# Patient Record
Sex: Male | Born: 1955 | Race: White | Hispanic: No | Marital: Married | Smoking: Former smoker
Health system: Southern US, Community
[De-identification: ages and names within clinical notes are randomized; demographics above are authoritative.]

## PROBLEM LIST (undated history)

## (undated) DIAGNOSIS — K219 Gastro-esophageal reflux disease without esophagitis: Secondary | ICD-10-CM

## (undated) DIAGNOSIS — K2270 Barrett's esophagus without dysplasia: Secondary | ICD-10-CM

## (undated) DIAGNOSIS — Z9229 Personal history of other drug therapy: Secondary | ICD-10-CM

## (undated) DIAGNOSIS — M25531 Pain in right wrist: Secondary | ICD-10-CM

## (undated) DIAGNOSIS — K432 Incisional hernia without obstruction or gangrene: Secondary | ICD-10-CM

## (undated) DIAGNOSIS — M810 Age-related osteoporosis without current pathological fracture: Secondary | ICD-10-CM

## (undated) DIAGNOSIS — F419 Anxiety disorder, unspecified: Secondary | ICD-10-CM

## (undated) DIAGNOSIS — M79672 Pain in left foot: Secondary | ICD-10-CM

## (undated) DIAGNOSIS — J302 Other seasonal allergic rhinitis: Secondary | ICD-10-CM

## (undated) DIAGNOSIS — Z8781 Personal history of (healed) traumatic fracture: Secondary | ICD-10-CM

## (undated) DIAGNOSIS — Z961 Presence of intraocular lens: Secondary | ICD-10-CM

## (undated) DIAGNOSIS — J3089 Other allergic rhinitis: Secondary | ICD-10-CM

## (undated) DIAGNOSIS — K56609 Unspecified intestinal obstruction, unspecified as to partial versus complete obstruction: Secondary | ICD-10-CM

## (undated) DIAGNOSIS — G43909 Migraine, unspecified, not intractable, without status migrainosus: Secondary | ICD-10-CM

## (undated) DIAGNOSIS — K5909 Other constipation: Secondary | ICD-10-CM

## (undated) DIAGNOSIS — Z86010 Personal history of colonic polyps: Secondary | ICD-10-CM

## (undated) DIAGNOSIS — J449 Chronic obstructive pulmonary disease, unspecified: Secondary | ICD-10-CM

## (undated) DIAGNOSIS — T7840XA Allergy, unspecified, initial encounter: Secondary | ICD-10-CM

## (undated) DIAGNOSIS — M4850XA Collapsed vertebra, not elsewhere classified, site unspecified, initial encounter for fracture: Secondary | ICD-10-CM

## (undated) DIAGNOSIS — L259 Unspecified contact dermatitis, unspecified cause: Secondary | ICD-10-CM

## (undated) DIAGNOSIS — N503 Cyst of epididymis: Secondary | ICD-10-CM

## (undated) DIAGNOSIS — E785 Hyperlipidemia, unspecified: Secondary | ICD-10-CM

## (undated) DIAGNOSIS — J45909 Unspecified asthma, uncomplicated: Secondary | ICD-10-CM

## (undated) HISTORY — DX: Hyperlipidemia, unspecified: E78.5

## (undated) HISTORY — DX: Allergy, unspecified, initial encounter: T78.40XA

## (undated) HISTORY — DX: Presence of intraocular lens: Z96.1

## (undated) HISTORY — DX: Gastro-esophageal reflux disease without esophagitis: K21.9

## (undated) HISTORY — DX: Other allergic rhinitis: J30.89

## (undated) HISTORY — DX: Cyst of epididymis: N50.3

## (undated) HISTORY — DX: Other constipation: K59.09

## (undated) HISTORY — DX: Migraine, unspecified, not intractable, without status migrainosus: G43.909

## (undated) HISTORY — DX: Age-related osteoporosis without current pathological fracture: M81.0

## (undated) HISTORY — DX: Other seasonal allergic rhinitis: J30.2

## (undated) HISTORY — PX: COLONOSCOPY W/ POLYPECTOMY: SHX1380

## (undated) HISTORY — DX: Anxiety disorder, unspecified: F41.9

## (undated) HISTORY — DX: Pain in left foot: M79.672

## (undated) HISTORY — DX: Unspecified asthma, uncomplicated: J45.909

## (undated) HISTORY — DX: Personal history of colonic polyps: Z86.010

## (undated) HISTORY — PX: ESOPHAGOGASTRODUODENOSCOPY: SHX1529

## (undated) HISTORY — PX: ABDOMINAL EXPLORATION SURGERY: SHX538

## (undated) HISTORY — DX: Unspecified contact dermatitis, unspecified cause: L25.9

## (undated) HISTORY — DX: Barrett's esophagus without dysplasia: K22.70

## (undated) HISTORY — DX: Pain in right wrist: M25.531

## (undated) HISTORY — DX: Unspecified intestinal obstruction, unspecified as to partial versus complete obstruction: K56.609

---
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
## 1999-12-19 ENCOUNTER — Ambulatory Visit (HOSPITAL_COMMUNITY): Admission: RE | Admit: 1999-12-19 | Discharge: 1999-12-19 | Payer: Self-pay

## 2004-12-09 ENCOUNTER — Ambulatory Visit: Payer: Self-pay | Admitting: Internal Medicine

## 2005-05-07 ENCOUNTER — Ambulatory Visit: Payer: Self-pay | Admitting: Internal Medicine

## 2005-07-14 ENCOUNTER — Ambulatory Visit: Payer: Self-pay | Admitting: Internal Medicine

## 2005-09-17 ENCOUNTER — Ambulatory Visit: Payer: Self-pay | Admitting: Internal Medicine

## 2006-02-04 ENCOUNTER — Ambulatory Visit: Payer: Self-pay | Admitting: Internal Medicine

## 2006-06-09 ENCOUNTER — Ambulatory Visit: Payer: Self-pay | Admitting: Internal Medicine

## 2006-07-06 ENCOUNTER — Ambulatory Visit: Payer: Self-pay | Admitting: Internal Medicine

## 2006-08-10 ENCOUNTER — Encounter (INDEPENDENT_AMBULATORY_CARE_PROVIDER_SITE_OTHER): Payer: Self-pay | Admitting: Specialist

## 2006-08-10 ENCOUNTER — Ambulatory Visit: Payer: Self-pay | Admitting: Internal Medicine

## 2006-08-10 DIAGNOSIS — Z860101 Personal history of adenomatous and serrated colon polyps: Secondary | ICD-10-CM

## 2006-08-10 DIAGNOSIS — Z86010 Personal history of colonic polyps: Secondary | ICD-10-CM

## 2006-08-10 HISTORY — DX: Personal history of colonic polyps: Z86.010

## 2006-08-10 HISTORY — DX: Personal history of adenomatous and serrated colon polyps: Z86.0101

## 2006-09-07 ENCOUNTER — Ambulatory Visit: Payer: Self-pay | Admitting: Internal Medicine

## 2006-11-01 ENCOUNTER — Ambulatory Visit: Payer: Self-pay | Admitting: Internal Medicine

## 2006-11-02 ENCOUNTER — Ambulatory Visit: Payer: Self-pay | Admitting: Internal Medicine

## 2007-01-05 ENCOUNTER — Ambulatory Visit: Payer: Self-pay | Admitting: Internal Medicine

## 2007-01-10 ENCOUNTER — Ambulatory Visit: Payer: Self-pay | Admitting: Internal Medicine

## 2007-02-01 ENCOUNTER — Ambulatory Visit: Payer: Self-pay | Admitting: Internal Medicine

## 2007-02-21 ENCOUNTER — Ambulatory Visit: Payer: Self-pay | Admitting: Internal Medicine

## 2007-03-15 ENCOUNTER — Ambulatory Visit: Payer: Self-pay | Admitting: Internal Medicine

## 2007-07-20 ENCOUNTER — Ambulatory Visit: Payer: Self-pay | Admitting: Internal Medicine

## 2007-10-10 ENCOUNTER — Ambulatory Visit: Payer: Self-pay | Admitting: Internal Medicine

## 2007-12-15 ENCOUNTER — Ambulatory Visit: Payer: Self-pay | Admitting: Internal Medicine

## 2008-02-21 DIAGNOSIS — R5383 Other fatigue: Secondary | ICD-10-CM | POA: Insufficient documentation

## 2008-02-23 ENCOUNTER — Encounter: Admission: RE | Admit: 2008-02-23 | Discharge: 2008-02-23 | Payer: Self-pay | Admitting: Family Medicine

## 2008-06-01 ENCOUNTER — Telehealth: Payer: Self-pay | Admitting: Internal Medicine

## 2008-06-04 ENCOUNTER — Telehealth: Payer: Self-pay | Admitting: Internal Medicine

## 2008-06-05 ENCOUNTER — Ambulatory Visit: Payer: Self-pay | Admitting: Internal Medicine

## 2008-06-05 DIAGNOSIS — L309 Dermatitis, unspecified: Secondary | ICD-10-CM | POA: Insufficient documentation

## 2008-06-05 DIAGNOSIS — J302 Other seasonal allergic rhinitis: Secondary | ICD-10-CM | POA: Insufficient documentation

## 2008-06-05 DIAGNOSIS — K219 Gastro-esophageal reflux disease without esophagitis: Secondary | ICD-10-CM | POA: Insufficient documentation

## 2008-06-05 DIAGNOSIS — J3089 Other allergic rhinitis: Secondary | ICD-10-CM

## 2008-06-06 ENCOUNTER — Ambulatory Visit: Payer: Self-pay | Admitting: Internal Medicine

## 2008-07-03 ENCOUNTER — Ambulatory Visit: Payer: Self-pay | Admitting: Internal Medicine

## 2008-07-20 ENCOUNTER — Encounter: Payer: Self-pay | Admitting: Internal Medicine

## 2008-07-20 ENCOUNTER — Ambulatory Visit: Payer: Self-pay | Admitting: Internal Medicine

## 2008-07-23 ENCOUNTER — Encounter: Payer: Self-pay | Admitting: Internal Medicine

## 2008-11-07 ENCOUNTER — Ambulatory Visit: Payer: Self-pay | Admitting: Internal Medicine

## 2009-05-01 ENCOUNTER — Ambulatory Visit: Payer: Self-pay | Admitting: Internal Medicine

## 2009-09-30 ENCOUNTER — Ambulatory Visit: Payer: Self-pay | Admitting: Internal Medicine

## 2009-10-22 ENCOUNTER — Ambulatory Visit: Payer: Self-pay | Admitting: Internal Medicine

## 2010-07-16 ENCOUNTER — Ambulatory Visit: Payer: Self-pay | Admitting: Internal Medicine

## 2010-09-22 ENCOUNTER — Ambulatory Visit: Payer: Self-pay | Admitting: Internal Medicine

## 2010-11-06 NOTE — Assessment & Plan Note (Signed)
Summary: 12 months/apc   CC:  12 month f/u. pt has no new concerns.  History of Present Illness: History of Present Illness: 2009 55 yo man returning for allergy f/u with hx of allergic rhinitis and eczema.Marland KitchenHe continues to give his own allergy vaccine at 1:50 without problems.No major symptom flares, but more postnasal drip during peak spring pollen.We discussed allergy vaccine risks, goals and alternatives agian, and discussed the epipen. Eczema is controlled. Denies wheezing.  October 03, 2009- Allergic rhinitis, eczema Credits a hydrating skin cream from Dr Jorja Loa with improved eczema. A sunblocker also helps. "[employer] and [employer]" sweat resistant . Does well with allergy vaccine.  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  September 22, 2010- Allergic rhinitis, eczema Nurse-CC: 12 month f/u. pt has no new concerns Misses an occasional allergy shot , but on balance has been very consistent and feels they help. He takes occasional Sudafed and dropped off Astepro. 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       Preventive Screening-Counseling & Management  Alcohol-Tobacco     Smoking Status: quit  Current Medications (verified): 1)  Advil 200 Mg Tabs (Ibuprofen) .... Take As Directed As Needed 2)  Epipen 0.3 Mg/0.60ml (1:1000) Devi (Epinephrine Hcl (Anaphylaxis)) .... For Severe Allergic Reaction 3)  Allergy Vaccine Go (W-E) 150 4)  Prevacid 30 Mg Cpdr (Lansoprazole) .Marland Kitchen.. 1 Capsule By Mouth Two Times A Day 5)  Astepro 0.15 % Soln (Azelastine Hcl) .Marland Kitchen.. 1-2 Sprays Each Nostril Up To Twice Daily As Needed  Allergies (verified): No Known Drug Allergies  Past History:  Past Medical History: Last updated: 06/05/2008 GERD (ICD-530.81) ALLERGIC RHINITIS (ICD-477.9) ECZEMA (ICD-692.9)  Past Surgical History: Last updated: 10/03/09 Right knee arthroscopy  Family History: Last updated: 10/03/2009 Father- died  chemical pneumonia siphoning gas. 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 remote  Risk Factors: Smoking Status: quit (09/22/2010)  Review of Systems      See HPI       The patient complains of shortness of breath with activity, non-productive cough, headaches, and nasal congestion/difficulty breathing through nose.  The patient denies shortness of breath at rest, productive cough, coughing up blood, chest pain, irregular heartbeats, acid heartburn, indigestion, loss of appetite, weight change, abdominal pain, difficulty swallowing, sore throat, tooth/dental problems, sneezing, itching, ear ache, rash, change in color of mucus, and fever.    Vital Signs:  Patient profile:   55 year old male Height:      70 inches Weight:      150.38 pounds BMI:     21.66 O2 Sat:      99 % on Room air Pulse rate:   109 / minute BP sitting:   144 / 84  (left arm) Cuff size:   regular  Vitals Entered By: Carver Fila (September 22, 2010 11:27 AM)  O2 Flow:  Room air CC: 12 month f/u. pt has no new concerns Comments meds and allergies updated Phone number updated Carver Fila  September 22, 2010 11:27 AM    Physical Exam  Additional Exam:  General: A/Ox3; pleasant and cooperative, NAD, thin, active, trim SKIN: Chronically eczematoid skin NODES: no lymphadenopathy HEENT: [place]/AT, EOM- WNL, Conjuctivae- clear, PERRLA, TM-WNL, Nose- clear, Throat- clear and wnl NECK: Supple w/ fair ROM, JVD- none, normal carotid impulses w/o bruits Thyroid-  CHEST: Clear to P&A HEART: RRR, no m/g/r heard  ABDOMEN: Soft and nl EAV:WUJW, nl pulses, no edema  NEURO: Grossly intact to observation      Impression & Recommendations:  Problem # 1:  ALLERGIC RHINITIS (ICD-477.9)  Discussed continuation of allergy vaccine. Compared Astepro with decongestants. Gave print information and explained use of Neti pot.  His updated  medication list for this problem includes:    Astepro 0.15 % Soln (Azelastine hcl) .Marland Kitchen... 1-2 sprays each nostril up to twice daily as needed  Problem # 2:  ECZEMA (ICD-692.9) He seems fairly well controlled with chronic and scarring changes still apparent on his face and hands. Orders: Est. Patient Level III (11914)  Problem # 3:  GERD (ICD-530.81) We cautoined maintenance of anti reflux measures to avoid complicating his asthma. His updated medication list for this problem includes:    Prevacid 30 Mg Cpdr (Lansoprazole) .Marland Kitchen... 1 capsule by mouth two times a day  Medications Added to Medication List This Visit: 1)  Allergy Vaccine Go (w-e) 1:10   Patient Instructions: 1)  Please schedule a follow-up appointment in 6 months. 2)  Continue allergy vaccine 3)  Refill Epipen 4)  Consider trying Neti pot techinque for saline rinse.  Prescriptions: EPIPEN 0.3 MG/0.3ML (1:1000) DEVI (EPINEPHRINE HCL (ANAPHYLAXIS)) For severe allergic reaction  #1 x prn   Entered and Authorized by:   Waymon Budge MD   Signed by:   Waymon Budge MD on 09/22/2010   Method used:   Print then Give to Patient   RxID:   [id number]

## 2011-02-12 ENCOUNTER — Ambulatory Visit (INDEPENDENT_AMBULATORY_CARE_PROVIDER_SITE_OTHER): Payer: Self-pay

## 2011-02-12 DIAGNOSIS — J309 Allergic rhinitis, unspecified: Secondary | ICD-10-CM

## 2011-02-17 NOTE — Assessment & Plan Note (Signed)
Jac[facility] Blanch[facility]                         GASTROENTEROLOGY OFFICE NOTE   Jacob Blanchard, Jacob Blanchard                     MRN:          [id number]  DATE:10/10/2007                            DOB:          Apr 24, 1956    HISTORY:  Jacob Blanchard presents today for followup. He has a history of  gastroesophageal reflux disease complicated by short-segment Barrett's  esophagus as well as adenomatous colon polyps. He last underwent upper  endoscopy and colonoscopy August 10, 2006. Colonoscopy revealed  diminutive colon polyps which were adenomatous. Followup recommended in  5 years. Also left-sided diverticulosis. Upper endoscopy revealed short-  segment Barrett's esophagus and a sliding hiatal hernia. He has been on  Prevacid 30 mg daily with excellent control of reflux symptoms. No  dysphagia.   CURRENT MEDICATIONS:  Allergy shots, eye drops, Prevacid, verapamil,  Depakote, melatonin.   PHYSICAL EXAMINATION:  GENERAL:  Well-appearing male in no acute  distress.  VITAL SIGNS:  Blood pressure is 120/78, heart rate is 88, weight is 157  pounds.  HEENT:  Sclera anicteric.  LUNGS:  Clear.  HEART:  Regular.  ABDOMEN:  Soft without tenderness, mass or hernia.   IMPRESSION:  1. Gastroesophageal reflux disease complicated by Barrett's esophagus.      Previous endoscopy negative for dysplasia.  2. Adenomatous colon polyps.  3. Diverticulosis.   RECOMMENDATIONS:  1. Continue Prevacid 30 mg daily. A prescription with multiple refills      has been provided.  2. Schedule upper endoscopy with surveillance biopsies. The nature of      the procedure as well as the risks, benefits, and alternatives were      reviewed. He understood and agreed to proceed.  3. Surveillance colonoscopy due in 4 years.     Jacob Blanchard. Jacob Goodell, MD  Electronically Signed    JNP/MedQ  DD: 10/10/2007  DT: 10/11/2007  Job #: [id number]

## 2011-02-20 NOTE — Assessment & Plan Note (Signed)
[name]                           GASTROENTEROLOGY OFFICE NOTE   Jacob Blanchard, Jacob Blanchard                     MRN:          [id number]  DATE:07/06/2006                            DOB:          [birth year]    REASON FOR CONSULTATION:  Belching.   HISTORY OF PRESENT ILLNESS:  This is a 55 year old white male with a history  of sinus and allergy problems who presents himself for evaluation of  belching as well as some other abdominal complaints.  The patient reports  being in his usual state of good health until July 2007 when he was having  problems with belching and chest pain.  He described recurrent episodes of  burping, as well, chest discomfort in the left shoulder that was  reproducibly improved with belching.  He was evaluated at Urgent Care,  apparently underwent evaluation including EKG.  He was told that his  complaints were noncardiac.  He was also tested for Helicobacter pylori,  which according to the patient, returned normal.  He was treated with  Prevacid for one month.  He does have chronic problems with heartburn and  indigestion for which the Prevacid helped.  However, there was no impact on  his belching.  No true dysphagia, more of a fullness in the pharyngeal area  that he thinks may be related to his allergies.  He also reports that the  Prevacid caused some constipation with associated left lower quadrant  discomfort.  He was treated with Metamucil and Glycolax.  This helped.  Though his weight has been stable, he states he has lost about 5 pounds in  the past few months.  He does chew gum on a regular basis, though, does not  use sugar substitutes in any form.  He has not had prior GI evaluations for  these problems, but states he had a sigmoidoscopy many years previous.   PAST MEDICAL HISTORY:  Sinus and allergy troubles.   PAST SURGICAL HISTORY:  Right knee surgery.   CURRENT MEDICATIONS:  1. Glycolax p.r.n.  2.  Metamucil p.r.n.   ALLERGIES:  No known drug allergies.   FAMILY HISTORY:  Negative for gastrointestinal malignancy.   SOCIAL HISTORY:  The patient is married with a 78 year old daughter.  His  wife, Jacob Blanchard, is a patient of mine.  He rarely smokes and drinks a few  beers at night.  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   REVIEW OF SYSTEMS:  Per diagnostic evaluation form.   PHYSICAL EXAMINATION:  GENERAL APPEARANCE:  Well-appearing male in no acute  distress.  VITAL SIGNS:  Blood pressure 110/66, heart rate 68 and regular, weight 143.2  pounds, 6 feet in height.  HEENT:  Sclerae are anicteric.  Conjunctivae are pink.  Oral mucosa intact.  No adenopathy  LUNGS:  Clear.  HEART:  Regular.  ABDOMEN:  Soft without tenderness, mass or hernia.  Good bowel sounds heard.  EXTREMITIES:  Without edema.   IMPRESSION:  1. Chronic belching, likely due to aerophagia.  2. Chronic reflux disease.  Symptoms were improved on proton pump  inhibitor therapy and have returned off proton pump inhibitor therapy.      No alarm symptoms.  Rule out Barrett's esophagus.  3. Constipation, improved with Metamucil and Glycolax.  4. Colon cancer screening, appropriate candidate without contraindication.   RECOMMENDATIONS:  Colonoscopy with polypectomy if necessary and upper  endoscopy.  The nature of both procedures as well as risks, benefits,  alternatives were reviewed.  He understood and agreed to proceed.            ______________________________  Wilhemina Bonito. Eda Keys., MD      JNP/MedQ  DD:  07/06/2006  DT:  07/07/2006  Job #:  [id number]

## 2011-02-20 NOTE — Assessment & Plan Note (Signed)
[facility]                             PULMONARY OFFICE NOTE   DELANEY, SCHNICK                     MRN:          [id number]  DATE:11/01/2006                            DOB:          [birth year]    PROBLEMS:  1. Eczema.  2. Allergic rhinitis.  3. Chronic reflux disease with aerophagia and belching.   HISTORY:  He continues allergy vaccine at 1:10 with no problems.  We  have reviewed risk issues, including consideration of administration  outside of a medical office, anaphylaxis and epinephrine.  He has an  epinephrine pen, which he has never needed.  He works with Dr. Jorja Loa on  his eczema and is using halobetasol cream.  He continues allergy vaccine  and says he has been on it now for years with no problems at all.  He  does not request a change and implies that it has helped.   MEDICATIONS:  1. Allergy vaccine.  2. Prevacid.  3. Halobetasol 0.05% cream.  4. P.R.N. use of Zomig, Imitrex, Sudafed, and cyclobenzaprine.   No medication allergy.   OBJECTIVE:  VITAL SIGNS:  Weight 150 pounds.  BP 138/72, pulse regular  at 100, room air saturation 100%.  SKIN:  He shows me small, reddened areas on his skin.  The skin of his  face looks mildly hyperkeratotic without peeling.  HEENT:  Nasal mucosa seems dry with crusting mucus.  There is mild  septal deviation.  No stridor.  No postnasal drip.  LUNGS:  Clear.  HEART:  Heart sounds are regular without murmur.   IMPRESSION:  Allergic rhinitis, eczema.   PLAN:  I have offered a trial of Veramyst nasal spray once each nostril  daily.  He will use Mucinex and nasal saline lavage.  Continue allergy  vaccine at 1:10 but after this length of time, we both agreed we are  going to bring him back when able for allergy retesting to make sure his  vaccine mix remains appropriate.     Clinton D. Maple Hudson, MD, Tonny Bollman, FACP  Electronically Signed   CDY/MedQ  DD: 11/03/2006  DT: 11/03/2006  Job #: [id number]

## 2011-02-20 NOTE — Assessment & Plan Note (Signed)
Jacob Blanchard                         GASTROENTEROLOGY OFFICE NOTE   Jacob Blanchard, Jacob Blanchard                     MRN:          [id number]  DATE:09/07/2006                            DOB:          23-Feb-1956    HISTORY:  Mr. Spivack present today for followup. He was evaluated  July 06, 2006 regarding belching. This is felt secondary to  aerophagia. He also reported chronic reflux disease and transient  constipation. He underwent colonoscopy for colon cancer screening and  upper endoscopy to exclude Barrett's esophagus. Colonoscopy revealed  several diminutive polyps which were removed and found to be tubular  adenomas. Also incidental sigmoid diverticulosis. Followup colonoscopy  in 5 years recommended. Upper endoscopy revealed a tongue of columnar  epithelium biopsied and confirmed to be Barrett's esophagus without  dysplasia. Also a small sliding hiatal hernia. No other abnormalities.  He was placed on Prevacid 30 mg daily. He presents today for followup.  Since that time, he reports doing better. Less belching with dietary  discretion. Does complain of some intermittent sore throat which has  actually gotten better since his last visit. No abdominal pain or  trouble with his bowels. We reviewed in detail the results of both  procedures as well as the implications of the pathologic findings.   CURRENT MEDICATIONS:  Allergy shots and Prevacid.   PHYSICAL EXAMINATION:  GENERAL:  A well-appearing male in no acute  distress.  VITAL SIGNS:  Blood pressure 130/80, heart rate is 80, weight is 149.2  pounds.  ABDOMEN:  Not reexamined.   IMPRESSION:  1. Gastroesophageal reflux disease with short segment Barrett's      esophagus. Currently asymptomatic on Prevacid.  2. Adenomatous colon polyps status post polypectomy.  3. Incidental diverticulosis.   RECOMMENDATIONS:  1. Continue Prevacid.  2. Surveillance endoscopy in 1 year.  3. Surveillance  colonoscopy in 5 years.  4. Ongoing general medical care with [facility].     Wilhemina Bonito. Marina Goodell, MD  Electronically Signed    JNP/MedQ  DD: 09/07/2006  DT: 09/08/2006  Job #: [id number]

## 2011-06-03 ENCOUNTER — Other Ambulatory Visit: Payer: Self-pay | Admitting: Internal Medicine

## 2011-06-09 MED ORDER — LANSOPRAZOLE 30 MG PO CPDR: 30.0000 mg | DELAYED_RELEASE_CAPSULE | Freq: Two times a day (BID) | ORAL | Status: DC

## 2011-06-09 NOTE — Telephone Encounter (Signed)
Refill sent  Advised patient needs appointment with Dr. Marina Goodell for his Barrett's.

## 2011-06-25 ENCOUNTER — Ambulatory Visit (INDEPENDENT_AMBULATORY_CARE_PROVIDER_SITE_OTHER): Payer: BC Managed Care – PPO

## 2011-06-25 DIAGNOSIS — J309 Allergic rhinitis, unspecified: Secondary | ICD-10-CM

## 2011-07-03 ENCOUNTER — Encounter: Payer: Self-pay | Admitting: Internal Medicine

## 2011-09-21 ENCOUNTER — Encounter: Payer: Self-pay | Admitting: Internal Medicine

## 2011-09-22 ENCOUNTER — Encounter: Payer: Self-pay | Admitting: Internal Medicine

## 2011-09-22 ENCOUNTER — Ambulatory Visit (INDEPENDENT_AMBULATORY_CARE_PROVIDER_SITE_OTHER): Payer: BC Managed Care – PPO | Admitting: Internal Medicine

## 2011-09-22 VITALS — BP 118/76 | HR 102 | Ht 70.0 in | Wt 157.0 lb

## 2011-09-22 DIAGNOSIS — J309 Allergic rhinitis, unspecified: Secondary | ICD-10-CM

## 2011-09-22 DIAGNOSIS — L259 Unspecified contact dermatitis, unspecified cause: Secondary | ICD-10-CM

## 2011-09-22 MED ORDER — EPINEPHRINE 0.3 MG/0.3ML IJ DEVI: 0.3000 mg | Freq: Once | INTRAMUSCULAR | Status: DC

## 2011-09-22 MED ORDER — HALOBETASOL PROPIONATE 0.05 % EX CREA: TOPICAL_CREAM | Freq: Two times a day (BID) | CUTANEOUS | Status: AC

## 2011-09-22 NOTE — Patient Instructions (Signed)
Refills for halobetasol and for Epipen sent  Continue allergy vaccine

## 2011-09-22 NOTE — Progress Notes (Signed)
09/22/11- 55 yom former smoker followed for allergic rhinitis, eczema, complicated by GERD LOV-09/22/10 He continues allergy vaccine faithfully and still finds that if he misses, he will notice nasal congestion in a week. Needs refill Epipen but has never had significant reaction. He has no concerns with current management. Declines flu vaccine.  Eczema is controlled with occasional spot therapy with topical steroid ointment. He knows not to over use this, and hasn't needed to.   ROS-see HPI Constitutional:   No-   weight loss, night sweats, fevers, chills, fatigue, lassitude. HEENT:   No-  headaches, difficulty swallowing, tooth/dental problems, sore throat,       +Occasional mild sneezing, itching, ear ache, nasal congestion, post nasal drip,  CV:  No-   chest pain, orthopnea, PND, swelling in lower extremities, anasarca,                                  dizziness, palpitations Resp: No-   shortness of breath with exertion or at rest.              No-   productive cough,  No non-productive cough,  No- coughing up of blood.              No-   change in color of mucus.  No- wheezing.   Skin: No-   rash or lesions. GI:  No-   heartburn, indigestion, abdominal pain, nausea, vomiting, diarrhea,                 change in bowel habits, loss of appetite GU: MS:  Neuro-      Psych:   OBJ General- Alert, Oriented, Affect-appropriate, Distress- none acute Skin- rash-none, lesions- none, excoriation- none. Old acne scarring. Shows 2 cm area of dry scale on wrist. Lymphadenopathy- none Head- atraumatic            Eyes- Gross vision intact, PERRLA, conjunctivae clear secretions            Ears- Hearing, canals-normal            Nose- Clear, no-Septal dev, mucus, polyps, erosion, perforation             Throat- Mallampati II , mucosa clear , drainage- none, tonsils- atrophic Neck- flexible , trachea midline, no stridor , thyroid nl, carotid no bruit Chest - symmetrical excursion , unlabored     Heart/CV- RRR , no murmur , no gallop  , no rub, nl s1 s2                           - JVD- none , edema- none, stasis changes- none, varices- none           Lung- clear to P&A, wheeze- none, cough- none , dullness-none, rub- none           Chest wall-  Abd- Br/ Gen/ Rectal- Not done, not indicated Extrem- cyanosis- none, clubbing, none, atrophy- none, strength- nl Neuro- grossly intact to observation

## 2011-09-24 NOTE — Assessment & Plan Note (Signed)
Appropriate use of halogenated steroid oint for limited use.

## 2011-09-24 NOTE — Assessment & Plan Note (Signed)
He is satisfied that allergy vaccine remains a useful tool for him. We reviewed risks and goals.

## 2011-10-12 ENCOUNTER — Other Ambulatory Visit: Payer: Self-pay | Admitting: Internal Medicine

## 2011-10-19 ENCOUNTER — Encounter: Payer: Self-pay | Admitting: Internal Medicine

## 2011-10-26 ENCOUNTER — Other Ambulatory Visit: Payer: Self-pay

## 2011-10-26 ENCOUNTER — Other Ambulatory Visit: Payer: Self-pay | Admitting: Internal Medicine

## 2011-10-26 MED ORDER — LANSOPRAZOLE 30 MG PO CPDR: 30.0000 mg | DELAYED_RELEASE_CAPSULE | Freq: Two times a day (BID) | ORAL | Status: DC

## 2011-11-12 ENCOUNTER — Ambulatory Visit (INDEPENDENT_AMBULATORY_CARE_PROVIDER_SITE_OTHER): Payer: BC Managed Care – PPO | Admitting: Internal Medicine

## 2011-11-12 ENCOUNTER — Encounter: Payer: Self-pay | Admitting: Internal Medicine

## 2011-11-12 VITALS — BP 122/80 | HR 101 | Ht 70.0 in | Wt 154.2 lb

## 2011-11-12 DIAGNOSIS — K219 Gastro-esophageal reflux disease without esophagitis: Secondary | ICD-10-CM

## 2011-11-12 DIAGNOSIS — Z86010 Personal history of colonic polyps: Secondary | ICD-10-CM

## 2011-11-12 DIAGNOSIS — K2270 Barrett's esophagus without dysplasia: Secondary | ICD-10-CM

## 2011-11-12 MED ORDER — LANSOPRAZOLE 30 MG PO CPDR: 30.0000 mg | DELAYED_RELEASE_CAPSULE | Freq: Two times a day (BID) | ORAL | Status: DC

## 2011-11-12 MED ORDER — PEG-KCL-NACL-NASULF-NA ASC-C 100 G PO SOLR: 1.0000 | ORAL | Status: DC

## 2011-11-12 NOTE — Telephone Encounter (Signed)
Medication refilled, see OV note.

## 2011-11-12 NOTE — Progress Notes (Signed)
HISTORY OF PRESENT ILLNESS:  Jacob Blanchard is a 56 y.o. male with a history of gastroesophageal reflux disease complicated by short segment Barrett's esophagus, and adenomatous colon polyps. He last underwent upper endoscopy with surveillance biopsies in October of 2009. No dysplasia. Follow up in 3 years recommended. His last colonoscopy was performed November 2007. Diminutive adenomas removed. Followup in 5 years recommended. He has not been seen since October 2009. He continues on Prevacid 30 mg twice a day. On medication, good control of reflux symptoms. No dysphagia. Off medication, incapacitating GERD symptoms. His GI review of systems is otherwise negative. He's had no interval medical problems or hospitalizations. No appreciable medication side effects.  REVIEW OF SYSTEMS:  All non-GI ROS reviewed and reported to be entirely negative  Past Medical History  Diagnosis Date  . Esophageal reflux   . Allergic rhinitis, cause unspecified   . Contact dermatitis and other eczema, due to unspecified cause     Past Surgical History  Procedure Date  . Right knee arthroscopy     Social History Rease [employer]  reports that he has quit smoking. He has never used smokeless tobacco. He reports that he drinks alcohol. He reports that he does not use illicit drugs.  family history includes Other in his father.  No Known Allergies     PHYSICAL EXAMINATION: Vital signs: BP 122/80  Pulse 101  Ht 5\' 10"  (1.778 m)  Wt 154 lb 3.2 oz (69.945 kg)  BMI 22.13 kg/m2  SpO2 98%  Constitutional: generally well-appearing, no acute distress Psychiatric: alert and oriented x3, cooperative Eyes: extraocular movements intact, anicteric, conjunctiva pink Mouth: oral pharynx moist, no lesions Neck: supple no lymphadenopathy Cardiovascular: heart regular rate and rhythm, no murmur Lungs: clear to auscultation bilaterally Abdomen: soft, nontender, nondistended, no obvious ascites, no peritoneal signs,  normal bowel sounds, no organomegaly Rectal:deferred until colonoscopy Extremities: no lower extremity edema bilaterally Skin: no lesions on visible extremities Neuro: No focal deficits.   ASSESSMENT:  #1. GERD. Requires twice a day PPI for control of symptoms #2. Short segment Barrett's esophagus without dysplasia. Last upper endoscopy October 2009. Due for surveillance endoscopy currently #3. History of adenomatous colon polyps November 2007. Due for surveillance colonoscopy   PLAN:  #1. Reflux precautions #2. Continue twice a day Prevacid. Prescription refilled #3. Surveillance colonoscopy and upper endoscopy with biopsies.The nature of the procedure, as well as the risks, benefits, and alternatives were carefully and thoroughly reviewed with the patient. Ample time for discussion and questions allowed. The patient understood, was satisfied, and agreed to proceed.  #4. Movi prep prescribed. The patient instructed on its use

## 2011-11-12 NOTE — Patient Instructions (Addendum)
You will call back and schedule your Endoscopy and a Colonoscopy, you will need a previsit scheduled as well. Your Prevacid has been sent to your pharmacy.    Pt pharmacy was called and prescription was cx for movi prep pt will call back to schedule and previsit will send

## 2011-11-17 ENCOUNTER — Ambulatory Visit (INDEPENDENT_AMBULATORY_CARE_PROVIDER_SITE_OTHER): Payer: BC Managed Care – PPO

## 2011-11-17 DIAGNOSIS — J309 Allergic rhinitis, unspecified: Secondary | ICD-10-CM

## 2011-11-23 ENCOUNTER — Other Ambulatory Visit: Payer: Self-pay | Admitting: Internal Medicine

## 2011-11-30 ENCOUNTER — Other Ambulatory Visit: Payer: Self-pay

## 2011-11-30 ENCOUNTER — Telehealth: Payer: Self-pay | Admitting: Internal Medicine

## 2011-11-30 MED ORDER — LANSOPRAZOLE 30 MG PO CPDR: 30.0000 mg | DELAYED_RELEASE_CAPSULE | Freq: Two times a day (BID) | ORAL | Status: DC

## 2011-12-24 NOTE — Telephone Encounter (Signed)
She previous note

## 2012-04-26 ENCOUNTER — Ambulatory Visit (INDEPENDENT_AMBULATORY_CARE_PROVIDER_SITE_OTHER): Payer: BC Managed Care – PPO

## 2012-04-26 DIAGNOSIS — J309 Allergic rhinitis, unspecified: Secondary | ICD-10-CM

## 2012-07-01 ENCOUNTER — Encounter: Payer: Self-pay | Admitting: Internal Medicine

## 2012-07-04 ENCOUNTER — Encounter: Payer: Self-pay | Admitting: Internal Medicine

## 2012-09-16 ENCOUNTER — Ambulatory Visit (INDEPENDENT_AMBULATORY_CARE_PROVIDER_SITE_OTHER): Payer: BC Managed Care – PPO

## 2012-09-16 DIAGNOSIS — J309 Allergic rhinitis, unspecified: Secondary | ICD-10-CM

## 2012-09-20 ENCOUNTER — Encounter: Payer: Self-pay | Admitting: Internal Medicine

## 2012-09-20 ENCOUNTER — Ambulatory Visit (INDEPENDENT_AMBULATORY_CARE_PROVIDER_SITE_OTHER): Payer: BC Managed Care – PPO | Admitting: Internal Medicine

## 2012-09-20 ENCOUNTER — Ambulatory Visit (INDEPENDENT_AMBULATORY_CARE_PROVIDER_SITE_OTHER)
Admission: RE | Admit: 2012-09-20 | Discharge: 2012-09-20 | Disposition: A | Discharge status: Home / Self Care | Payer: BC Managed Care – PPO | Source: Ambulatory Visit | Attending: Internal Medicine | Admitting: Internal Medicine

## 2012-09-20 VITALS — BP 120/68 | HR 102 | Ht 70.0 in | Wt 156.0 lb

## 2012-09-20 DIAGNOSIS — J309 Allergic rhinitis, unspecified: Secondary | ICD-10-CM

## 2012-09-20 DIAGNOSIS — J45909 Unspecified asthma, uncomplicated: Secondary | ICD-10-CM

## 2012-09-20 DIAGNOSIS — L259 Unspecified contact dermatitis, unspecified cause: Secondary | ICD-10-CM

## 2012-09-20 DIAGNOSIS — J302 Other seasonal allergic rhinitis: Secondary | ICD-10-CM

## 2012-09-20 MED ORDER — EPINEPHRINE 0.3 MG/0.3ML IJ DEVI: 0.3000 mg | Freq: Once | INTRAMUSCULAR | Status: AC

## 2012-09-20 MED ORDER — AZELASTINE HCL 0.1 % NA SOLN: 1.0000 | Freq: Two times a day (BID) | NASAL | Status: DC

## 2012-09-20 NOTE — Patient Instructions (Addendum)
Try dabbing Milk of Magnesia on your eczema areas twice daily for awhile  Order CXR   Dx asthma  Refill Epipen script  Refill Astelin nasal spray

## 2012-09-20 NOTE — Progress Notes (Signed)
09/22/11- 55 yom former smoker followed for allergic rhinitis, eczema, complicated by GERD LOV-09/22/10 He continues allergy vaccine faithfully and still finds that if he misses, he will notice nasal congestion in a week. Needs refill Epipen but has never had significant reaction. He has no concerns with current management. Declines flu vaccine.  Eczema is controlled with occasional spot therapy with topical steroid ointment. He knows not to over use this, and hasn't needed to.   12/117/13- 56 yom former smoker followed for allergic rhinitis, eczema, complicated by GERD FOLLOWS FOR: still on vaccine and doing well; no flare ups Doing "great". No coughing chest feels clear. He does not pay attention to minimal wheeze and has had no serious exacerbations. He continues allergy vaccine 1:10 GO and believes it helps him. We discussed goals and duration of therapy and we discussed risk and EpiPen. Eczema has been under poor control. He has been using Neosporin ointment with topical steroid. We discussed options.  ROS-see HPI Constitutional:   No-   weight loss, night sweats, fevers, chills, fatigue, lassitude. HEENT:   No-  headaches, difficulty swallowing, tooth/dental problems, sore throat,       No-  sneezing, itching, ear ache, nasal congestion, post nasal drip,  CV:  No-   chest pain, orthopnea, PND, swelling in lower extremities, anasarca,  dizziness, palpitations Resp: No- acute  shortness of breath with exertion or at rest.              No-   productive cough,  No non-productive cough,  No- coughing up of blood.              No-   change in color of mucus.  No-severe wheezing.   Skin: + Eczema GI:  No-   heartburn, indigestion, abdominal pain, nausea, vomiting,  GU:  MS:  No-   joint pain or swelling.  . Neuro-     nothing unusual Psych:  No- change in mood or affect. No depression or anxiety.  No memory loss.  OBJ General- Alert, Oriented, Affect-appropriate, Distress- none acute Skin-  rash-none, lesions- none, excoriation- none. Old acne scarring. Shows 2 cm area of dry scale on wrist. Lymphadenopathy- none Head- atraumatic            Eyes- Gross vision intact, PERRLA, conjunctivae clear secretions            Ears- Hearing, canals-normal            Nose- Clear, no-Septal dev, mucus, polyps, erosion, perforation             Throat- Mallampati II , mucosa clear , drainage- none, tonsils- atrophic Neck- flexible , trachea midline, no stridor , thyroid nl, carotid no bruit Chest - symmetrical excursion , unlabored           Heart/CV- RRR , no murmur , no gallop  , no rub, nl s1 s2                           - JVD- none , edema- none, stasis changes- none, varices- none           Lung- clear to P&A, wheeze+ slow end expiratory trace wheeze, cough- none , dullness-none, rub- none           Chest wall-  Abd- Br/ Gen/ Rectal- Not done, not indicated Extrem- cyanosis- none, clubbing, none, atrophy- none, strength- nl Neuro- grossly intact to observation

## 2012-10-03 DIAGNOSIS — J45909 Unspecified asthma, uncomplicated: Secondary | ICD-10-CM | POA: Insufficient documentation

## 2012-10-03 NOTE — Assessment & Plan Note (Signed)
Significant long term problem with chronic scarring. Plan-we discussed alternative medicine strategy of using topical milk of magnesia. He is going to try it, hoping to reduce topical steroid use.

## 2012-10-03 NOTE — Assessment & Plan Note (Signed)
Mild end expiratory wheeze consistent with some degree of obstructive airways disease. Former smoker. We may need to update PFT

## 2012-10-03 NOTE — Assessment & Plan Note (Signed)
We discussed long-term goals and risk and benefit considerations of allergy vaccine therapy. He is satisfied that helps him.

## 2012-10-07 ENCOUNTER — Telehealth: Payer: Self-pay | Admitting: Internal Medicine

## 2012-10-07 NOTE — Telephone Encounter (Signed)
Notes Recorded by Waymon Budge, MD on 09/20/2012 at 7:39 PM CXR- minimal asthma changes  Spoke with pt and notified of results per Dr. Maple Hudson. Pt verbalized understanding and denied any questions.

## 2012-10-07 NOTE — Progress Notes (Signed)
Quick Note:  Spoke with pt and notified of results per Dr. Young. Pt verbalized understanding and denied any questions.  ______ 

## 2012-10-07 NOTE — Progress Notes (Signed)
Quick Note:  ATC patient, no answer LMOMTCB ______ 

## 2012-11-03 ENCOUNTER — Other Ambulatory Visit: Payer: Self-pay | Admitting: Internal Medicine

## 2013-02-08 ENCOUNTER — Ambulatory Visit (INDEPENDENT_AMBULATORY_CARE_PROVIDER_SITE_OTHER): Payer: BC Managed Care – PPO

## 2013-02-08 DIAGNOSIS — J309 Allergic rhinitis, unspecified: Secondary | ICD-10-CM

## 2013-05-29 ENCOUNTER — Other Ambulatory Visit: Payer: Self-pay | Admitting: Internal Medicine

## 2013-06-23 ENCOUNTER — Ambulatory Visit (INDEPENDENT_AMBULATORY_CARE_PROVIDER_SITE_OTHER): Payer: BC Managed Care – PPO

## 2013-06-23 DIAGNOSIS — J309 Allergic rhinitis, unspecified: Secondary | ICD-10-CM

## 2013-07-06 ENCOUNTER — Encounter: Payer: Self-pay | Admitting: Internal Medicine

## 2013-07-06 ENCOUNTER — Ambulatory Visit (INDEPENDENT_AMBULATORY_CARE_PROVIDER_SITE_OTHER): Payer: BC Managed Care – PPO | Admitting: Internal Medicine

## 2013-07-06 ENCOUNTER — Other Ambulatory Visit: Payer: BC Managed Care – PPO

## 2013-07-06 VITALS — BP 130/62 | HR 97 | Ht 70.0 in | Wt 152.8 lb

## 2013-07-06 DIAGNOSIS — R21 Rash and other nonspecific skin eruption: Secondary | ICD-10-CM

## 2013-07-06 DIAGNOSIS — L259 Unspecified contact dermatitis, unspecified cause: Secondary | ICD-10-CM

## 2013-07-06 DIAGNOSIS — J302 Other seasonal allergic rhinitis: Secondary | ICD-10-CM

## 2013-07-06 DIAGNOSIS — J309 Allergic rhinitis, unspecified: Secondary | ICD-10-CM

## 2013-07-06 NOTE — Patient Instructions (Signed)
Order- Food IgE allergy panel        Dx rash  Try a 24 hour, non-sedating antihistamine like Claritin/ loratadine or Allegra/ fexofenadine  See if this stops the itching  It may be worthwhile to consider changing bath and laundry products to hypoallergeninc- like Dreft or [place] snow, using [facility] as a Psychologist, clinical.

## 2013-07-06 NOTE — Progress Notes (Signed)
09/22/11- 55 yom former smoker followed for allergic rhinitis, eczema, complicated by GERD LOV-09/22/10 He continues allergy vaccine faithfully and still finds that if he misses, he will notice nasal congestion in a week. Needs refill Epipen but has never had significant reaction. He has no concerns with current management. Declines flu vaccine.  Eczema is controlled with occasional spot therapy with topical steroid ointment. He knows not to over use this, and hasn't needed to.   09/20/12- 56 yom former smoker followed for allergic rhinitis, eczema, complicated by GERD FOLLOWS FOR: still on vaccine and doing well; no flare ups Doing "great". No coughing chest feels clear. He does not pay attention to minimal wheeze and has had no serious exacerbations. He continues allergy vaccine 1:10 GO and believes it helps him. We discussed goals and duration of therapy and we discussed risk and EpiPen. Eczema has been under poor control. He has been using Neosporin ointment with topical steroid. We discussed options.  07/05/13- 56 yom former smoker followed for allergic rhinitis, eczema, complicated by GERD FOLLOWS FOR: still on Allergy vaccine 1:10 GO and doing well; about 3-4 weeks ago started having itching spells at night and using Benadryl; has dandruff type in his head and rash on skin. Declines flu vaccine despite discussion Has already tried Head and Shoulders and Selsun Blue shampoos for his dandruff and rash Was treated in urgent care for this with Depo-Medrol and prednisone which gave only temporary help. CXR 09/20/12 IMPRESSION:  History given of asthma.  There is minimal central peribronchial thickening. There is  borderline hyperinflation configuration. No peripheral infiltrates  are seen.  Original Report Authenticated By: Onalee Hua Call  ROS-see HPI Constitutional:   No-   weight loss, night sweats, fevers, chills, fatigue, lassitude. HEENT:   No-  headaches, difficulty swallowing,  tooth/dental problems, sore throat,       No-  sneezing, itching, ear ache, nasal congestion, post nasal drip,  CV:  No-   chest pain, orthopnea, PND, swelling in lower extremities, anasarca,  dizziness, palpitations Resp: No- acute  shortness of breath with exertion or at rest.              No-   productive cough,  No non-productive cough,  No- coughing up of blood.              No-   change in color of mucus.  No-severe wheezing.   Skin: + Eczema GI:  No-   heartburn, indigestion, abdominal pain, nausea, vomiting,  GU:  MS:  No-   joint pain or swelling.  . Neuro-     nothing unusual Psych:  No- change in mood or affect. No depression or anxiety.  No memory loss.  OBJ General- Alert, Oriented, Affect-appropriate, Distress- none acute Skin-  lesions- none, excoriation- none. Old acne scarring. +Shows 2 cm area of dry scale on wrist. Lymphadenopathy- none Head- atraumatic            Eyes- Gross vision intact, PERRLA, conjunctivae clear secretions            Ears- Hearing, canals-normal            Nose- Clear, no-Septal dev, mucus, polyps, erosion, perforation             Throat- Mallampati II , mucosa clear , drainage- none, tonsils- atrophic Neck- flexible , trachea midline, no stridor , thyroid nl, carotid no bruit Chest - symmetrical excursion , unlabored  Heart/CV- RRR , no murmur , no gallop  , no rub, nl s1 s2                           - JVD- none , edema- none, stasis changes- none, varices- none           Lung- clear to P&A, wheeze-none, cough- none , dullness-none, rub- none           Chest wall-  Abd- Br/ Gen/ Rectal- Not done, not indicated Extrem- cyanosis- none, clubbing, none, atrophy- none, strength- nl Neuro- grossly intact to observation

## 2013-07-07 LAB — ALLERGEN FOOD PROFILE SPECIFIC IGE
Apple: <0.1 kU/L
Corn: <0.1 kU/L
Fish Cod: <0.1 kU/L
IgE (Immunoglobulin E), Serum: 12 IU/mL (ref 0.0–180.0)
Peanut IgE: <0.1 kU/L
Soybean IgE: <0.1 kU/L

## 2013-07-10 NOTE — Progress Notes (Signed)
Quick Note:  Advised pt of labs per CY. Pt verbalized understanding and has no further questions at this time ______ 

## 2013-07-16 NOTE — Assessment & Plan Note (Signed)
Rash and flaking skin looked like eczema on the small area he shows me. We would be a little unusual for this to develop diffusely across his trunk. Plan-antihistamine of choice as needed. Food allergy IgE profile

## 2013-07-16 NOTE — Assessment & Plan Note (Signed)
Good control. He is satisfied to continue allergy vaccine. We will update database while investigating his rash Plan-food allergy IgE panel

## 2013-07-31 ENCOUNTER — Telehealth: Payer: Self-pay | Admitting: Internal Medicine

## 2013-07-31 NOTE — Telephone Encounter (Signed)
This may not be an allergy process at all, but ok to schedule him for allergy skin tests, off antihistamines

## 2013-07-31 NOTE — Telephone Encounter (Signed)
I spoke with pt and made him aware. He reports he will call us back later when he is ready to schedule.

## 2013-07-31 NOTE — Telephone Encounter (Signed)
Pt was seen 07/06/13 for rash. He reports he has tried everything Dr. Maple Hudson recommend. He reports he is still itching a lot. He stopped advil and changed dryer sheets. He is not sure what else to do. He thinks maybe he needs to be retested for allergy shots. Please advise Dr. Maple Hudson thanks Last OV 07/06/13 Pending 07/10/14 No Known Allergies

## 2013-08-28 ENCOUNTER — Other Ambulatory Visit: Payer: Self-pay | Admitting: Physician Assistant

## 2013-09-18 ENCOUNTER — Ambulatory Visit: Payer: BC Managed Care – PPO | Admitting: Internal Medicine

## 2014-07-10 ENCOUNTER — Ambulatory Visit: Payer: BC Managed Care – PPO | Admitting: Internal Medicine

## 2014-08-08 ENCOUNTER — Encounter: Payer: Self-pay | Admitting: Internal Medicine

## 2015-03-26 ENCOUNTER — Telehealth: Payer: Self-pay | Admitting: Internal Medicine

## 2015-03-26 NOTE — Telephone Encounter (Signed)
He switched to Dr Neldon Mc and dc'd allergy vaccine since last here

## 2015-03-26 NOTE — Telephone Encounter (Signed)
In your 07/06/13 ov notes you typed:Good control. He is satisfied to cont. All. Vac..Last vac. 06/23/13. Since it is not documented in his chart I wanted you to know.

## 2015-03-27 NOTE — Telephone Encounter (Signed)
Noted  

## 2015-04-19 HISTORY — PX: OTHER SURGICAL HISTORY: SHX169

## 2015-04-24 ENCOUNTER — Ambulatory Visit (INDEPENDENT_AMBULATORY_CARE_PROVIDER_SITE_OTHER): Payer: BLUE CROSS/BLUE SHIELD | Admitting: Family Medicine

## 2015-04-24 ENCOUNTER — Encounter: Payer: Self-pay | Admitting: Family Medicine

## 2015-04-24 VITALS — BP 118/84 | HR 90 | Temp 97.7°F | Resp 16 | Ht 69.75 in | Wt 153.0 lb

## 2015-04-24 DIAGNOSIS — Z0000 Encounter for general adult medical examination without abnormal findings: Secondary | ICD-10-CM

## 2015-04-24 DIAGNOSIS — K2100 Gastro-esophageal reflux disease with esophagitis, without bleeding: Secondary | ICD-10-CM

## 2015-04-24 DIAGNOSIS — Z86010 Personal history of colonic polyps: Secondary | ICD-10-CM

## 2015-04-24 DIAGNOSIS — Z8719 Personal history of other diseases of the digestive system: Secondary | ICD-10-CM

## 2015-04-24 DIAGNOSIS — Z13220 Encounter for screening for lipoid disorders: Secondary | ICD-10-CM

## 2015-04-24 LAB — LIPID PANEL
Cholesterol: 280 mg/dL — ABNORMAL HIGH (ref 0–200)
HDL: 46.8 mg/dL (ref >39.00)
LDL CALC: 206 mg/dL — AB (ref 0–99)
NONHDL: 233.2
TRIGLYCERIDES: 135 mg/dL (ref 0.0–149.0)
Total CHOL/HDL Ratio: 6
VLDL: 27 mg/dL (ref 0.0–40.0)

## 2015-04-24 LAB — CBC WITH DIFFERENTIAL/PLATELET
Basophils Absolute: 0 10*3/uL (ref 0.0–0.1)
Basophils Relative: 0.4 % (ref 0.0–3.0)
EOS ABS: 0.1 10*3/uL (ref 0.0–0.7)
EOS PCT: 1.6 % (ref 0.0–5.0)
HEMATOCRIT: 47.8 % (ref 39.0–52.0)
Hemoglobin: 15.9 g/dL (ref 13.0–17.0)
Lymphocytes Relative: 25.3 % (ref 12.0–46.0)
Lymphs Abs: 1.3 10*3/uL (ref 0.7–4.0)
MCHC: 33.3 g/dL (ref 30.0–36.0)
MCV: 95.7 fl (ref 78.0–100.0)
MONO ABS: 0.7 10*3/uL (ref 0.1–1.0)
Monocytes Relative: 13.2 % — ABNORMAL HIGH (ref 3.0–12.0)
NEUTROS PCT: 59.5 % (ref 43.0–77.0)
Neutro Abs: 3 10*3/uL (ref 1.4–7.7)
Platelets: 373 10*3/uL (ref 150.0–400.0)
RBC: 5 Mil/uL (ref 4.22–5.81)
RDW: 14.3 % (ref 11.5–15.5)
WBC: 5 10*3/uL (ref 4.0–10.5)

## 2015-04-24 LAB — COMPREHENSIVE METABOLIC PANEL
ALBUMIN: 4.4 g/dL (ref 3.5–5.2)
ALT: 21 U/L (ref 0–53)
AST: 19 U/L (ref 0–37)
Alkaline Phosphatase: 69 U/L (ref 39–117)
BUN: 11 mg/dL (ref 6–23)
CO2: 30 meq/L (ref 19–32)
CREATININE: 0.88 mg/dL (ref 0.40–1.50)
Calcium: 9.8 mg/dL (ref 8.4–10.5)
Chloride: 103 mEq/L (ref 96–112)
GFR: 93.69 mL/min (ref >60.00)
GLUCOSE: 85 mg/dL (ref 70–99)
POTASSIUM: 4.9 meq/L (ref 3.5–5.1)
Sodium: 138 mEq/L (ref 135–145)
TOTAL PROTEIN: 7 g/dL (ref 6.0–8.3)
Total Bilirubin: 0.7 mg/dL (ref 0.2–1.2)

## 2015-04-24 LAB — TSH: TSH: 1.56 u[IU]/mL (ref 0.35–4.50)

## 2015-04-24 MED ORDER — PANTOPRAZOLE SODIUM 40 MG PO TBEC: 40.0000 mg | DELAYED_RELEASE_TABLET | Freq: Two times a day (BID) | ORAL | Status: DC

## 2015-04-24 NOTE — Patient Instructions (Signed)
Take tums (OTC) 2-3 times a day as needed for burping/indigestion.  Follow anti-reflux diet.

## 2015-04-24 NOTE — Progress Notes (Signed)
Pre visit review using our clinic review tool, if applicable. No additional management support is needed unless otherwise documented below in the visit note. 

## 2015-04-24 NOTE — Progress Notes (Signed)
Office Note 04/24/2015  CC:  Chief Complaint  Patient presents with  . Establish Care  . Hospitalization Follow-up    [facility] in [place] ER on 04/19/15    HPI:  Jacob Blanchard is a 59 y.o. White male who is here to establish care. Patient's most recent primary MD: none (most recent was Dr. Ernie Hew). Old records in EPIC/HL EMR were reviewed prior to or during today's visit.  [facility] [place] recently and had epigastric pain/burping, pain in chest and left axillary region.  Was not having ACS, dx'd with severe GERD. He was only taking lanzoprazole 30mg  qd and was supposed to be on it 30mg  bid.  Taking gas-ex 1 tab each meal. He currently takes 15 mg OTC lanzoprazole qid since recent ED visit.Marland Kitchen   He doesn't eat a good anti GERD diet, doesn't elevate head of bed.  He [facility], is weening off verapamil, says after he weens off he usually gets a couple years of being HA-free.   Past Medical History  Diagnosis Date  . GERD (gastroesophageal reflux disease)   . Seasonal and perennial allergic rhinitis     vaccine/injection therapy helpful  . Contact dermatitis and other eczema, due to unspecified cause     +scarring  . Chronic asthma   . Barrett's esophagus     Bx + 2009 and 2007  . History of adenomatous polyp of colon 08/10/06  . Migraine syndrome     verapamil prophylaxis    Past Surgical History  Procedure Laterality Date  . Right knee arthroscopy  1980s    Torn meniscus (Dr. Noemi Chapel)  . Esophagogastroduodenoscopy  2007 and 07/20/08    GERD, BARRET's esoph (Dr. Henrene Pastor)  . Colonoscopy w/ polypectomy  08/10/06    Several adenomatous polyps    Family History  Problem Relation Age of Onset  . Other Father     chemical PNA siphoning gas    History   Social History  . Marital Status: Married    Spouse Name: N/A  . Number of Children: 1  . Years of Education: N/A   Occupational History  . Auto body  paint product-sells   .  [employer]   Social History Main Topics  . Smoking status: Former Smoker -- 1.00 packs/day for 10 years    Types: Cigarettes    Quit date: 07/06/1993  . Smokeless tobacco: Never Used  . Alcohol Use: Yes     Comment: 1 or 2   . Drug Use: No  . Sexual Activity: Not on file   Other Topics Concern  . Not on file   Social History Narrative   Married, one daughter.   Educ: [employer]   Occup: Biochemist, clinical for [employer].    No tobacco, 2 beers a night, no hx of problem drinking/drug use.   Caffeine: 2-3 cups coffee every morning.  Tea x 2 later in day.    Outpatient Encounter Prescriptions as of 04/24/2015  Medication Sig  . Ibuprofen (ADVIL) 200 MG CAPS Take by mouth.    . lansoprazole (PREVACID) 30 MG capsule TAKE 1 CAPSULE (30 MG TOTAL) BY MOUTH 2 (TWO) TIMES DAILY.  . verapamil (CALAN) 120 MG tablet Take 1 tablet by mouth Three times a day.  . pantoprazole (PROTONIX) 40 MG tablet Take 1 tablet (40 mg total) by mouth 2 (two) times daily.  . [DISCONTINUED] azelastine (ASTEPRO) 137 MCG/SPRAY nasal spray Place 1 spray into  the nose 2 (two) times daily. Use in each nostril as directed  . [DISCONTINUED] lansoprazole (PREVACID) 30 MG capsule TAKE 1 CAPSULE (30 MG TOTAL) BY MOUTH 2 (TWO) TIMES DAILY. (Patient not taking: Reported on 04/24/2015)   No facility-administered encounter medications on file as of 04/24/2015.    No Known Allergies  ROS Review of Systems  Constitutional: Negative for fever and fatigue.  HENT: Negative for congestion and sore throat.   Eyes: Negative for visual disturbance.  Respiratory: Negative for cough.   Cardiovascular: Negative for chest pain.  Gastrointestinal: Positive for nausea (occasional). Negative for abdominal pain.  Genitourinary: Negative for dysuria.  Musculoskeletal: Negative for back pain and joint swelling.  Skin: Negative for rash.  Neurological: Negative for weakness and headaches.   Hematological: Negative for adenopathy.    PE; Blood pressure 118/84, pulse 90, temperature 97.7 F (36.5 C), temperature source Oral, resp. rate 16, height 5' 9.75" (1.772 m), weight 153 lb (69.4 kg), SpO2 100 %. Gen: Alert, well appearing.  Patient is oriented to person, place, time, and situation. TWK:MQKM: no injection, icteris, swelling, or exudate.  EOMI, PERRLA. Mouth: lips without lesion/swelling.  Oral mucosa pink and moist. Oropharynx without erythema, exudate, or swelling.  CV: RRR, no m/r/g.   LUNGS: CTA bilat, nonlabored resps, good aeration in all lung fields. ABD: soft, NT, ND, BS normal.  No hepatospenomegaly or mass.  No bruits. EXT: no clubbing, cyanosis, or edema.   Pertinent labs:  none  ASSESSMENT AND PLAN:   New pt: obtain old records.  1) GERD with esophagitis, excessive upper GI gas, hx of Barrett's esophagus. Discussed food and behavioral changes needed. Change to pantoprazole 40mg  bid instead of the 15mg  lanzop qid he is currently doing. Take tums 2-3 times a day prn.  GERD diet handout given.  Elevate head of bed 3-4 inches.  2) Hx of adenomatous colon polyps and Barrett's esophagus: he is past due for EGD and colonoscopy, so he'll neeed to get in contact with [facility] to get back in to see Dr. Henrene Pastor. If he has any trouble with this we'll help at next f/u visit in 3 weeks (CPE--I did fasting health panel labs on him today in prep for his CPE).  Spent 35 min with pt today, with >50% of this time spent in counseling and care coordination regarding the above problems.  Return in about 3 weeks (around 05/15/2015) for CPE (fasting not necessary).

## 2015-04-25 ENCOUNTER — Other Ambulatory Visit: Payer: Self-pay | Admitting: *Deleted

## 2015-04-25 MED ORDER — ATORVASTATIN CALCIUM 40 MG PO TABS: 40.0000 mg | ORAL_TABLET | Freq: Every day | ORAL | Status: DC

## 2015-05-14 ENCOUNTER — Ambulatory Visit: Payer: BLUE CROSS/BLUE SHIELD | Admitting: Family Medicine

## 2015-05-14 ENCOUNTER — Ambulatory Visit (INDEPENDENT_AMBULATORY_CARE_PROVIDER_SITE_OTHER): Payer: BLUE CROSS/BLUE SHIELD | Admitting: Family Medicine

## 2015-05-14 ENCOUNTER — Encounter: Payer: Self-pay | Admitting: Family Medicine

## 2015-05-14 VITALS — BP 119/86 | HR 90 | Temp 97.9°F | Resp 16 | Ht 69.75 in | Wt 153.0 lb

## 2015-05-14 DIAGNOSIS — Z0000 Encounter for general adult medical examination without abnormal findings: Secondary | ICD-10-CM

## 2015-05-14 DIAGNOSIS — Z125 Encounter for screening for malignant neoplasm of prostate: Secondary | ICD-10-CM

## 2015-05-14 LAB — PSA: PSA: 0.27 ng/mL (ref 0.10–4.00)

## 2015-05-14 NOTE — Progress Notes (Signed)
Pre visit review using our clinic review tool, if applicable. No additional management support is needed unless otherwise documented below in the visit note. 

## 2015-05-14 NOTE — Progress Notes (Signed)
Office Note 05/14/2015  CC:  Chief Complaint  Patient presents with  . Follow-up    3 week f/u. Pt is not fasting.     HPI:  Jacob Blanchard is a 59 y.o. White male who is here for annual health maintenance exam. No problems from starting atorva last month.  He has not called GI for repeat colonoscopy/EGD yet but says he will do so.  He is weening off of verapamil (HA prophylaxis)--will be finished with ween in 1 wk.  Past Medical History  Diagnosis Date  . GERD (gastroesophageal reflux disease)   . Seasonal and perennial allergic rhinitis     vaccine/injection therapy helpful  . Contact dermatitis and other eczema, due to unspecified cause     +scarring  . Chronic asthma   . Barrett's esophagus     Bx + 2009 and 2007  . History of adenomatous polyp of colon 08/10/06  . Migraine syndrome     verapamil prophylaxis  . Hyperlipidemia     Past Surgical History  Procedure Laterality Date  . Right knee arthroscopy  1980s    Torn meniscus (Dr. Noemi Chapel)  . Esophagogastroduodenoscopy  2007 and 07/20/08    GERD, BARRET's esoph (Dr. Henrene Pastor)  . Colonoscopy w/ polypectomy  08/10/06    Several adenomatous polyps    Family History  Problem Relation Age of Onset  . Other Father     chemical PNA siphoning gas    History   Social History  . Marital Status: Married    Spouse Name: N/A  . Number of Children: 1  . Years of Education: N/A   Occupational History  . Auto body paint product-sells   .  [employer]   Social History Main Topics  . Smoking status: Former Smoker -- 1.00 packs/day for 10 years    Types: Cigarettes    Quit date: 07/06/1993  . Smokeless tobacco: Never Used  . Alcohol Use: Yes     Comment: 1 or 2   . Drug Use: No  . Sexual Activity: Not on file   Other Topics Concern  . Not on file   Social History Narrative   Married, one daughter.   Educ: [employer]   Occup: Biochemist, clinical for [employer].    No tobacco, 2 beers a night,  no hx of problem drinking/drug use.   Caffeine: 2-3 cups coffee every morning.  Tea x 2 later in day.    Outpatient Prescriptions Prior to Visit  Medication Sig Dispense Refill  . atorvastatin (LIPITOR) 40 MG tablet Take 1 tablet (40 mg total) by mouth daily. 30 tablet 3  . Ibuprofen (ADVIL) 200 MG CAPS Take by mouth.      . lansoprazole (PREVACID) 30 MG capsule TAKE 1 CAPSULE (30 MG TOTAL) BY MOUTH 2 (TWO) TIMES DAILY. 60 capsule 6  . verapamil (CALAN) 120 MG tablet Take 1 tablet by mouth Three times a day.    . pantoprazole (PROTONIX) 40 MG tablet Take 1 tablet (40 mg total) by mouth 2 (two) times daily. (Patient not taking: Reported on 05/14/2015) 60 tablet 3   No facility-administered medications prior to visit.    No Known Allergies  ROS Review of Systems  Constitutional: Negative for fever, chills, appetite change and fatigue.  HENT: Negative for congestion, dental problem, ear pain and sore throat.   Eyes: Negative for discharge, redness and visual disturbance.  Respiratory: Negative for cough, chest tightness, shortness of breath and wheezing.   Cardiovascular: Negative  for chest pain, palpitations and leg swelling.  Gastrointestinal: Negative for nausea, vomiting, abdominal pain, diarrhea and blood in stool.  Genitourinary: Negative for dysuria, urgency, frequency, hematuria, flank pain and difficulty urinating.  Musculoskeletal: Negative for myalgias, back pain, joint swelling, arthralgias and neck stiffness.  Skin: Negative for pallor and rash.  Neurological: Negative for dizziness, speech difficulty, weakness and headaches.  Hematological: Negative for adenopathy. Does not bruise/bleed easily.  Psychiatric/Behavioral: Negative for confusion and sleep disturbance. The patient is not nervous/anxious.     PE; Blood pressure 119/86, pulse 90, temperature 97.9 F (36.6 C), temperature source Oral, resp. rate 16, height 5' 9.75" (1.772 m), weight 153 lb (69.4 kg), SpO2 99  %. Gen: Alert, well appearing.  Patient is oriented to person, place, time, and situation. AFFECT: pleasant, lucid thought and speech. ENT: Ears: EACs clear, normal epithelium.  TMs with good light reflex and landmarks bilaterally.  Eyes: no injection, icteris, swelling, or exudate.  EOMI, PERRLA. Nose: no drainage or turbinate edema/swelling.  No injection or focal lesion.  Mouth: lips without lesion/swelling.  Oral mucosa pink and moist.  Dentition intact and without obvious caries or gingival swelling.  Oropharynx without erythema, exudate, or swelling.  Neck: supple/nontender.  No LAD, mass, or TM.  Carotid pulses 2+ bilaterally, without bruits. CV: RRR, no m/r/g.   LUNGS: CTA bilat, nonlabored resps, good aeration in all lung fields. ABD: soft, NT, ND, BS normal.  No hepatospenomegaly or mass.  No bruits. EXT: no clubbing, cyanosis, or edema.  Musculoskeletal: no joint swelling, erythema, warmth, or tenderness.  ROM of all joints intact. Skin - no sores or suspicious lesions or rashes or color changes Rectal exam: negative without mass, lesions or tenderness, PROSTATE EXAM: smooth and symmetric without nodules or tenderness,  Pertinent labs:  Lab Results  Component Value Date   TSH 1.56 04/24/2015   Lab Results  Component Value Date   WBC 5.0 04/24/2015   HGB 15.9 04/24/2015   HCT 47.8 04/24/2015   MCV 95.7 04/24/2015   PLT 373.0 04/24/2015   Lab Results  Component Value Date   CREATININE 0.88 04/24/2015   BUN 11 04/24/2015   NA 138 04/24/2015   K 4.9 04/24/2015   CL 103 04/24/2015   CO2 30 04/24/2015   Lab Results  Component Value Date   ALT 21 04/24/2015   AST 19 04/24/2015   ALKPHOS 69 04/24/2015   BILITOT 0.7 04/24/2015   Lab Results  Component Value Date   CHOL 280* 04/24/2015   Lab Results  Component Value Date   HDL 46.80 04/24/2015   Lab Results  Component Value Date   LDLCALC 206* 04/24/2015   Lab Results  Component Value Date   TRIG 135.0  04/24/2015   Lab Results  Component Value Date   CHOLHDL 6 04/24/2015   ASSESSMENT AND PLAN:   Health maintenance exam: Reviewed age and gender appropriate health maintenance issues (prudent diet, regular exercise, health risks of tobacco and excessive alcohol, use of seatbelts, fire alarms in home, use of sunscreen).  Also reviewed age and gender appropriate health screening as well as vaccine recommendations. DRE normal, PSA drawn today. He was reminded again to call his GI MD office to schedule f/u colonoscopy and EGD (hx of colon polyps and Barrett's esoph). Tolerating statin; discussed diet and exercise importance for this problem as well. He prefers to wait and recheck FLP at next f/u in 6 mo.  An After Visit Summary was printed and given to the  patient.  FOLLOW UP:  Return in about 6 months (around 11/14/2015) for routine chronic illness f/u (fasting).

## 2015-07-19 ENCOUNTER — Ambulatory Visit (INDEPENDENT_AMBULATORY_CARE_PROVIDER_SITE_OTHER): Payer: BLUE CROSS/BLUE SHIELD | Admitting: Family Medicine

## 2015-07-19 ENCOUNTER — Encounter: Payer: Self-pay | Admitting: Family Medicine

## 2015-07-19 VITALS — BP 124/83 | HR 88 | Temp 98.0°F | Resp 16 | Ht 69.75 in | Wt 155.0 lb

## 2015-07-19 DIAGNOSIS — L309 Dermatitis, unspecified: Secondary | ICD-10-CM | POA: Diagnosis not present

## 2015-07-19 DIAGNOSIS — J320 Chronic maxillary sinusitis: Secondary | ICD-10-CM | POA: Insufficient documentation

## 2015-07-19 DIAGNOSIS — J0100 Acute maxillary sinusitis, unspecified: Secondary | ICD-10-CM

## 2015-07-19 MED ORDER — AMOXICILLIN-POT CLAVULANATE 875-125 MG PO TABS: 1.0000 | ORAL_TABLET | Freq: Two times a day (BID) | ORAL | Status: DC

## 2015-07-19 MED ORDER — HALOBETASOL PROPIONATE 0.05 % EX CREA: TOPICAL_CREAM | Freq: Two times a day (BID) | CUTANEOUS | Status: DC

## 2015-07-19 NOTE — Progress Notes (Signed)
Pre visit review using our clinic review tool, if applicable. No additional management support is needed unless otherwise documented below in the visit note. 

## 2015-07-19 NOTE — Progress Notes (Signed)
   Subjective:    Patient ID: Jacob Blanchard, male    DOB: 05/23/1956, 59 y.o.   MRN: [id number]  HPI  Sinus infection: Patient presents for acute office visit for sinus pressure and pain. Patient states he used to be on allergy shots for 30 years, but currently doesn't take any antihistamines. His symptoms started 3 days ago with congestion, mild cough, and left-sided maxillary facial pressure. He denies nausea, vomit, diarrhea, rash, sore throat or fever.   Eczema: Patient has occasional eczema flares, most concerning on his right forearm. He has been prescribed Halobestasol cream in the past and is wondering if he can get any refills on this. He hasn't been eating as much lately, but he is out of his medication. Past Medical History  Diagnosis Date  . GERD (gastroesophageal reflux disease)   . Seasonal and perennial allergic rhinitis     vaccine/injection therapy helpful  . Contact dermatitis and other eczema, due to unspecified cause     +scarring  . Chronic asthma   . Barrett's esophagus     Bx + 2009 and 2007  . History of adenomatous polyp of colon 08/10/06  . Migraine syndrome     verapamil prophylaxis  . Hyperlipidemia    No Known Allergies Social History   Social History  . Marital Status: Married    Spouse Name: N/A  . Number of Children: 1  . Years of Education: N/A   Occupational History  . Auto body paint product-sells   .  [employer]   Social History Main Topics  . Smoking status: Former Smoker -- 1.00 packs/day for 10 years    Types: Cigarettes    Quit date: 07/06/1993  . Smokeless tobacco: Never Used  . Alcohol Use: Yes     Comment: 1 or 2   . Drug Use: No  . Sexual Activity: Not on file   Other Topics Concern  . Not on file   Social History Narrative   Married, one daughter.   Educ: [employer]   Occup: Biochemist, clinical for [employer].    No tobacco, 2 beers a night, no hx of problem drinking/drug use.   Caffeine: 2-3 cups coffee  every morning.  Tea x 2 later in day.    Review of Systems Negative, with the exception of above mentioned in HPI      Objective:   Physical Exam BP 124/83 mmHg  Pulse 88  Temp(Src) 98 F (36.7 C) (Oral)  Resp 16  Ht 5' 9.75" (1.772 m)  Wt 155 lb (70.308 kg)  BMI 22.39 kg/m2  SpO2 96% Gen: Afebrile. No acute distress. Nontoxic in appearance. Caucasian male.  HENT: AT. [place]. Bilateral TM visualized and normal in appearance. MMM. Bilateral nares with erythema, excoriation and mild swelling..Throat without erythema or exudates. TTP left maxillary sinus.  Eyes:Pupils Equal Round Reactive to light, Extraocular movements intact,  Conjunctiva without redness, discharge or icterus. Neck/lymp/endocrine: Supple, mild cervical anterior lymphadenopathy CV: RRR Chest: CTAB, no wheeze or crackles Skin: No rashes, purpura or petechiae.      Assessment & Plan:  1. Acute maxillary sinusitis, recurrence not specified - restart nasal spray  Prescribed and nasal saline flushes. Augmentin prescribed. Mucinex/tylenol/advil as needed. Rest and hydration.  - amoxicillin-clavulanate (AUGMENTIN) 875-125 MG tablet; Take 1 tablet by mouth 2 (two) times daily.  Dispense: 20 tablet; Refill: 0

## 2015-07-19 NOTE — Patient Instructions (Signed)
Sinusitis, Adult Sinusitis is redness, soreness, and inflammation of the paranasal sinuses. Paranasal sinuses are air pockets within the bones of your face. They are located beneath your eyes, in the middle of your forehead, and above your eyes. In healthy paranasal sinuses, mucus is able to drain out, and air is able to circulate through them by way of your nose. However, when your paranasal sinuses are inflamed, mucus and air can become trapped. This can allow bacteria and other germs to grow and cause infection. Sinusitis can develop quickly and last only a short time (acute) or continue over a long period (chronic). Sinusitis that lasts for more than 12 weeks is considered chronic. CAUSES Causes of sinusitis include:  Allergies.  Structural abnormalities, such as displacement of the cartilage that separates your nostrils (deviated septum), which can decrease the air flow through your nose and sinuses and affect sinus drainage.  Functional abnormalities, such as when the small hairs (cilia) that line your sinuses and help remove mucus do not work properly or are not present. SIGNS AND SYMPTOMS Symptoms of acute and chronic sinusitis are the same. The primary symptoms are pain and pressure around the affected sinuses. Other symptoms include:  Upper toothache.  Earache.  Headache.  Bad breath.  Decreased sense of smell and taste.  A cough, which worsens when you are lying flat.  Fatigue.  Fever.  Thick drainage from your nose, which often is green and may contain pus (purulent).  Swelling and warmth over the affected sinuses. DIAGNOSIS Your health care provider will perform a physical exam. During your exam, your health care provider may perform any of the following to help determine if you have acute sinusitis or chronic sinusitis:  Look in your nose for signs of abnormal growths in your nostrils (nasal polyps).  Tap over the affected sinus to check for signs of  infection.  View the inside of your sinuses using an imaging device that has a light attached (endoscope). If your health care provider suspects that you have chronic sinusitis, one or more of the following tests may be recommended:  Allergy tests.  Nasal culture. A sample of mucus is taken from your nose, sent to a lab, and screened for bacteria.  Nasal cytology. A sample of mucus is taken from your nose and examined by your health care provider to determine if your sinusitis is related to an allergy. TREATMENT Most cases of acute sinusitis are related to a viral infection and will resolve on their own within 10 days. Sometimes, medicines are prescribed to help relieve symptoms of both acute and chronic sinusitis. These may include pain medicines, decongestants, nasal steroid sprays, or saline sprays. However, for sinusitis related to a bacterial infection, your health care provider will prescribe antibiotic medicines. These are medicines that will help kill the bacteria causing the infection. Rarely, sinusitis is caused by a fungal infection. In these cases, your health care provider will prescribe antifungal medicine. For some cases of chronic sinusitis, surgery is needed. Generally, these are cases in which sinusitis recurs more than 3 times per year, despite other treatments. HOME CARE INSTRUCTIONS  Drink plenty of water. Water helps thin the mucus so your sinuses can drain more easily.  Use a humidifier.  Inhale steam 3-4 times a day (for example, sit in the bathroom with the shower running).  Apply a warm, moist washcloth to your face 3-4 times a day, or as directed by your health care provider.  Use saline nasal sprays to help   moisten and clean your sinuses.  Take medicines only as directed by your health care provider.  If you were prescribed either an antibiotic or antifungal medicine, finish it all even if you start to feel better. SEEK IMMEDIATE MEDICAL CARE IF:  You have  increasing pain or severe headaches.  You have nausea, vomiting, or drowsiness.  You have swelling around your face.  You have vision problems.  You have a stiff neck.  You have difficulty breathing.   This information is not intended to replace advice given to you by your health care provider. Make sure you discuss any questions you have with your health care provider.   Document Released: 09/21/2005 Document Revised: 10/12/2014 Document Reviewed: 10/06/2011 Elsevier Interactive Patient Education 2016 [place] nasal spray, and saline flushes. I have called in Augmentin x2 for 10 days. Stay hydrated, advil/tylenol. Rest.

## 2015-08-12 ENCOUNTER — Encounter: Payer: Self-pay | Admitting: Family Medicine

## 2015-08-26 ENCOUNTER — Other Ambulatory Visit: Payer: Self-pay | Admitting: *Deleted

## 2015-08-26 MED ORDER — ATORVASTATIN CALCIUM 40 MG PO TABS: 40.0000 mg | ORAL_TABLET | Freq: Every day | ORAL | Status: DC

## 2015-08-26 NOTE — Telephone Encounter (Signed)
RF request for atorvastatin LOV: 04/24/15 Next ov: 11/12/15 Last written: 04/25/15 #30 w/ 3RF

## 2015-11-05 ENCOUNTER — Other Ambulatory Visit: Payer: Self-pay | Admitting: Physician Assistant

## 2015-11-12 ENCOUNTER — Encounter: Payer: Self-pay | Admitting: Family Medicine

## 2015-11-12 ENCOUNTER — Ambulatory Visit (INDEPENDENT_AMBULATORY_CARE_PROVIDER_SITE_OTHER): Payer: BLUE CROSS/BLUE SHIELD | Admitting: Family Medicine

## 2015-11-12 VITALS — BP 136/93 | HR 95 | Temp 97.8°F | Resp 16 | Ht 69.75 in | Wt 154.8 lb

## 2015-11-12 DIAGNOSIS — K219 Gastro-esophageal reflux disease without esophagitis: Secondary | ICD-10-CM | POA: Diagnosis not present

## 2015-11-12 DIAGNOSIS — Z86010 Personal history of colonic polyps: Secondary | ICD-10-CM | POA: Diagnosis not present

## 2015-11-12 DIAGNOSIS — E785 Hyperlipidemia, unspecified: Secondary | ICD-10-CM

## 2015-11-12 DIAGNOSIS — Z0000 Encounter for general adult medical examination without abnormal findings: Secondary | ICD-10-CM | POA: Diagnosis not present

## 2015-11-12 LAB — LIPID PANEL
CHOLESTEROL: 171 mg/dL (ref 0–200)
HDL: 59.1 mg/dL (ref >39.00)
LDL CALC: 99 mg/dL (ref 0–99)
NonHDL: 111.64
Total CHOL/HDL Ratio: 3
Triglycerides: 64 mg/dL (ref 0.0–149.0)
VLDL: 12.8 mg/dL (ref 0.0–40.0)

## 2015-11-12 LAB — AST: AST: 27 U/L (ref 0–37)

## 2015-11-12 LAB — ALT: ALT: 38 U/L (ref 0–53)

## 2015-11-12 MED ORDER — ZOSTER VACCINE LIVE 19400 UNT/0.65ML ~~LOC~~ SOLR: 0.6500 mL | Freq: Once | SUBCUTANEOUS | Status: DC

## 2015-11-12 NOTE — Progress Notes (Signed)
Pre visit review using our clinic review tool, if applicable. No additional management support is needed unless otherwise documented below in the visit note. 

## 2015-11-12 NOTE — Progress Notes (Signed)
OFFICE VISIT  11/12/2015   CC:  Chief Complaint  Patient presents with  . Follow-up    Pt is fasting.      HPI:    Patient is a 60 y.o. Caucasian male who presents for 6 mo f/u hyperlipidemia, GERD. Due for repeat FLP since starting atorva 40mg  7 months ago. Tolerating med fine, compliant with med. Diet: tries to limit fried foods but schedule/life hectic so fast food too tempting. Tries to walk once a week: "around the block".  GERD: stable on bid PPI. He has not contacted his GI MD to schedule f/u endoscopies for his hx of Barrett's esoph and adenomatous col polyps.   Past Medical History  Diagnosis Date  . GERD (gastroesophageal reflux disease)   . Seasonal and perennial allergic rhinitis     vaccine/injection therapy helpful  . Contact dermatitis and other eczema, due to unspecified cause     +scarring  . Chronic asthma   . Barrett's esophagus     Bx + 2009 and 2007  . History of adenomatous polyp of colon 08/10/06  . Migraine syndrome     verapamil prophylaxis  . Hyperlipidemia     Past Surgical History  Procedure Laterality Date  . Right knee arthroscopy  1980s    Torn meniscus (Dr. Noemi Chapel)  . Esophagogastroduodenoscopy  2007 and 07/20/08    GERD, BARRET's esoph (Dr. Henrene Pastor)  . Colonoscopy w/ polypectomy  08/10/06    Several adenomatous polyps  . Le venous doppler u/s  04/19/15    L leg NORMAL    Outpatient Prescriptions Prior to Visit  Medication Sig Dispense Refill  . atorvastatin (LIPITOR) 40 MG tablet Take 1 tablet (40 mg total) by mouth daily. 30 tablet 11  . Ibuprofen (ADVIL) 200 MG CAPS Take by mouth.      . lansoprazole (PREVACID) 30 MG capsule TAKE 1 CAPSULE (30 MG TOTAL) BY MOUTH 2 (TWO) TIMES DAILY. 60 capsule 6  . amoxicillin-clavulanate (AUGMENTIN) 875-125 MG tablet Take 1 tablet by mouth 2 (two) times daily. (Patient not taking: Reported on 11/12/2015) 20 tablet 0  . halobetasol (ULTRAVATE) 0.05 % cream Apply topically 2 (two) times daily.  (Patient not taking: Reported on 11/12/2015) 50 g 0   No facility-administered medications prior to visit.    No Known Allergies  ROS As per HPI  PE: Blood pressure 136/93, pulse 95, temperature 97.8 F (36.6 C), temperature source Oral, resp. rate 16, height 5' 9.75" (1.772 m), weight 154 lb 12 oz (70.194 kg), SpO2 100 %. Gen: Alert, well appearing.  Patient is oriented to person, place, time, and situation. AFFECT: pleasant, lucid thought and speech. No further exam today.  LABS:  Lab Results  Component Value Date   TSH 1.56 04/24/2015   Lab Results  Component Value Date   WBC 5.0 04/24/2015   HGB 15.9 04/24/2015   HCT 47.8 04/24/2015   MCV 95.7 04/24/2015   PLT 373.0 04/24/2015   Lab Results  Component Value Date   CREATININE 0.88 04/24/2015   BUN 11 04/24/2015   NA 138 04/24/2015   K 4.9 04/24/2015   CL 103 04/24/2015   CO2 30 04/24/2015   Lab Results  Component Value Date   ALT 21 04/24/2015   AST 19 04/24/2015   ALKPHOS 69 04/24/2015   BILITOT 0.7 04/24/2015   Lab Results  Component Value Date   CHOL 280* 04/24/2015   Lab Results  Component Value Date   HDL 46.80 04/24/2015   Lab  Results  Component Value Date   LDLCALC 206* 04/24/2015   Lab Results  Component Value Date   TRIG 135.0 04/24/2015   Lab Results  Component Value Date   CHOLHDL 6 04/24/2015   Lab Results  Component Value Date   PSA 0.27 05/14/2015    IMPRESSION AND PLAN:  1) Hyperlipidemia: tolerating statin.  Recheck FLP and AST/ALT today. Needs to eat better diet and exercise more but he did not give me the idea today that he was going to change much.  2) GERD: The current medical regimen is effective;  continue present plan and medications.  3) Hx of adenomatous colon polyps: pt encouraged once again to contact his GI MD office to arrange f/u colonoscopy (and possibly f/u EGD to f/u hx of Barrett's esoph).   Pt said he would try to do this before the end of the  year.  4) Prev health care: printed zostavax rx for pt today.  Pt declined flu vaccine today.  An After Visit Summary was printed and given to the patient.  FOLLOW UP: Return in about 6 months (around 05/11/2016) for annual CPE (fasting).

## 2015-11-22 ENCOUNTER — Encounter: Payer: Self-pay | Admitting: Internal Medicine

## 2016-01-16 DIAGNOSIS — M50323 Other cervical disc degeneration at C6-C7 level: Secondary | ICD-10-CM | POA: Diagnosis not present

## 2016-01-16 DIAGNOSIS — M9901 Segmental and somatic dysfunction of cervical region: Secondary | ICD-10-CM | POA: Diagnosis not present

## 2016-01-16 DIAGNOSIS — M50322 Other cervical disc degeneration at C5-C6 level: Secondary | ICD-10-CM | POA: Diagnosis not present

## 2016-01-16 DIAGNOSIS — M50321 Other cervical disc degeneration at C4-C5 level: Secondary | ICD-10-CM | POA: Diagnosis not present

## 2016-01-30 DIAGNOSIS — J0100 Acute maxillary sinusitis, unspecified: Secondary | ICD-10-CM | POA: Diagnosis not present

## 2016-02-20 DIAGNOSIS — M50323 Other cervical disc degeneration at C6-C7 level: Secondary | ICD-10-CM | POA: Diagnosis not present

## 2016-02-20 DIAGNOSIS — M50322 Other cervical disc degeneration at C5-C6 level: Secondary | ICD-10-CM | POA: Diagnosis not present

## 2016-02-20 DIAGNOSIS — M9901 Segmental and somatic dysfunction of cervical region: Secondary | ICD-10-CM | POA: Diagnosis not present

## 2016-02-20 DIAGNOSIS — M50321 Other cervical disc degeneration at C4-C5 level: Secondary | ICD-10-CM | POA: Diagnosis not present

## 2016-03-19 DIAGNOSIS — M50321 Other cervical disc degeneration at C4-C5 level: Secondary | ICD-10-CM | POA: Diagnosis not present

## 2016-03-19 DIAGNOSIS — M50323 Other cervical disc degeneration at C6-C7 level: Secondary | ICD-10-CM | POA: Diagnosis not present

## 2016-03-19 DIAGNOSIS — M9901 Segmental and somatic dysfunction of cervical region: Secondary | ICD-10-CM | POA: Diagnosis not present

## 2016-03-19 DIAGNOSIS — M50322 Other cervical disc degeneration at C5-C6 level: Secondary | ICD-10-CM | POA: Diagnosis not present

## 2016-04-14 DIAGNOSIS — M50323 Other cervical disc degeneration at C6-C7 level: Secondary | ICD-10-CM | POA: Diagnosis not present

## 2016-04-14 DIAGNOSIS — M50321 Other cervical disc degeneration at C4-C5 level: Secondary | ICD-10-CM | POA: Diagnosis not present

## 2016-04-14 DIAGNOSIS — M9901 Segmental and somatic dysfunction of cervical region: Secondary | ICD-10-CM | POA: Diagnosis not present

## 2016-04-14 DIAGNOSIS — M50322 Other cervical disc degeneration at C5-C6 level: Secondary | ICD-10-CM | POA: Diagnosis not present

## 2016-05-12 ENCOUNTER — Encounter: Payer: BLUE CROSS/BLUE SHIELD | Admitting: Family Medicine

## 2016-05-19 DIAGNOSIS — M9901 Segmental and somatic dysfunction of cervical region: Secondary | ICD-10-CM | POA: Diagnosis not present

## 2016-05-19 DIAGNOSIS — M50322 Other cervical disc degeneration at C5-C6 level: Secondary | ICD-10-CM | POA: Diagnosis not present

## 2016-05-19 DIAGNOSIS — M50323 Other cervical disc degeneration at C6-C7 level: Secondary | ICD-10-CM | POA: Diagnosis not present

## 2016-05-19 DIAGNOSIS — M50321 Other cervical disc degeneration at C4-C5 level: Secondary | ICD-10-CM | POA: Diagnosis not present

## 2016-05-28 DIAGNOSIS — J019 Acute sinusitis, unspecified: Secondary | ICD-10-CM | POA: Diagnosis not present

## 2016-06-04 ENCOUNTER — Ambulatory Visit (INDEPENDENT_AMBULATORY_CARE_PROVIDER_SITE_OTHER): Payer: BLUE CROSS/BLUE SHIELD | Admitting: Family Medicine

## 2016-06-04 ENCOUNTER — Encounter: Payer: Self-pay | Admitting: Family Medicine

## 2016-06-04 VITALS — BP 138/85 | HR 86 | Temp 97.8°F | Resp 16 | Ht 70.0 in | Wt 152.0 lb

## 2016-06-04 DIAGNOSIS — Z0000 Encounter for general adult medical examination without abnormal findings: Secondary | ICD-10-CM

## 2016-06-04 LAB — COMPREHENSIVE METABOLIC PANEL
ALK PHOS: 76 U/L (ref 39–117)
ALT: 26 U/L (ref 0–53)
AST: 22 U/L (ref 0–37)
Albumin: 4.5 g/dL (ref 3.5–5.2)
BUN: 8 mg/dL (ref 6–23)
CHLORIDE: 102 meq/L (ref 96–112)
CO2: 31 mEq/L (ref 19–32)
Calcium: 9.3 mg/dL (ref 8.4–10.5)
Creatinine, Ser: 0.87 mg/dL (ref 0.40–1.50)
GFR: 94.58 mL/min (ref >60.00)
GLUCOSE: 83 mg/dL (ref 70–99)
POTASSIUM: 5.3 meq/L — AB (ref 3.5–5.1)
SODIUM: 138 meq/L (ref 135–145)
TOTAL PROTEIN: 7.1 g/dL (ref 6.0–8.3)
Total Bilirubin: 0.7 mg/dL (ref 0.2–1.2)

## 2016-06-04 LAB — CBC WITH DIFFERENTIAL/PLATELET
BASOS ABS: 0 10*3/uL (ref 0.0–0.1)
Basophils Relative: 0.3 % (ref 0.0–3.0)
Eosinophils Absolute: 0.2 10*3/uL (ref 0.0–0.7)
Eosinophils Relative: 1.7 % (ref 0.0–5.0)
HCT: 43.3 % (ref 39.0–52.0)
HEMOGLOBIN: 14.6 g/dL (ref 13.0–17.0)
LYMPHS ABS: 1.4 10*3/uL (ref 0.7–4.0)
Lymphocytes Relative: 14.1 % (ref 12.0–46.0)
MCHC: 33.8 g/dL (ref 30.0–36.0)
MCV: 95.9 fl (ref 78.0–100.0)
MONO ABS: 0.9 10*3/uL (ref 0.1–1.0)
Monocytes Relative: 8.6 % (ref 3.0–12.0)
NEUTROS PCT: 75.3 % (ref 43.0–77.0)
Neutro Abs: 7.6 10*3/uL (ref 1.4–7.7)
Platelets: 355 10*3/uL (ref 150.0–400.0)
RBC: 4.52 Mil/uL (ref 4.22–5.81)
RDW: 13.5 % (ref 11.5–15.5)
WBC: 10.1 10*3/uL (ref 4.0–10.5)

## 2016-06-04 LAB — LIPID PANEL
Cholesterol: 168 mg/dL (ref 0–200)
HDL: 58.7 mg/dL (ref >39.00)
LDL Cholesterol: 91 mg/dL (ref 0–99)
NONHDL: 109.33
Total CHOL/HDL Ratio: 3
Triglycerides: 92 mg/dL (ref 0.0–149.0)
VLDL: 18.4 mg/dL (ref 0.0–40.0)

## 2016-06-04 LAB — PSA: PSA: 0.21 ng/mL (ref 0.10–4.00)

## 2016-06-04 LAB — TSH: TSH: 1.89 u[IU]/mL (ref 0.35–4.50)

## 2016-06-04 NOTE — Progress Notes (Signed)
Pre visit review using our clinic review tool, if applicable. No additional management support is needed unless otherwise documented below in the visit note. 

## 2016-06-04 NOTE — Progress Notes (Signed)
Office Note 06/04/2016  CC:  Chief Complaint  Patient presents with  . Follow-up    HPI:  Jacob Blanchard is a 60 y.o.  male who is here for annual health maintenance exam. Recent recurrent sinus troubles, went to an UC and was rx'd augmentin.  Left sided sinus pain and L neck soreness. He takes otc allergy pill and saline nasal spray.  He was on azelestine spray but felt like it wasn't helping. He started taking his verapamil again due to migraines coming back recently.  This helps.  He still has not contacted GI about getting repeat colonoscopy/EGD. He understands he is overdue for repeat colonoscopy.  Eye exam UTD. Dental preventative visits UTD.  Past Medical History:  Diagnosis Date  . Barrett's esophagus    Bx + 2009 and 2007  . Chronic asthma   . Contact dermatitis and other eczema, due to unspecified cause    +scarring  . GERD (gastroesophageal reflux disease)   . History of adenomatous polyp of colon 08/10/06  . Hyperlipidemia   . Migraine syndrome    verapamil prophylaxis  . Seasonal and perennial allergic rhinitis    vaccine/injection therapy helpful    Past Surgical History:  Procedure Laterality Date  . COLONOSCOPY W/ POLYPECTOMY  08/10/06   Several adenomatous polyps  . ESOPHAGOGASTRODUODENOSCOPY  2007 and 07/20/08   GERD, BARRET's esoph (Dr. Henrene Pastor)  . LE venous doppler u/s  04/19/15   L leg NORMAL  . right knee arthroscopy  1980s   Torn meniscus (Dr. Noemi Chapel)    Family History  Problem Relation Age of Onset  . Other Father     chemical PNA siphoning gas    Social History   Social History  . Marital status: Married    Spouse name: N/A  . Number of children: 1  . Years of education: N/A   Occupational History  . Auto body paint product-sells   .  [employer]   Social History Main Topics  . Smoking status: Former Smoker    Packs/day: 1.00    Years: 10.00    Types: Cigarettes    Quit date: 07/06/1993  . Smokeless  tobacco: Never Used  . Alcohol use Yes     Comment: 1 or 2   . Drug use: No  . Sexual activity: Not on file   Other Topics Concern  . Not on file   Social History Narrative   Married, one daughter.   Educ: [employer]   Occup: Biochemist, clinical for [employer].    No tobacco, 2 beers a night, no hx of problem drinking/drug use.   Caffeine: 2-3 cups coffee every morning.  Tea x 2 later in day.    Outpatient Medications Prior to Visit  Medication Sig Dispense Refill  . atorvastatin (LIPITOR) 40 MG tablet Take 1 tablet (40 mg total) by mouth daily. 30 tablet 11  . Ibuprofen (ADVIL) 200 MG CAPS Take by mouth.      . lansoprazole (PREVACID) 30 MG capsule TAKE 1 CAPSULE (30 MG TOTAL) BY MOUTH 2 (TWO) TIMES DAILY. 60 capsule 6  . zoster vaccine live, PF, (ZOSTAVAX) 29562 UNT/0.65ML injection Inject 19,400 Units into the skin once. (Patient not taking: Reported on 06/04/2016) 1 vial 0   No facility-administered medications prior to visit.     No Known Allergies  ROS Review of Systems  Constitutional: Negative for appetite change, chills, fatigue and fever.  HENT: Positive for ear pain (left) and sinus pressure. Negative for  congestion, dental problem and sore throat.   Eyes: Negative for discharge, redness and visual disturbance.  Respiratory: Negative for cough, chest tightness, shortness of breath and wheezing.   Cardiovascular: Negative for chest pain, palpitations and leg swelling.  Gastrointestinal: Negative for abdominal pain, blood in stool, diarrhea, nausea and vomiting.  Genitourinary: Negative for difficulty urinating, dysuria, flank pain, frequency, hematuria and urgency.  Musculoskeletal: Negative for arthralgias, back pain, joint swelling, myalgias and neck stiffness.  Skin: Negative for pallor and rash.  Neurological: Negative for dizziness, speech difficulty, weakness and headaches.  Hematological: Negative for adenopathy. Does not bruise/bleed easily.   Psychiatric/Behavioral: Negative for confusion and sleep disturbance. The patient is not nervous/anxious.     PE; Blood pressure 138/85, pulse 86, temperature 97.8 F (36.6 C), temperature source Oral, resp. rate 16, height 5\' 10"  (1.778 m), weight 152 lb (68.9 kg), SpO2 99 %. Gen: Alert, well appearing.  Patient is oriented to person, place, time, and situation. AFFECT: pleasant, lucid thought and speech. ENT: Ears: EACs clear, normal epithelium.  TMs with good light reflex and landmarks bilaterally.  Eyes: no injection, icteris, swelling, or exudate.  EOMI, PERRLA. Nose: no drainage.  Turbinate edema and diffuse mild mucosal injection is noted.  Mouth: lips without lesion/swelling.  Oral mucosa pink and moist.  Dentition intact and without obvious caries or gingival swelling.  Oropharynx without erythema, exudate, or swelling.  Neck: supple/nontender.  No LAD, mass, or TM.  Carotid pulses 2+ bilaterally, without bruits. CV: RRR, no m/r/g.   LUNGS: CTA bilat, nonlabored resps, good aeration in all lung fields. ABD: soft, NT, ND, BS normal.  No hepatospenomegaly or mass.  No bruits. EXT: no clubbing, cyanosis, or edema.  Musculoskeletal: no joint swelling, erythema, warmth, or tenderness.  ROM of all joints intact. Skin - no sores or suspicious lesions or rashes or color changes Rectal exam: negative without mass, lesions or tenderness, PROSTATE EXAM: smooth and symmetric without nodules or tenderness.   Pertinent labs:  Lab Results  Component Value Date   TSH 1.56 04/24/2015   Lab Results  Component Value Date   WBC 5.0 04/24/2015   HGB 15.9 04/24/2015   HCT 47.8 04/24/2015   MCV 95.7 04/24/2015   PLT 373.0 04/24/2015   Lab Results  Component Value Date   CREATININE 0.88 04/24/2015   BUN 11 04/24/2015   NA 138 04/24/2015   K 4.9 04/24/2015   CL 103 04/24/2015   CO2 30 04/24/2015   Lab Results  Component Value Date   ALT 38 11/12/2015   AST 27 11/12/2015   ALKPHOS 69  04/24/2015   BILITOT 0.7 04/24/2015   Lab Results  Component Value Date   CHOL 171 11/12/2015   Lab Results  Component Value Date   HDL 59.10 11/12/2015   Lab Results  Component Value Date   LDLCALC 99 11/12/2015   Lab Results  Component Value Date   TRIG 64.0 11/12/2015   Lab Results  Component Value Date   CHOLHDL 3 11/12/2015   Lab Results  Component Value Date   PSA 0.27 05/14/2015   ASSESSMENT AND PLAN:   Health maintenance exam: Reviewed age and gender appropriate health maintenance issues (prudent diet, regular exercise, health risks of tobacco and excessive alcohol, use of seatbelts, fire alarms in home, use of sunscreen).  Also reviewed age and gender appropriate health screening as well as vaccine recommendations. He declined HIV and Hep C screening today. Fasting HP labs drawn today. Prostate ca screening: DRE  normal, PSA drawn. Colon ca screening: he had polyps 08/2006 (Dr. Henrene Pastor, [facility]) and he has not followed up appropriately for repeat colonoscopy.  Instructions: Contact  GI (Dr. Blanch Media office) to schedule your colonoscopy. Rx given to pt for zostavax to take to pharmacy.  Recent rhino-sinusitis: recommended he continue nonsedating antihistamine, saline nasal spray, and I recommended he add nasal steroid.  If problems continue/requires repeated abx in short period of time, will proceed with ENT referral.  FOLLOW UP:  Return in about 1 year (around 06/04/2017) for annual CPE (fasting).  Signed:  Crissie Sickles, MD           06/04/2016

## 2016-06-04 NOTE — Patient Instructions (Signed)
Contact [facility] (Dr. Blanch Media office) to schedule your colonoscopy.

## 2016-06-18 DIAGNOSIS — M50323 Other cervical disc degeneration at C6-C7 level: Secondary | ICD-10-CM | POA: Diagnosis not present

## 2016-06-18 DIAGNOSIS — M50322 Other cervical disc degeneration at C5-C6 level: Secondary | ICD-10-CM | POA: Diagnosis not present

## 2016-06-18 DIAGNOSIS — M9901 Segmental and somatic dysfunction of cervical region: Secondary | ICD-10-CM | POA: Diagnosis not present

## 2016-06-18 DIAGNOSIS — M50321 Other cervical disc degeneration at C4-C5 level: Secondary | ICD-10-CM | POA: Diagnosis not present

## 2016-07-16 DIAGNOSIS — M50322 Other cervical disc degeneration at C5-C6 level: Secondary | ICD-10-CM | POA: Diagnosis not present

## 2016-07-16 DIAGNOSIS — M50321 Other cervical disc degeneration at C4-C5 level: Secondary | ICD-10-CM | POA: Diagnosis not present

## 2016-07-16 DIAGNOSIS — M9901 Segmental and somatic dysfunction of cervical region: Secondary | ICD-10-CM | POA: Diagnosis not present

## 2016-07-16 DIAGNOSIS — M50323 Other cervical disc degeneration at C6-C7 level: Secondary | ICD-10-CM | POA: Diagnosis not present

## 2016-08-11 ENCOUNTER — Other Ambulatory Visit: Payer: Self-pay | Admitting: Family Medicine

## 2016-08-18 DIAGNOSIS — M50323 Other cervical disc degeneration at C6-C7 level: Secondary | ICD-10-CM | POA: Diagnosis not present

## 2016-08-18 DIAGNOSIS — M9901 Segmental and somatic dysfunction of cervical region: Secondary | ICD-10-CM | POA: Diagnosis not present

## 2016-08-18 DIAGNOSIS — M50321 Other cervical disc degeneration at C4-C5 level: Secondary | ICD-10-CM | POA: Diagnosis not present

## 2016-08-18 DIAGNOSIS — M50322 Other cervical disc degeneration at C5-C6 level: Secondary | ICD-10-CM | POA: Diagnosis not present

## 2016-09-07 DIAGNOSIS — G44009 Cluster headache syndrome, unspecified, not intractable: Secondary | ICD-10-CM | POA: Diagnosis not present

## 2016-11-05 DIAGNOSIS — M50321 Other cervical disc degeneration at C4-C5 level: Secondary | ICD-10-CM | POA: Diagnosis not present

## 2016-11-05 DIAGNOSIS — M50322 Other cervical disc degeneration at C5-C6 level: Secondary | ICD-10-CM | POA: Diagnosis not present

## 2016-11-05 DIAGNOSIS — M50323 Other cervical disc degeneration at C6-C7 level: Secondary | ICD-10-CM | POA: Diagnosis not present

## 2016-11-05 DIAGNOSIS — M9901 Segmental and somatic dysfunction of cervical region: Secondary | ICD-10-CM | POA: Diagnosis not present

## 2016-12-10 DIAGNOSIS — M50323 Other cervical disc degeneration at C6-C7 level: Secondary | ICD-10-CM | POA: Diagnosis not present

## 2016-12-10 DIAGNOSIS — M50322 Other cervical disc degeneration at C5-C6 level: Secondary | ICD-10-CM | POA: Diagnosis not present

## 2016-12-10 DIAGNOSIS — M50321 Other cervical disc degeneration at C4-C5 level: Secondary | ICD-10-CM | POA: Diagnosis not present

## 2016-12-10 DIAGNOSIS — M9901 Segmental and somatic dysfunction of cervical region: Secondary | ICD-10-CM | POA: Diagnosis not present

## 2017-01-12 DIAGNOSIS — M50323 Other cervical disc degeneration at C6-C7 level: Secondary | ICD-10-CM | POA: Diagnosis not present

## 2017-01-12 DIAGNOSIS — M50322 Other cervical disc degeneration at C5-C6 level: Secondary | ICD-10-CM | POA: Diagnosis not present

## 2017-01-12 DIAGNOSIS — M9901 Segmental and somatic dysfunction of cervical region: Secondary | ICD-10-CM | POA: Diagnosis not present

## 2017-01-12 DIAGNOSIS — M50321 Other cervical disc degeneration at C4-C5 level: Secondary | ICD-10-CM | POA: Diagnosis not present

## 2017-01-18 ENCOUNTER — Ambulatory Visit (INDEPENDENT_AMBULATORY_CARE_PROVIDER_SITE_OTHER): Payer: BLUE CROSS/BLUE SHIELD | Admitting: Family Medicine

## 2017-01-18 ENCOUNTER — Encounter: Payer: Self-pay | Admitting: Family Medicine

## 2017-01-18 VITALS — BP 126/82 | HR 81 | Temp 97.8°F | Resp 20 | Wt 151.2 lb

## 2017-01-18 DIAGNOSIS — G5752 Tarsal tunnel syndrome, left lower limb: Secondary | ICD-10-CM | POA: Diagnosis not present

## 2017-01-18 DIAGNOSIS — I8392 Asymptomatic varicose veins of left lower extremity: Secondary | ICD-10-CM | POA: Diagnosis not present

## 2017-01-18 DIAGNOSIS — I878 Other specified disorders of veins: Secondary | ICD-10-CM

## 2017-01-18 NOTE — Progress Notes (Signed)
Jacob Blanchard , 14-Nov-1955, 61 y.o., male MRN: [id number] Patient Care Team    Relationship Specialty Notifications Start End  Tammi Sou, MD PCP - General Family Medicine  04/24/15   Irene Shipper, MD Consulting Physician Gastroenterology  04/24/15   Deneise Lever, MD Consulting Physician Pulmonary Disease  04/24/15     CC: left ankle pain Subjective: Pt presents for an OV with complaints of left ankle/foot pain of 2 weeks duration. He reports this occurred one other time many years ago and his life, and it self resolved. He does not feel there was any injury prior to onset of symptoms approximately 2 weeks ago. He states he first noticed discomfort in his left medial arch of his foot. He endorses pain and tingling or "nerve strike." He thought maybe this was secondary to issues, so he purchased new shoes, which have not resolved the issue. He endorses swelling in his ankle and foot, that is worse towards the end of the day. He feels the discomfort is worse when standing. He does stand for long periods of time secondary to work. He states he was taking care of the yard approximately one month ago, and it could have initiated the symptoms, however he doesn't really recall the pain and discomfort until 2 weeks ago. He has been taking Advil for discomfort intermittently. Patient reports having his veins stripped many years ago secondary to varicosities. The varicosities have returned and are worsening over the last few years.  No flowsheet data found.  No Known Allergies Social History  Substance Use Topics  . Smoking status: Former Smoker    Packs/day: 1.00    Years: 10.00    Types: Cigarettes    Quit date: 07/06/1993  . Smokeless tobacco: Never Used  . Alcohol use Yes     Comment: 1 or 2    Past Medical History:  Diagnosis Date  . Barrett's esophagus    Bx + 2009 and 2007  . Chronic asthma   . Contact dermatitis and other eczema, due to unspecified cause    +scarring  .  GERD (gastroesophageal reflux disease)   . History of adenomatous polyp of colon 08/10/06  . Hyperlipidemia   . Migraine syndrome    verapamil prophylaxis  . Seasonal and perennial allergic rhinitis    vaccine/injection therapy helpful   Past Surgical History:  Procedure Laterality Date  . COLONOSCOPY W/ POLYPECTOMY  08/10/06   Several adenomatous polyps  . ESOPHAGOGASTRODUODENOSCOPY  2007 and 07/20/08   GERD, BARRET's esoph (Dr. Henrene Pastor)  . LE venous doppler u/s  04/19/15   L leg NORMAL  . right knee arthroscopy  1980s   Torn meniscus (Dr. Noemi Chapel)   Family History  Problem Relation Age of Onset  . Other Father     chemical PNA siphoning gas   Allergies as of 01/18/2017   No Known Allergies     Medication List       Accurate as of 01/18/17  5:42 PM. Always use your most recent med list.          ADVIL 200 MG Caps Generic drug:  Ibuprofen Take by mouth.   atorvastatin 40 MG tablet Commonly known as:  LIPITOR TAKE 1 TABLET (40 MG TOTAL) BY MOUTH DAILY.   cyclobenzaprine 10 MG tablet Commonly known as:  FLEXERIL Take 10 mg by mouth 2 (two) times daily as needed.   lansoprazole 30 MG capsule Commonly known as:  PREVACID TAKE 1 CAPSULE (30  MG TOTAL) BY MOUTH 2 (TWO) TIMES DAILY.   verapamil 120 MG tablet Commonly known as:  CALAN TAKE 1 TABLET BY MOUTH 4 TIMES DAILY. DISPENSE IMMEDIATE RELEASE ONLY       No results found for this or any previous visit (from the past 24 hour(s)). No results found.   ROS: Negative, with the exception of above mentioned in HPI   Objective:  BP 126/82 (BP Location: Right Arm, Patient Position: Sitting, Cuff Size: Normal)   Pulse 81   Temp 97.8 F (36.6 C)   Resp 20   Wt 151 lb 4 oz (68.6 kg)   SpO2 98%   BMI 21.70 kg/m  Body mass index is 21.7 kg/m. Gen: Afebrile. No acute distress. Nontoxic in appearance, well developed, well nourished.  HENT: AT. [place].  MMM Eyes:Pupils Equal Round Reactive to light, Extraocular  movements intact,  Conjunctiva without redness, discharge or icterus. Skin/MSK: Hyperpigmentation/staining left lower extremity and ankle medially. Large vein varicosities distal medial thigh extending to foot. Multiple Spider veins left ankle and longitudinal arch of foot. Mild tenderness to palpation posterior to medial malleolus. Mild decrease in sensation/numbness/tingling sensation left medial longitudinal arch foot. No purpura or petechiae. Mild discoloration and swelling left lower extremity in comparison to right lower extremity, that improves with elevation. +2/4 posterior tibialis pulses bilaterally. Neuro: Normal gait. PERLA. EOMi. Alert. Oriented x3   Assessment/Plan: Jacob Blanchard is a 61 y.o. 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 Possible causes could be secondary to recent yard work or increase in swelling secondary to venous stasis causing compression around nerve. Patient has rather significant varicosities and hemosiderin staining in ankle/foot. Swelling, discoloration and vascular congestion resolve with elevation of extremity.  - Patient is to wear compression stockings prescribed today, keep feet elevated when able, can use NSAIDs scheduled for the next few days only,  as long as able to tolerate.  - Discussed sports med referral with patient today, given that his symptoms are worse with standing, would like him evaluated for possible tarsal tunnel and consider orthotics to help relieve pressure if felt indicated. Patient agreeable to referral today. - He has significant varicose veins of his left extremity. Discussed with him today if above regimen does not improve his symptoms and swelling, he may need to consider vascular referral to readdress his varicosities. Patient is in understanding. - Compression stocking - Ambulatory referral  to Sports Medicine   Reviewed expectations re: course of current medical issues.  Discussed self-management of symptoms.  Outlined signs and symptoms indicating need for more acute intervention.  Patient verbalized understanding and all questions were answered.  Patient received an After-Visit Summary.   electronically signed by:  Howard Pouch, DO  [place]

## 2017-01-18 NOTE — Patient Instructions (Signed)
You can use aleve every 12 hours with food for a few days to help with ankle discomfort.  Wear compression stockings daily. Keep feet elevated when able.  Will refer you to Dr. Darene Lamer in [place] for tarsal tunnel and possibly orthotics.   Long term --> you may need to have the varicosities addressed.     Tarsal Tunnel Syndrome Tarsal tunnel syndrome is a condition that happens when a nerve (tibial nerve) is irritated or squeezed (compressed) as it passes through an area on the inside of your ankle (tarsal tunnel). The tarsal tunnel is a narrow passage through the connective tissue and bones in your feet (tarsals). The tibial nerve passes behind the large bony bump at your inner ankle (medial malleolus) and sends branches to your foot and toes. This nerve helps supply feeling to your heel, to the bottom of your foot, and to some of your toe muscles. Tarsal tunnel syndrome usually causes ankle and foot pain that gets worse with activity. What are the causes? Tarsal tunnel syndrome can be caused by any condition that narrows the space in the tarsal tunnel. Athletes may get tarsal tunnel syndrome from a fractured ankle or from an outward (eversion) ankle sprain that results in scarring or swelling. Other common causes include:  Over-pronation. This is when your feet roll in and flatten too much when you stand, walk, or run.  Extra pressure on the tarsal tunnel area from tight or stiff shoes or boots.  Decreased room in the tarsal tunnel due to small, fluid-filled sacs (cysts) or growths on the bones near the tunnel (exostosis). What increases the risk? This condition is more likely to develop in people who:  Play sports where they wear high, stiff boots, such as downhill skiing.  Play sports with repetitive motion, such as running.  Play sports on uneven surfaces that can lead to a sprained ankle, such as soccer or football.  Have had an inner ankle injury.  Have flat feet.  Have a  condition such as diabetes, hypothyroidism, or rheumatoid arthritis. What are the signs or symptoms? Symptoms of this condition include:  Burning pain behind the ankle, in the heel, or in the foot that gets worse if you are standing, walking, or running.  Numbness or a tingling sensation (pins and needles) in your heel, foot, or toes. At first, your symptoms may get worse with activity and be relieved with rest. Over time, your symptoms may become constant or come on sooner with less activity. How is this diagnosed? This condition is diagnosed based on your symptoms, medical history, and physical exam. During the exam, your health care provider may tap on the area below your ankle to check for tingling in your foot or toes. Your health care provider may also inject numbing medicine into the tarsal tunnel to see if it relieves your pain. You may also have other tests, including:  X-rays to check bone structure.  MRI or ultrasound to examine nerve and tendon structures and find where your nerve is getting compressed.  An electrical study of nerve function (electromyography, or EMG). How is this treated? Treatment may include:  Wearing a removable splint or boot for ankle support.  Using a shoe insert (orthotic) to help support your arch.  Using ice to reduce swelling.  Taking pain medicine.  Having medicine injected into your ankle joint to reduce pain and swelling.  Starting range-of-motion exercises and strengthening exercises.  Gradually returning to full activity. The timing will depend on the  severity of your condition and your response to treatment. You may need surgery if there is a soft tissue growth or a bone growth, or if other treatments have not helped. After surgery, you may need to wear a removable splint or boot for support. You also may need physical therapy. Follow these instructions at home: If you have a splint or boot:   Wear the splint or boot as told by your  health care provider. Remove it only as told by your health care provider.  Loosen the splint or boot if your toes tingle, become numb, or turn cold and blue.  If your splint or boot is not waterproof:  Do not let it get wet.  Cover it with a watertight covering when you take a bath or shower.  Keep the splint or boot clean. Managing pain, stiffness, and swelling   If directed, put ice on the injured area.  Put ice in a plastic bag.  Place a towel between your skin and the bag.  Leave the ice on for 20 minutes, 2-3 times a day.  Move your toes often to avoid stiffness and to lessen swelling.  Raise (elevate) your foot above the level of your heart while you are sitting or lying down. Driving   Ask your health care provider when it is safe to drive if you have a splint or boot on a foot that you use for driving.  Do not drive or operate heavy machinery while taking prescription pain medicine. Activity   Return to your normal activities as told by your health care provider. Ask your health care provider what activities are safe for you.  Do exercises as told by your health care provider. General instructions   Do not use any tobacco products, such as cigarettes, chewing tobacco, and e-cigarettes. Tobacco can delay healing. If you need help quitting, ask your health care provider.  Take over-the-counter and prescription medicines only as told by your health care provider.  Keep all follow-up visits as told by your health care provider. This is important. How is this prevented?  Give your body time to rest between periods of activity.  Make sure to wear supportive and comfortable shoes during athletic activity.  Do not over-tighten ski boots or the laces on high-top shoes.  Be safe and responsible while being active to avoid falls. Contact a health care provider if:  Your ankle pain is not getting better.  You are unable to support (bear) body weight on your foot  without pain. This information is not intended to replace advice given to you by your health care provider. Make sure you discuss any questions you have with your health care provider. Document Released: 09/21/2005 Document Revised: 05/27/2016 Document Reviewed: 09/03/2015 Elsevier Interactive Patient Education  2017 [employer]   Tarsal Tunnel Syndrome Rehab Ask your health care provider which exercises are safe for you. Do exercises exactly as told by your health care provider and adjust them as directed. It is normal to feel mild stretching, pulling, tightness, or discomfort as you do these exercises, but you should stop right away if you feel sudden pain or your pain gets worse. Do not begin these exercises until told by your health care provider. Stretching and range of motion exercises These exercises warm up your muscles and joints and improve the movement and flexibility of your foot. These exercises also help to relieve pain, numbness, and tingling. Exercise A: Gastrocnemius, standing   1. Stand with your hands against  a wall. 2. Extend your left / right leg behind you, and bend your front knee slightly. 3. Keep your left / right heel on the floor and keep your back knee straight as you shift your weight toward the wall. Do this without arching your back. You should feel a gentle stretch in your back calf. 4. Hold this position for __________ seconds. 5. Return to the starting position. Repeat __________ times. Complete this exercise __________ times per day. Exercise B: Tibial nerve glide  1. Sit on a stable chair with both feet on the floor. 2. Clasp your hands together behind your back. Gently round your back and tuck your chin toward your chest. 3. Position your left / right foot so your toes are tipped up toward your shin and aimed out to the side. 4. Slowly straighten your knee as far as you can without increasing your symptoms. 5. Hold this position for __________  seconds. 6. Slowly bend your left / right knee to return to the starting position. Repeat __________ times. Complete this exercise __________ times per day. Strengthening exercises These exercises build strength and endurance in your foot. Endurance is the ability to use your muscles for a long time, even after they get tired. Exercise C: Plantar flexors   1. Sit on the floor with your left / right leg straight out in front of you. 2. Loop a rubber exercise band around the ball of your left / right foot. The ball of your foot is on the walking surface, right under your toes. Hold the ends of the band in your hands. 3. Slowly point your left / right toes downward, pushing them away from you. 4. Hold this position for __________ seconds. 5. Slowly return to the starting position. Repeat __________ times. Complete this exercise __________ times per day. Exercise D: Ankle inversion  1. Sit on the floor with your legs straight out in front of you. 2. Loop a rubber exercise band around the ball of your left / right foot. The ball of your foot is on the walking surface, right under your toes. Hold the ends of the band in your hands or secure the band to a stable object. 3. Slowly push your foot inward, toward your other leg. 4. Hold this position for __________ seconds. 5. Slowly return to the starting position. Repeat __________ times. Complete this exercise __________ times per day. Exercise E: Arch lifts (  foot intrinsics) 1. Sit in a chair with your feet flat on the floor. 2. Keeping your big toe and your heel on the floor, lift only your arch, which is on the inner edge of your left / right foot. Do not move your knee or scrunch your toes. This is a small movement. 3. Hold this position for __________ seconds. 4. Slowly return to the starting position. Repeat __________ times. Complete this exercise __________ times per day. This information is not intended to replace advice given to you by  your health care provider. Make sure you discuss any questions you have with your health care provider. Document Released: 09/21/2005 Document Revised: 05/27/2016 Document Reviewed: 09/03/2015 Elsevier Interactive Patient Education  2017 [employer].

## 2017-01-19 NOTE — Progress Notes (Signed)
Noted  

## 2017-01-21 ENCOUNTER — Ambulatory Visit (INDEPENDENT_AMBULATORY_CARE_PROVIDER_SITE_OTHER): Payer: BLUE CROSS/BLUE SHIELD

## 2017-01-21 ENCOUNTER — Encounter: Payer: Self-pay | Admitting: Sports Medicine

## 2017-01-21 ENCOUNTER — Ambulatory Visit (INDEPENDENT_AMBULATORY_CARE_PROVIDER_SITE_OTHER): Payer: BLUE CROSS/BLUE SHIELD | Admitting: Sports Medicine

## 2017-01-21 DIAGNOSIS — M79672 Pain in left foot: Secondary | ICD-10-CM

## 2017-01-21 DIAGNOSIS — M19072 Primary osteoarthritis, left ankle and foot: Secondary | ICD-10-CM

## 2017-01-21 MED ORDER — CELECOXIB 200 MG PO CAPS: ORAL_CAPSULE | ORAL | 2 refills | Status: DC

## 2017-01-21 NOTE — Progress Notes (Signed)
   Subjective:    I'm seeing this patient as a consultation for:   Dr. Kerry Hough  CC: Left foot pain  HPI: This is a pleasant 61 year old male, for the past several months he's had pain that he localizes on the plantar aspect of his left foot, moderate, persistent, occasional paresthesias to the bottom of his foot. Minimal back pain, and minimal paresthesias to the toes. Symptoms are worse with prolonged periods of weightbearing and occasionally he has pain running around the back of his medial malleolus. He has not taken any medications for this, and has not yet had imaging.  Past medical history:  Negative.  See flowsheet/record as well for more information.  Surgical history: Negative.  See flowsheet/record as well for more information.  Family history: Negative.  See flowsheet/record as well for more information.  Social history: Negative.  See flowsheet/record as well for more information.  Allergies, and medications have been entered into the medical record, reviewed, and no changes needed.   Review of Systems: No headache, visual changes, nausea, vomiting, diarrhea, constipation, dizziness, abdominal pain, skin rash, fevers, chills, night sweats, weight loss, swollen lymph nodes, body aches, joint swelling, muscle aches, chest pain, shortness of breath, mood changes, visual or auditory hallucinations.   Objective:   General: Well Developed, well nourished, and in no acute distress.  Neuro/Psych: Alert and oriented x3, extra-ocular muscles intact, able to move all 4 extremities, sensation grossly intact. Skin: Warm and dry, no rashes noted.  Respiratory: Not using accessory muscles, speaking in full sentences, trachea midline.  Cardiovascular: Pulses palpable, no extremity edema. Abdomen: Does not appear distended. Left Foot: No visible erythema or swelling. Range of motion is full in all directions. Strength is 5/5 in all directions. No hallux valgus. No pes cavus or pes  planus. No abnormal callus noted. No pain over the navicular prominence, or base of fifth metatarsal. No tenderness to palpation of the calcaneal insertion of plantar fascia. No pain at the Achilles insertion. No pain over the calcaneal bursa. No pain of the retrocalcaneal bursa. No tenderness to palpation over the tarsals, metatarsals, or phalanges. No hallux rigidus or limitus. Only minimal pain at the plantar aspect of the tarsometatarsal joint No pain with compression of the metatarsal heads. Neurovascularly intact distally.  Impression and Recommendations:   This case required medical decision making of moderate complexity.  Left foot pain Multiple processes in the differential, he likely has more of an arch strain. Does get occasional paresthesias on the plantar aspect of the foot to the toes suggestive of either a tarsal tunnel syndrome versus a lumbar radiculitis. X-rays, Celebrex, return for custom molded orthotics.

## 2017-01-21 NOTE — Assessment & Plan Note (Signed)
Multiple processes in the differential, he likely has more of an arch strain. Does get occasional paresthesias on the plantar aspect of the foot to the toes suggestive of either a tarsal tunnel syndrome versus a lumbar radiculitis. X-rays, Celebrex, return for custom molded orthotics.

## 2017-01-26 ENCOUNTER — Encounter: Payer: Self-pay | Admitting: Family Medicine

## 2017-02-02 ENCOUNTER — Encounter: Payer: Self-pay | Admitting: Sports Medicine

## 2017-02-02 ENCOUNTER — Ambulatory Visit (INDEPENDENT_AMBULATORY_CARE_PROVIDER_SITE_OTHER): Payer: BLUE CROSS/BLUE SHIELD | Admitting: Sports Medicine

## 2017-02-02 DIAGNOSIS — M79672 Pain in left foot: Secondary | ICD-10-CM | POA: Diagnosis not present

## 2017-02-02 NOTE — Progress Notes (Signed)

## 2017-02-02 NOTE — Assessment & Plan Note (Signed)
Multiple processes, arch strain versus tarsal tunnel syndrome versus simple primary osteoarthritis foot. Custom orthotics as above, we can keep lumbar radiculitis in the back of our heads. Continue Celebrex, return in one month.

## 2017-02-08 ENCOUNTER — Encounter: Payer: BLUE CROSS/BLUE SHIELD | Admitting: Sports Medicine

## 2017-02-09 DIAGNOSIS — M50322 Other cervical disc degeneration at C5-C6 level: Secondary | ICD-10-CM | POA: Diagnosis not present

## 2017-02-09 DIAGNOSIS — M50323 Other cervical disc degeneration at C6-C7 level: Secondary | ICD-10-CM | POA: Diagnosis not present

## 2017-02-09 DIAGNOSIS — M9901 Segmental and somatic dysfunction of cervical region: Secondary | ICD-10-CM | POA: Diagnosis not present

## 2017-02-09 DIAGNOSIS — M50321 Other cervical disc degeneration at C4-C5 level: Secondary | ICD-10-CM | POA: Diagnosis not present

## 2017-03-08 ENCOUNTER — Encounter: Payer: Self-pay | Admitting: Sports Medicine

## 2017-03-08 ENCOUNTER — Ambulatory Visit (INDEPENDENT_AMBULATORY_CARE_PROVIDER_SITE_OTHER): Payer: BLUE CROSS/BLUE SHIELD | Admitting: Sports Medicine

## 2017-03-08 DIAGNOSIS — M79672 Pain in left foot: Secondary | ICD-10-CM

## 2017-03-08 MED ORDER — DICLOFENAC SODIUM 75 MG PO TBEC: 75.0000 mg | DELAYED_RELEASE_TABLET | Freq: Two times a day (BID) | ORAL | 3 refills | Status: DC

## 2017-03-08 NOTE — Progress Notes (Signed)
  Subjective:    CC: Follow-up  HPI: Left foot pain: Responded extremely well to custom orthotics with 75% improvement after only a few days. Still has a bit of pain that he localizes on the lateral aspect of the foot. Pain is mild, persistent, desires interventional treatment today.  Past medical history:  Negative.  See flowsheet/record as well for more information.  Surgical history: Negative.  See flowsheet/record as well for more information.  Family history: Negative.  See flowsheet/record as well for more information.  Social history: Negative.  See flowsheet/record as well for more information.  Allergies, and medications have been entered into the medical record, reviewed, and no changes needed.   Review of Systems: No fevers, chills, night sweats, weight loss, chest pain, or shortness of breath.   Objective:    General: Well Developed, well nourished, and in no acute distress.  Neuro: Alert and oriented x3, extra-ocular muscles intact, sensation grossly intact.  HEENT: Normocephalic, atraumatic, pupils equal round reactive to light, neck supple, no masses, no lymphadenopathy, thyroid nonpalpable.  Skin: Warm and dry, no rashes. Cardiac: Regular rate and rhythm, no murmurs rubs or gallops, no lower extremity edema.  Respiratory: Clear to auscultation bilaterally. Not using accessory muscles, speaking in full sentences. Left Foot: No visible erythema or swelling. Range of motion is full in all directions. Strength is 5/5 in all directions. No hallux valgus. No pes cavus or pes planus. No abnormal callus noted. No pain over the navicular prominence, or base of fifth metatarsal. No tenderness to palpation of the calcaneal insertion of plantar fascia. No pain at the Achilles insertion. No pain over the calcaneal bursa. No pain of the retrocalcaneal bursa. Tender to palpation at the articulation with the calcaneus and cuboid  No hallux rigidus or limitus. No tenderness  palpation over interphalangeal joints. No pain with compression of the metatarsal heads. Neurovascularly intact distally.  Procedure: Real-time Ultrasound Guided Injection of left calcaneocuboid joint Device: GE Logiq E  Verbal informed consent obtained.  Time-out conducted.  Noted no overlying erythema, induration, or other signs of local infection.  Skin prepped in a sterile fashion.  Local anesthesia: Topical Ethyl chloride.  With sterile technique and under real time ultrasound guidance:  1/2 mL kenalog 40, 1/2 mL lidocaine injected easily. Completed without difficulty  Pain immediately resolved suggesting accurate placement of the medication.  Advised to call if fevers/chills, erythema, induration, drainage, or persistent bleeding.  Images permanently stored and available for review in the ultrasound unit.  Impression: Technically successful ultrasound guided injection.  Impression and Recommendations:    Left foot pain Widespread degenerative changes on x-ray, pain today has improved 75% with custom orthotics. He tells me that his NSAID is making him drowsy, switching to diclofenac. Still has some pain that he localizes at the calcaneocuboid joint This was injected under ultrasound guidance, return to see me in one month.

## 2017-03-08 NOTE — Assessment & Plan Note (Addendum)
Widespread degenerative changes on x-ray, pain today has improved 75% with custom orthotics. He tells me that his NSAID is making him drowsy, switching to diclofenac. Still has some pain that he localizes at the calcaneocuboid joint This was injected under ultrasound guidance, return to see me in one month.

## 2017-03-09 DIAGNOSIS — M50321 Other cervical disc degeneration at C4-C5 level: Secondary | ICD-10-CM | POA: Diagnosis not present

## 2017-03-09 DIAGNOSIS — M50322 Other cervical disc degeneration at C5-C6 level: Secondary | ICD-10-CM | POA: Diagnosis not present

## 2017-03-09 DIAGNOSIS — M9901 Segmental and somatic dysfunction of cervical region: Secondary | ICD-10-CM | POA: Diagnosis not present

## 2017-03-09 DIAGNOSIS — M50323 Other cervical disc degeneration at C6-C7 level: Secondary | ICD-10-CM | POA: Diagnosis not present

## 2017-03-18 ENCOUNTER — Encounter: Payer: Self-pay | Admitting: Family Medicine

## 2017-04-20 DIAGNOSIS — M50321 Other cervical disc degeneration at C4-C5 level: Secondary | ICD-10-CM | POA: Diagnosis not present

## 2017-04-20 DIAGNOSIS — M50322 Other cervical disc degeneration at C5-C6 level: Secondary | ICD-10-CM | POA: Diagnosis not present

## 2017-04-20 DIAGNOSIS — M50323 Other cervical disc degeneration at C6-C7 level: Secondary | ICD-10-CM | POA: Diagnosis not present

## 2017-04-20 DIAGNOSIS — M9901 Segmental and somatic dysfunction of cervical region: Secondary | ICD-10-CM | POA: Diagnosis not present

## 2017-05-25 DIAGNOSIS — M9901 Segmental and somatic dysfunction of cervical region: Secondary | ICD-10-CM | POA: Diagnosis not present

## 2017-05-25 DIAGNOSIS — M50323 Other cervical disc degeneration at C6-C7 level: Secondary | ICD-10-CM | POA: Diagnosis not present

## 2017-05-25 DIAGNOSIS — M50322 Other cervical disc degeneration at C5-C6 level: Secondary | ICD-10-CM | POA: Diagnosis not present

## 2017-05-25 DIAGNOSIS — M50321 Other cervical disc degeneration at C4-C5 level: Secondary | ICD-10-CM | POA: Diagnosis not present

## 2017-06-08 ENCOUNTER — Encounter: Payer: BLUE CROSS/BLUE SHIELD | Admitting: Family Medicine

## 2017-06-17 ENCOUNTER — Ambulatory Visit (INDEPENDENT_AMBULATORY_CARE_PROVIDER_SITE_OTHER): Payer: BLUE CROSS/BLUE SHIELD | Admitting: Family Medicine

## 2017-06-17 ENCOUNTER — Encounter: Payer: Self-pay | Admitting: Family Medicine

## 2017-06-17 VITALS — BP 135/82 | HR 84 | Temp 98.0°F | Resp 16 | Ht 70.0 in | Wt 146.5 lb

## 2017-06-17 DIAGNOSIS — Z125 Encounter for screening for malignant neoplasm of prostate: Secondary | ICD-10-CM | POA: Diagnosis not present

## 2017-06-17 DIAGNOSIS — Z0000 Encounter for general adult medical examination without abnormal findings: Secondary | ICD-10-CM | POA: Diagnosis not present

## 2017-06-17 DIAGNOSIS — Z1211 Encounter for screening for malignant neoplasm of colon: Secondary | ICD-10-CM

## 2017-06-17 LAB — COMPREHENSIVE METABOLIC PANEL
ALT: 22 U/L (ref 0–53)
AST: 20 U/L (ref 0–37)
Albumin: 4.5 g/dL (ref 3.5–5.2)
Alkaline Phosphatase: 68 U/L (ref 39–117)
BILIRUBIN TOTAL: 0.7 mg/dL (ref 0.2–1.2)
BUN: 10 mg/dL (ref 6–23)
CALCIUM: 9.8 mg/dL (ref 8.4–10.5)
CO2: 30 meq/L (ref 19–32)
CREATININE: 0.9 mg/dL (ref 0.40–1.50)
Chloride: 104 mEq/L (ref 96–112)
GFR: 90.65 mL/min (ref >60.00)
GLUCOSE: 91 mg/dL (ref 70–99)
Potassium: 4.7 mEq/L (ref 3.5–5.1)
Sodium: 138 mEq/L (ref 135–145)
TOTAL PROTEIN: 6.8 g/dL (ref 6.0–8.3)

## 2017-06-17 LAB — CBC WITH DIFFERENTIAL/PLATELET
BASOS ABS: 0.1 10*3/uL (ref 0.0–0.1)
Basophils Relative: 1.1 % (ref 0.0–3.0)
Eosinophils Absolute: 0.1 10*3/uL (ref 0.0–0.7)
Eosinophils Relative: 1.9 % (ref 0.0–5.0)
HCT: 45.5 % (ref 39.0–52.0)
Hemoglobin: 15 g/dL (ref 13.0–17.0)
LYMPHS ABS: 1.2 10*3/uL (ref 0.7–4.0)
Lymphocytes Relative: 24.7 % (ref 12.0–46.0)
MCHC: 32.9 g/dL (ref 30.0–36.0)
MCV: 99.6 fl (ref 78.0–100.0)
MONO ABS: 0.7 10*3/uL (ref 0.1–1.0)
MONOS PCT: 14.3 % — AB (ref 3.0–12.0)
NEUTROS ABS: 2.9 10*3/uL (ref 1.4–7.7)
NEUTROS PCT: 58 % (ref 43.0–77.0)
PLATELETS: 365 10*3/uL (ref 150.0–400.0)
RBC: 4.57 Mil/uL (ref 4.22–5.81)
RDW: 13.4 % (ref 11.5–15.5)
WBC: 4.9 10*3/uL (ref 4.0–10.5)

## 2017-06-17 LAB — LIPID PANEL
CHOL/HDL RATIO: 2
Cholesterol: 163 mg/dL (ref 0–200)
HDL: 76.2 mg/dL (ref >39.00)
LDL Cholesterol: 74 mg/dL (ref 0–99)
NONHDL: 87.06
TRIGLYCERIDES: 64 mg/dL (ref 0.0–149.0)
VLDL: 12.8 mg/dL (ref 0.0–40.0)

## 2017-06-17 LAB — PSA: PSA: 0.29 ng/mL (ref 0.10–4.00)

## 2017-06-17 LAB — TSH: TSH: 1.38 u[IU]/mL (ref 0.35–4.50)

## 2017-06-17 NOTE — Progress Notes (Signed)
Office Note 06/17/2017  CC:  Chief Complaint  Patient presents with  . Annual Exam    Pt is fasting.     HPI:  Jacob Blanchard is a 61 y.o. White male who is here for annual health maintenance exam. Feeling well.  Eye exam: 2 yrs ago. Dental preventatives UTD. Exercise: walks daily, does yard work. Diet: not working on anything.  Says he has diffuse joint aches, asks if atorva could be causing this.  Says the pain started about a year after he got on this med though.  We discussed drug holiday trial today.  Past Medical History:  Diagnosis Date  . Barrett's esophagus    Bx + 2009 and 2007  . Chronic asthma   . Contact dermatitis and other eczema, due to unspecified cause    +scarring  . GERD (gastroesophageal reflux disease)   . History of adenomatous polyp of colon 08/10/06  . Hyperlipidemia   . Left foot pain    ? Arch strain.  Some numbness to suggest possible tarsal tunnel syndrome vs lumbar radiculitis (sports med MD).  Responded very well to custom orthotic.  . Migraine syndrome    verapamil prophylaxis  . Seasonal and perennial allergic rhinitis    vaccine/injection therapy helpful    Past Surgical History:  Procedure Laterality Date  . COLONOSCOPY W/ POLYPECTOMY  08/10/06   Several adenomatous polyps  . ESOPHAGOGASTRODUODENOSCOPY  2007 and 07/20/08   GERD, BARRET's esoph (Dr. Henrene Pastor)  . LE venous doppler u/s  04/19/15   L leg NORMAL  . right knee arthroscopy  1980s   Torn meniscus (Dr. Noemi Chapel)    Family History  Problem Relation Age of Onset  . Other Father        chemical PNA siphoning gas    Social History   Social History  . Marital status: Married    Spouse name: N/A  . Number of children: 1  . Years of education: N/A   Occupational History  . [employer] body paint product-sells   .  [employer]   Social History Main Topics  . Smoking status: Former Smoker    Packs/day: 1.00    Years: 10.00    Types: Cigarettes   Quit date: 07/06/1993  . Smokeless tobacco: Never Used  . Alcohol use Yes     Comment: 1 or 2   . Drug use: No  . Sexual activity: Not on file   Other Topics Concern  . Not on file   Social History Narrative   Married, one daughter.   Educ: [employer]   Occup: Biochemist, clinical for [employer].    No tobacco, 2 beers a night, no hx of problem drinking/drug use.   Caffeine: 2-3 cups coffee every morning.  Tea x 2 later in day.    Outpatient Medications Prior to Visit  Medication Sig Dispense Refill  . atorvastatin (LIPITOR) 40 MG tablet TAKE 1 TABLET (40 MG TOTAL) BY MOUTH DAILY. 30 tablet 11  . cyclobenzaprine (FLEXERIL) 10 MG tablet Take 10 mg by mouth 2 (two) times daily as needed.  1  . Ibuprofen (ADVIL) 200 MG CAPS Take by mouth.      . lansoprazole (PREVACID) 30 MG capsule TAKE 1 CAPSULE (30 MG TOTAL) BY MOUTH 2 (TWO) TIMES DAILY. 60 capsule 6  . verapamil (CALAN) 120 MG tablet taking half tablet twice a day  0  . diclofenac (VOLTAREN) 75 MG EC tablet Take 1 tablet (75 mg total) by mouth 2 (  two) times daily. (Patient not taking: Reported on 06/17/2017) 60 tablet 3   No facility-administered medications prior to visit.     No Known Allergies  ROS Review of Systems  Constitutional: Negative for appetite change, chills, fatigue and fever.  HENT: Negative for congestion, dental problem, ear pain and sore throat.   Eyes: Negative for discharge, redness and visual disturbance.  Respiratory: Negative for cough, chest tightness, shortness of breath and wheezing.   Cardiovascular: Negative for chest pain, palpitations and leg swelling.  Gastrointestinal: Negative for abdominal pain, blood in stool, diarrhea, nausea and vomiting.  Genitourinary: Negative for difficulty urinating, dysuria, flank pain, frequency, hematuria and urgency.  Musculoskeletal: Positive for arthralgias (diffuse, mild--as per HPI). Negative for back pain, joint swelling, myalgias and neck stiffness.  Skin: Negative  for pallor and rash.  Neurological: Negative for dizziness, speech difficulty, weakness and headaches.  Hematological: Negative for adenopathy. Does not bruise/bleed easily.  Psychiatric/Behavioral: Negative for confusion and sleep disturbance. The patient is not nervous/anxious.     PE; Blood pressure 135/82, pulse 84, temperature 98 F (36.7 C), temperature source Oral, resp. rate 16, height 5\' 10"  (1.778 m), weight 146 lb 8 oz (66.5 kg), SpO2 98 %. Body mass index is 21.02 kg/m.  Gen: Alert, well appearing.  Patient is oriented to person, place, time, and situation. AFFECT: pleasant, lucid thought and speech. ENT: Ears: EACs clear, normal epithelium.  TMs with good light reflex and landmarks bilaterally.  Eyes: no injection, icteris, swelling, or exudate.  EOMI, PERRLA. Nose: no drainage or turbinate edema/swelling.  No injection or focal lesion.  Mouth: lips without lesion/swelling.  Oral mucosa pink and moist.  Dentition intact and without obvious caries or gingival swelling.  Oropharynx without erythema, exudate, or swelling.  Neck: supple/nontender.  No LAD, mass, or TM.  Carotid pulses 2+ bilaterally, without bruits. CV: RRR, no m/r/g.   LUNGS: CTA bilat, nonlabored resps, good aeration in all lung fields. ABD: soft, NT, ND, BS normal.  No hepatospenomegaly or mass.  No bruits. EXT: no clubbing, cyanosis, or edema.  Musculoskeletal: no joint swelling, erythema, warmth, or tenderness.  ROM of all joints intact. Skin - no sores or suspicious lesions or rashes or color changes Rectal exam: negative without mass, lesions or tenderness, PROSTATE EXAM: smooth and symmetric without nodules or tenderness.   Pertinent labs:  Lab Results  Component Value Date   TSH 1.89 06/04/2016   Lab Results  Component Value Date   WBC 10.1 06/04/2016   HGB 14.6 06/04/2016   HCT 43.3 06/04/2016   MCV 95.9 06/04/2016   PLT 355.0 06/04/2016   Lab Results  Component Value Date   CREATININE 0.87  06/04/2016   BUN 8 06/04/2016   NA 138 06/04/2016   K 5.3 (H) 06/04/2016   CL 102 06/04/2016   CO2 31 06/04/2016   Lab Results  Component Value Date   ALT 26 06/04/2016   AST 22 06/04/2016   ALKPHOS 76 06/04/2016   BILITOT 0.7 06/04/2016   Lab Results  Component Value Date   CHOL 168 06/04/2016   Lab Results  Component Value Date   HDL 58.70 06/04/2016   Lab Results  Component Value Date   LDLCALC 91 06/04/2016   Lab Results  Component Value Date   TRIG 92.0 06/04/2016   Lab Results  Component Value Date   CHOLHDL 3 06/04/2016   Lab Results  Component Value Date   PSA 0.21 06/04/2016   PSA 0.27 05/14/2015  ASSESSMENT AND PLAN:   Health maintenance exam: Reviewed age and gender appropriate health maintenance issues (prudent diet, regular exercise, health risks of tobacco and excessive alcohol, use of seatbelts, fire alarms in home, use of sunscreen).  Also reviewed age and gender appropriate health screening as well as vaccine recommendations. Vaccines:Tdap UTD.  Flu vaccine-pt declined this today.   Labs: Fasting HP + PSA today.  He declined HIV and Hep C screening. Prostate ca screening: DRE normal , PSA today. Colon ca screening: pt w/hx of adenomatous polyps--needed repeat colonoscopy 2012. He will call Dr. Blanch Media office to arrange this at his earliest convenience.  Diffuse myalgias--mild.  Possibly secondary to his statin.  We discussed stopping this med for 2 weeks and if joint aches resolve/improve then restart med and see if pains return.    An After Visit Summary was printed and given to the patient.  FOLLOW UP:  Return in about 1 year (around 06/17/2018) for annual CPE (fasting).  Signed:  Crissie Sickles, MD           06/17/2017

## 2017-06-17 NOTE — Patient Instructions (Signed)

## 2017-06-29 DIAGNOSIS — M9901 Segmental and somatic dysfunction of cervical region: Secondary | ICD-10-CM | POA: Diagnosis not present

## 2017-06-29 DIAGNOSIS — M50322 Other cervical disc degeneration at C5-C6 level: Secondary | ICD-10-CM | POA: Diagnosis not present

## 2017-06-29 DIAGNOSIS — M50323 Other cervical disc degeneration at C6-C7 level: Secondary | ICD-10-CM | POA: Diagnosis not present

## 2017-06-29 DIAGNOSIS — M50321 Other cervical disc degeneration at C4-C5 level: Secondary | ICD-10-CM | POA: Diagnosis not present

## 2017-08-01 ENCOUNTER — Other Ambulatory Visit: Payer: Self-pay | Admitting: Family Medicine

## 2017-08-05 DIAGNOSIS — M9901 Segmental and somatic dysfunction of cervical region: Secondary | ICD-10-CM | POA: Diagnosis not present

## 2017-08-05 DIAGNOSIS — M50322 Other cervical disc degeneration at C5-C6 level: Secondary | ICD-10-CM | POA: Diagnosis not present

## 2017-08-05 DIAGNOSIS — M50323 Other cervical disc degeneration at C6-C7 level: Secondary | ICD-10-CM | POA: Diagnosis not present

## 2017-08-05 DIAGNOSIS — M50321 Other cervical disc degeneration at C4-C5 level: Secondary | ICD-10-CM | POA: Diagnosis not present

## 2017-08-11 ENCOUNTER — Other Ambulatory Visit: Payer: Self-pay | Admitting: Family Medicine

## 2017-09-02 DIAGNOSIS — M50323 Other cervical disc degeneration at C6-C7 level: Secondary | ICD-10-CM | POA: Diagnosis not present

## 2017-09-02 DIAGNOSIS — M50322 Other cervical disc degeneration at C5-C6 level: Secondary | ICD-10-CM | POA: Diagnosis not present

## 2017-09-02 DIAGNOSIS — M9901 Segmental and somatic dysfunction of cervical region: Secondary | ICD-10-CM | POA: Diagnosis not present

## 2017-09-02 DIAGNOSIS — M50321 Other cervical disc degeneration at C4-C5 level: Secondary | ICD-10-CM | POA: Diagnosis not present

## 2017-09-06 DIAGNOSIS — G44009 Cluster headache syndrome, unspecified, not intractable: Secondary | ICD-10-CM | POA: Diagnosis not present

## 2017-10-07 DIAGNOSIS — M50322 Other cervical disc degeneration at C5-C6 level: Secondary | ICD-10-CM | POA: Diagnosis not present

## 2017-10-07 DIAGNOSIS — M9901 Segmental and somatic dysfunction of cervical region: Secondary | ICD-10-CM | POA: Diagnosis not present

## 2017-10-07 DIAGNOSIS — M50323 Other cervical disc degeneration at C6-C7 level: Secondary | ICD-10-CM | POA: Diagnosis not present

## 2017-10-07 DIAGNOSIS — M50321 Other cervical disc degeneration at C4-C5 level: Secondary | ICD-10-CM | POA: Diagnosis not present

## 2017-11-16 DIAGNOSIS — M50322 Other cervical disc degeneration at C5-C6 level: Secondary | ICD-10-CM | POA: Diagnosis not present

## 2017-11-16 DIAGNOSIS — M50323 Other cervical disc degeneration at C6-C7 level: Secondary | ICD-10-CM | POA: Diagnosis not present

## 2017-11-16 DIAGNOSIS — M50321 Other cervical disc degeneration at C4-C5 level: Secondary | ICD-10-CM | POA: Diagnosis not present

## 2017-11-16 DIAGNOSIS — M9901 Segmental and somatic dysfunction of cervical region: Secondary | ICD-10-CM | POA: Diagnosis not present

## 2017-12-28 DIAGNOSIS — M50321 Other cervical disc degeneration at C4-C5 level: Secondary | ICD-10-CM | POA: Diagnosis not present

## 2017-12-28 DIAGNOSIS — M50322 Other cervical disc degeneration at C5-C6 level: Secondary | ICD-10-CM | POA: Diagnosis not present

## 2017-12-28 DIAGNOSIS — M50323 Other cervical disc degeneration at C6-C7 level: Secondary | ICD-10-CM | POA: Diagnosis not present

## 2017-12-28 DIAGNOSIS — M9901 Segmental and somatic dysfunction of cervical region: Secondary | ICD-10-CM | POA: Diagnosis not present

## 2018-01-03 DIAGNOSIS — G44009 Cluster headache syndrome, unspecified, not intractable: Secondary | ICD-10-CM | POA: Diagnosis not present

## 2018-01-25 DIAGNOSIS — M9901 Segmental and somatic dysfunction of cervical region: Secondary | ICD-10-CM | POA: Diagnosis not present

## 2018-01-25 DIAGNOSIS — M50322 Other cervical disc degeneration at C5-C6 level: Secondary | ICD-10-CM | POA: Diagnosis not present

## 2018-01-25 DIAGNOSIS — M50323 Other cervical disc degeneration at C6-C7 level: Secondary | ICD-10-CM | POA: Diagnosis not present

## 2018-01-25 DIAGNOSIS — M50321 Other cervical disc degeneration at C4-C5 level: Secondary | ICD-10-CM | POA: Diagnosis not present

## 2018-02-24 DIAGNOSIS — M9901 Segmental and somatic dysfunction of cervical region: Secondary | ICD-10-CM | POA: Diagnosis not present

## 2018-02-24 DIAGNOSIS — M50321 Other cervical disc degeneration at C4-C5 level: Secondary | ICD-10-CM | POA: Diagnosis not present

## 2018-02-24 DIAGNOSIS — M50323 Other cervical disc degeneration at C6-C7 level: Secondary | ICD-10-CM | POA: Diagnosis not present

## 2018-02-24 DIAGNOSIS — M50322 Other cervical disc degeneration at C5-C6 level: Secondary | ICD-10-CM | POA: Diagnosis not present

## 2018-03-04 ENCOUNTER — Other Ambulatory Visit: Payer: Self-pay | Admitting: Family Medicine

## 2018-03-24 DIAGNOSIS — M9901 Segmental and somatic dysfunction of cervical region: Secondary | ICD-10-CM | POA: Diagnosis not present

## 2018-03-24 DIAGNOSIS — M50323 Other cervical disc degeneration at C6-C7 level: Secondary | ICD-10-CM | POA: Diagnosis not present

## 2018-03-24 DIAGNOSIS — M50322 Other cervical disc degeneration at C5-C6 level: Secondary | ICD-10-CM | POA: Diagnosis not present

## 2018-03-24 DIAGNOSIS — M50321 Other cervical disc degeneration at C4-C5 level: Secondary | ICD-10-CM | POA: Diagnosis not present

## 2018-04-28 DIAGNOSIS — M50322 Other cervical disc degeneration at C5-C6 level: Secondary | ICD-10-CM | POA: Diagnosis not present

## 2018-04-28 DIAGNOSIS — M9901 Segmental and somatic dysfunction of cervical region: Secondary | ICD-10-CM | POA: Diagnosis not present

## 2018-04-28 DIAGNOSIS — M50321 Other cervical disc degeneration at C4-C5 level: Secondary | ICD-10-CM | POA: Diagnosis not present

## 2018-04-28 DIAGNOSIS — M50323 Other cervical disc degeneration at C6-C7 level: Secondary | ICD-10-CM | POA: Diagnosis not present

## 2018-05-16 ENCOUNTER — Emergency Department
Admission: EM | Admit: 2018-05-16 | Discharge: 2018-05-16 | Disposition: A | Discharge status: Home / Self Care | Payer: BLUE CROSS/BLUE SHIELD | Source: Home / Self Care | Attending: Family Medicine | Admitting: Family Medicine

## 2018-05-16 ENCOUNTER — Encounter: Payer: Self-pay | Admitting: Emergency Medicine

## 2018-05-16 DIAGNOSIS — J069 Acute upper respiratory infection, unspecified: Secondary | ICD-10-CM | POA: Diagnosis not present

## 2018-05-16 MED ORDER — AMOXICILLIN-POT CLAVULANATE 875-125 MG PO TABS: 1.0000 | ORAL_TABLET | Freq: Two times a day (BID) | ORAL | 0 refills | Status: DC

## 2018-05-16 MED ORDER — PREDNISONE 20 MG PO TABS: ORAL_TABLET | ORAL | 0 refills | Status: DC

## 2018-05-16 NOTE — Discharge Instructions (Addendum)
Take plain guaifenesin (600 or 1200mg  extended release tabs such as Mucinex) twice daily, with plenty of water, for cough and congestion.  May add Pseudoephedrine (30mg , one or two every 4 to 6 hours) for sinus congestion.  Get adequate rest.   May use Afrin nasal spray (or generic oxymetazoline) each morning for about 5 days and then discontinue.  Also recommend using saline nasal spray several times daily and saline nasal irrigation (AYR is a common brand).  Use Flonase nasal spray each morning after using Afrin nasal spray and saline nasal irrigation. Try warm salt water gargles for sore throat.  Stop all antihistamines for now, and other non-prescription cough/cold preparations. May take Delsym Cough Suppressant at bedtime for nighttime cough.

## 2018-05-16 NOTE — ED Triage Notes (Signed)
Pt c/o HA and facial pain x3 days.

## 2018-05-16 NOTE — ED Provider Notes (Signed)
[facility]    CSN: [id number] Arrival date & time: 05/16/18  1518     History   Chief Complaint Chief Complaint  Patient presents with  . Facial Pain    HPI Jacob Blanchard is a 62 y.o. male.   Patient  complains of three day history of typical cold-like symptoms developing over several days, including mild sore throat, sinus congestion, headache, facial pain, fatigue, and cough.  He has a past history of frequent episodes of sinusitis, especially after colds.  The history is provided by the patient.    Past Medical History:  Diagnosis Date  . Barrett's esophagus    Bx + 2009 and 2007  . Chronic asthma   . Contact dermatitis and other eczema, due to unspecified cause    +scarring  . GERD (gastroesophageal reflux disease)   . History of adenomatous polyp of colon 08/10/06  . Hyperlipidemia   . Left foot pain    ? Arch strain.  Some numbness to suggest possible tarsal tunnel syndrome vs lumbar radiculitis (sports med MD).  Responded very well to custom orthotic.  . Migraine syndrome    verapamil prophylaxis  . Seasonal and perennial allergic rhinitis    vaccine/injection therapy helpful    Patient Active Problem List   Diagnosis Date Noted  . Left foot pain 01/21/2017  . Maxillary sinusitis 07/19/2015  . Chronic asthma 10/03/2012  . Seasonal and perennial allergic rhinitis 06/05/2008  . GERD 06/05/2008  . Eczema 06/05/2008  . Fatigue 02/21/2008    Past Surgical History:  Procedure Laterality Date  . COLONOSCOPY W/ POLYPECTOMY  08/10/06   Several adenomatous polyps  . ESOPHAGOGASTRODUODENOSCOPY  2007 and 07/20/08   GERD, BARRET's esoph (Dr. Henrene Pastor)  . LE venous doppler u/s  04/19/15   L leg NORMAL  . right knee arthroscopy  1980s   Torn meniscus (Dr. Noemi Chapel)       Home Medications    Prior to Admission medications   Medication Sig Start Date End Date Taking? Authorizing Provider  amoxicillin-clavulanate (AUGMENTIN) 875-125 MG tablet  Take 1 tablet by mouth 2 (two) times daily. Take with food 05/16/18   Kandra Nicolas, MD  atorvastatin (LIPITOR) 40 MG tablet TAKE 1 TABLET BY MOUTH EVERY DAY 03/04/18   McGowen, Adrian Blackwater, MD  cyclobenzaprine (FLEXERIL) 10 MG tablet Take 10 mg by mouth 2 (two) times daily as needed. 03/30/16   [provider]  Ibuprofen (ADVIL) 200 MG CAPS Take by mouth.      [provider]  lansoprazole (PREVACID) 30 MG capsule TAKE 1 CAPSULE (30 MG TOTAL) BY MOUTH 2 (TWO) TIMES DAILY. 05/29/13   Irene Shipper, MD  predniSONE (DELTASONE) 20 MG tablet Take one tab by mouth twice daily for 4 days, then one daily. Take with food. 05/16/18   Kandra Nicolas, MD  verapamil (CALAN) 120 MG tablet taking half tablet twice a day 05/13/16   [provider]    Family History Family History  Problem Relation Age of Onset  . Other Father        chemical PNA siphoning gas    Social History Social History   Tobacco Use  . Smoking status: Former Smoker    Packs/day: 1.00    Years: 10.00    Pack years: 10.00    Types: Cigarettes    Last attempt to quit: 07/06/1993    Years since quitting: 24.8  . Smokeless tobacco: Never Used  Substance Use Topics  .  Alcohol use: Yes    Comment: 1 or 2   . Drug use: No     Allergies   Patient has no known allergies.   Review of Systems Review of Systems + sore throat + cough No pleuritic pain No wheezing + nasal congestion + post-nasal drainage + sinus pain/pressure No itchy/red eyes No earache No hemoptysis No SOB No fever, + chills No nausea No vomiting No abdominal pain No diarrhea No urinary symptoms No skin rash + fatigue No myalgias + headache Used OTC meds without relief   Physical Exam Triage Vital Signs ED Triage Vitals  Enc Vitals Group     BP 05/16/18 1545 128/82     Pulse Rate 05/16/18 1545 78     Resp --      Temp 05/16/18 1545 98 F (36.7 C)     Temp Source 05/16/18 1545 Oral     SpO2 05/16/18 1545 99 %      Weight 05/16/18 1546 145 lb (65.8 kg)     Height --      Head Circumference --      Peak Flow --      Pain Score 05/16/18 1546 0     Pain Loc --      Pain Edu? --      Excl. in [place]? --    No data found.  Updated Vital Signs BP 128/82 (BP Location: Right Arm)   Pulse 78   Temp 98 F (36.7 C) (Oral)   Wt 65.8 kg   SpO2 99%   BMI 20.81 kg/m   Visual Acuity Right Eye Distance:   Left Eye Distance:   Bilateral Distance:    Right Eye Near:   Left Eye Near:    Bilateral Near:     Physical Exam Nursing notes and Vital Signs reviewed. Appearance:  Patient appears stated age, and in no acute distress Eyes:  Pupils are equal, round, and reactive to light and accomodation.  Extraocular movement is intact.  Conjunctivae are not inflamed  Ears:  Canals normal.  Tympanic membranes normal.  Nose:  Mildly congested turbinates.  No sinus tenderness.  Pharynx:  Normal Neck:  Supple.  Enlarged posterior/lateral nodes are palpated bilaterally, tender to palpation on the left.   Lungs:  Clear to auscultation.  Breath sounds are equal.  Moving air well. Heart:  Regular rate and rhythm without murmurs, rubs, or gallops.  Abdomen:  Nontender without masses or hepatosplenomegaly.  Bowel sounds are present.  No CVA or flank tenderness.  Extremities:  No edema.  Skin:  No rash present.    UC Treatments / Results  Labs (all labs ordered are listed, but only abnormal results are displayed) Labs Reviewed - No data to display  EKG None  Radiology No results found.  Procedures Procedures (including critical care time)  Medications Ordered in UC Medications - No data to display  Initial Impression / Assessment and Plan / UC Course  I have reviewed the triage vital signs and the nursing notes.  Pertinent labs & imaging results that were available during my care of the patient were reviewed by me and considered in my medical decision making (see chart for details).    Because of his  long history of recurring sinusitis, will begin empiric Augmentin and prednisone burst/taper. Followup with Family Doctor if not improved in 7 to 10 days.   Final Clinical Impressions(s) / UC Diagnoses   Final diagnoses:  Viral URI     Discharge  Instructions     Take plain guaifenesin (600 or 1200mg  extended release tabs such as Mucinex) twice daily, with plenty of water, for cough and congestion.  May add Pseudoephedrine (30mg , one or two every 4 to 6 hours) for sinus congestion.  Get adequate rest.   May use Afrin nasal spray (or generic oxymetazoline) each morning for about 5 days and then discontinue.  Also recommend using saline nasal spray several times daily and saline nasal irrigation (AYR is a common brand).  Use Flonase nasal spray each morning after using Afrin nasal spray and saline nasal irrigation. Try warm salt water gargles for sore throat.  Stop all antihistamines for now, and other non-prescription cough/cold preparations. May take Delsym Cough Suppressant at bedtime for nighttime cough.         ED Prescriptions    Medication Sig Dispense Auth. Provider   amoxicillin-clavulanate (AUGMENTIN) 875-125 MG tablet Take 1 tablet by mouth 2 (two) times daily. Take with food 20 tablet Kandra Nicolas, MD   predniSONE (DELTASONE) 20 MG tablet Take one tab by mouth twice daily for 4 days, then one daily. Take with food. 12 tablet Kandra Nicolas, MD         Kandra Nicolas, MD 05/16/18 [phone]

## 2018-05-24 DIAGNOSIS — M9901 Segmental and somatic dysfunction of cervical region: Secondary | ICD-10-CM | POA: Diagnosis not present

## 2018-05-24 DIAGNOSIS — M50321 Other cervical disc degeneration at C4-C5 level: Secondary | ICD-10-CM | POA: Diagnosis not present

## 2018-05-24 DIAGNOSIS — M50323 Other cervical disc degeneration at C6-C7 level: Secondary | ICD-10-CM | POA: Diagnosis not present

## 2018-05-24 DIAGNOSIS — M50322 Other cervical disc degeneration at C5-C6 level: Secondary | ICD-10-CM | POA: Diagnosis not present

## 2018-06-03 ENCOUNTER — Emergency Department (INDEPENDENT_AMBULATORY_CARE_PROVIDER_SITE_OTHER): Payer: BLUE CROSS/BLUE SHIELD

## 2018-06-03 ENCOUNTER — Emergency Department
Admission: EM | Admit: 2018-06-03 | Discharge: 2018-06-03 | Disposition: A | Discharge status: Home / Self Care | Payer: BLUE CROSS/BLUE SHIELD | Source: Home / Self Care | Attending: Family Medicine | Admitting: Family Medicine

## 2018-06-03 ENCOUNTER — Other Ambulatory Visit: Payer: Self-pay

## 2018-06-03 DIAGNOSIS — R0989 Other specified symptoms and signs involving the circulatory and respiratory systems: Secondary | ICD-10-CM | POA: Diagnosis not present

## 2018-06-03 DIAGNOSIS — J3489 Other specified disorders of nose and nasal sinuses: Secondary | ICD-10-CM | POA: Diagnosis not present

## 2018-06-03 DIAGNOSIS — J0101 Acute recurrent maxillary sinusitis: Secondary | ICD-10-CM

## 2018-06-03 MED ORDER — DOXYCYCLINE HYCLATE 100 MG PO CAPS: 100.0000 mg | ORAL_CAPSULE | Freq: Two times a day (BID) | ORAL | 0 refills | Status: DC

## 2018-06-03 MED ORDER — PREDNISONE 20 MG PO TABS: ORAL_TABLET | ORAL | 0 refills | Status: DC

## 2018-06-03 NOTE — ED Triage Notes (Signed)
Pt was seen here in UC on 05/16/18. Was given Amoxicillin and Prednisone. Was feeling better until Monday. Still having sinus pain and congestion - taking sudafed prn.

## 2018-06-03 NOTE — Discharge Instructions (Addendum)
May add Pseudoephedrine (30mg , one or two every 4 to 6 hours) for sinus congestion.  Get adequate rest.   Recommend using saline nasal spray several times daily and saline nasal irrigation (AYR is a common brand).  Use Flonase nasal spray each morning after using Afrin nasal spray and saline nasal irrigation. Try warm salt water gargles for sore throat.  Stop all antihistamines for now, and other non-prescription cough/cold preparations.

## 2018-06-03 NOTE — ED Provider Notes (Signed)
[name]    CSN: [id number] Arrival date & time: 06/03/18  1640     History   Chief Complaint Chief Complaint  Patient presents with  . Recurrent Sinusitis    HPI Jacob Blanchard is a 62 y.o. male.   Patient was treated for a viral URI on 05/16/18.  He improved until 4 days ago when he developed increased left facial pressure and sinus congestion.  He has a history of sinusitis.  No fevers, chills, and sweats.  The history is provided by the patient.    Past Medical History:  Diagnosis Date  . Barrett's esophagus    Bx + 2009 and 2007  . Chronic asthma   . Contact dermatitis and other eczema, due to unspecified cause    +scarring  . GERD (gastroesophageal reflux disease)   . History of adenomatous polyp of colon 08/10/06  . Hyperlipidemia   . Left foot pain    ? Arch strain.  Some numbness to suggest possible tarsal tunnel syndrome vs lumbar radiculitis (sports med MD).  Responded very well to custom orthotic.  . Migraine syndrome    verapamil prophylaxis  . Seasonal and perennial allergic rhinitis    vaccine/injection therapy helpful    Patient Active Problem List   Diagnosis Date Noted  . Left foot pain 01/21/2017  . Maxillary sinusitis 07/19/2015  . Chronic asthma 10/03/2012  . Seasonal and perennial allergic rhinitis 06/05/2008  . GERD 06/05/2008  . Eczema 06/05/2008  . Fatigue 02/21/2008    Past Surgical History:  Procedure Laterality Date  . COLONOSCOPY W/ POLYPECTOMY  08/10/06   Several adenomatous polyps  . ESOPHAGOGASTRODUODENOSCOPY  2007 and 07/20/08   GERD, BARRET's esoph (Dr. Henrene Pastor)  . LE venous doppler u/s  04/19/15   L leg NORMAL  . right knee arthroscopy  1980s   Torn meniscus (Dr. Noemi Chapel)       Home Medications    Prior to Admission medications   Medication Sig Start Date End Date Taking? Authorizing Provider  atorvastatin (LIPITOR) 40 MG tablet TAKE 1 TABLET BY MOUTH EVERY DAY 03/04/18   McGowen, Adrian Blackwater, MD    cyclobenzaprine (FLEXERIL) 10 MG tablet Take 10 mg by mouth 2 (two) times daily as needed. 03/30/16   [provider]  doxycycline (VIBRAMYCIN) 100 MG capsule Take 1 capsule (100 mg total) by mouth 2 (two) times daily. Take with food. 06/03/18   Kandra Nicolas, MD  Ibuprofen (ADVIL) 200 MG CAPS Take by mouth.      [provider]  lansoprazole (PREVACID) 30 MG capsule TAKE 1 CAPSULE (30 MG TOTAL) BY MOUTH 2 (TWO) TIMES DAILY. 05/29/13   Irene Shipper, MD  predniSONE (DELTASONE) 20 MG tablet Take one tab by mouth twice daily for 5 days, then one daily for 4 days. Take with food. 06/03/18   Kandra Nicolas, MD  verapamil (CALAN) 120 MG tablet taking half tablet twice a day 05/13/16   [provider]    Family History Family History  Problem Relation Age of Onset  . Other Father        chemical PNA siphoning gas    Social History Social History   Tobacco Use  . Smoking status: Former Smoker    Packs/day: 1.00    Years: 10.00    Pack years: 10.00    Types: Cigarettes    Last attempt to quit: 07/06/1993    Years since quitting: 24.9  . Smokeless tobacco: Never Used  Substance Use Topics  . Alcohol use: Yes    Comment: 1 or 2   . Drug use: No     Allergies   Patient has no known allergies.   Review of Systems Review of Systems No sore throat No cough No pleuritic pain No wheezing + nasal congestion + post-nasal drainage + sinus pain/pressure No itchy/red eyes No earache No hemoptysis No SOB No fever/chills No nausea No vomiting No abdominal pain No diarrhea No urinary symptoms No skin rash + fatigue No myalgias + headache Used OTC meds without relief   Physical Exam Triage Vital Signs ED Triage Vitals  Enc Vitals Group     BP 06/03/18 1910 (!) 152/93     Pulse --      Resp --      Temp 06/03/18 1910 98.4 F (36.9 C)     Temp Source 06/03/18 1910 Oral     SpO2 06/03/18 1910 100 %     Weight 06/03/18 1911 143 lb (64.9 kg)      Height 06/03/18 1911 5\' 8"  (1.727 m)     Head Circumference --      Peak Flow --      Pain Score 06/03/18 1911 0     Pain Loc --      Pain Edu? --      Excl. in [place]? --    No data found.  Updated Vital Signs BP (!) 152/93 (BP Location: Right Arm)   Temp 98.4 F (36.9 C) (Oral)   Ht 5\' 8"  (1.727 m)   Wt 64.9 kg   SpO2 100%   BMI 21.74 kg/m   Visual Acuity Right Eye Distance:   Left Eye Distance:   Bilateral Distance:    Right Eye Near:   Left Eye Near:    Bilateral Near:     Physical Exam Nursing notes and Vital Signs reviewed. Appearance:  Patient appears stated age, and in no acute distress Eyes:  Pupils are equal, round, and reactive to light and accomodation.  Extraocular movement is intact.  Conjunctivae are not inflamed  Ears:  Canals normal.  Tympanic membranes normal.  Nose:  Congested turbinates.  Left maxillary sinus tenderness is present.  Pharynx:  Normal Neck:  Supple.  No adenopathy.  Lungs:  Clear to auscultation.  Breath sounds are equal.  Moving air well. Heart:  Regular rate and rhythm without murmurs, rubs, or gallops.  Abdomen:  Nontender without masses or hepatosplenomegaly.  Bowel sounds are present.  No CVA or flank tenderness.  Extremities:  No edema.  Skin:  No rash present.    UC Treatments / Results  Labs (all labs ordered are listed, but only abnormal results are displayed) Labs Reviewed - No data to display  EKG None  Radiology Dg Sinuses Complete  Result Date: 06/03/2018 CLINICAL DATA:  Left-sided sinus pressure. EXAM: PARANASAL SINUSES - COMPLETE 3 + VIEW COMPARISON:  None. FINDINGS: The paranasal sinus are aerated. There is no evidence of sinus opacification air-fluid levels or mucosal thickening. No significant bone abnormalities are seen. IMPRESSION: Unremarkable maxillofacial radiographs. Maxillofacial CT is the standard for evaluation of sinuses. Electronically Signed   By: Ulyses Jarred M.D.   On: 06/03/2018 20:40     Procedures Procedures (including critical care time)  Medications Ordered in UC Medications - No data to display  Initial Impression / Assessment and Plan / UC Course  I have reviewed the triage vital signs and the nursing notes.  Pertinent labs & imaging  results that were available during my care of the patient were reviewed by me and considered in my medical decision making (see chart for details).    Begin doxycycline and prednisone burst/taper. Followup with ENT if not improving.   Final Clinical Impressions(s) / UC Diagnoses   Final diagnoses:  Recurrent maxillary sinusitis     Discharge Instructions     May add Pseudoephedrine (30mg , one or two every 4 to 6 hours) for sinus congestion.  Get adequate rest.   Recommend using saline nasal spray several times daily and saline nasal irrigation (AYR is a common brand).  Use Flonase nasal spray each morning after using Afrin nasal spray and saline nasal irrigation. Try warm salt water gargles for sore throat.  Stop all antihistamines for now, and other non-prescription cough/cold preparations.       ED Prescriptions    Medication Sig Dispense Auth. Provider   predniSONE (DELTASONE) 20 MG tablet Take one tab by mouth twice daily for 5 days, then one daily for 4 days. Take with food. 14 tablet Kandra Nicolas, MD   doxycycline (VIBRAMYCIN) 100 MG capsule Take 1 capsule (100 mg total) by mouth 2 (two) times daily. Take with food. 28 capsule Kandra Nicolas, MD        Kandra Nicolas, MD 06/06/18 2325

## 2018-06-05 DIAGNOSIS — N503 Cyst of epididymis: Secondary | ICD-10-CM

## 2018-06-05 HISTORY — DX: Cyst of epididymis: N50.3

## 2018-06-21 DIAGNOSIS — M50322 Other cervical disc degeneration at C5-C6 level: Secondary | ICD-10-CM | POA: Diagnosis not present

## 2018-06-21 DIAGNOSIS — M50321 Other cervical disc degeneration at C4-C5 level: Secondary | ICD-10-CM | POA: Diagnosis not present

## 2018-06-21 DIAGNOSIS — M9901 Segmental and somatic dysfunction of cervical region: Secondary | ICD-10-CM | POA: Diagnosis not present

## 2018-06-21 DIAGNOSIS — M50323 Other cervical disc degeneration at C6-C7 level: Secondary | ICD-10-CM | POA: Diagnosis not present

## 2018-06-23 ENCOUNTER — Ambulatory Visit (INDEPENDENT_AMBULATORY_CARE_PROVIDER_SITE_OTHER): Payer: BLUE CROSS/BLUE SHIELD | Admitting: Family Medicine

## 2018-06-23 ENCOUNTER — Encounter: Payer: Self-pay | Admitting: Family Medicine

## 2018-06-23 VITALS — BP 132/84 | HR 90 | Temp 97.8°F | Resp 16 | Ht 70.0 in | Wt 141.5 lb

## 2018-06-23 DIAGNOSIS — N5089 Other specified disorders of the male genital organs: Secondary | ICD-10-CM

## 2018-06-23 DIAGNOSIS — E7800 Pure hypercholesterolemia, unspecified: Secondary | ICD-10-CM

## 2018-06-23 DIAGNOSIS — Z125 Encounter for screening for malignant neoplasm of prostate: Secondary | ICD-10-CM

## 2018-06-23 DIAGNOSIS — N509 Disorder of male genital organs, unspecified: Secondary | ICD-10-CM

## 2018-06-23 DIAGNOSIS — Z1211 Encounter for screening for malignant neoplasm of colon: Secondary | ICD-10-CM | POA: Diagnosis not present

## 2018-06-23 DIAGNOSIS — Z23 Encounter for immunization: Secondary | ICD-10-CM | POA: Diagnosis not present

## 2018-06-23 DIAGNOSIS — J301 Allergic rhinitis due to pollen: Secondary | ICD-10-CM | POA: Diagnosis not present

## 2018-06-23 DIAGNOSIS — Z0000 Encounter for general adult medical examination without abnormal findings: Secondary | ICD-10-CM | POA: Diagnosis not present

## 2018-06-23 LAB — CBC WITH DIFFERENTIAL/PLATELET
Basophils Absolute: 0.1 10*3/uL (ref 0.0–0.1)
Basophils Relative: 1 % (ref 0.0–3.0)
Eosinophils Absolute: 0.1 10*3/uL (ref 0.0–0.7)
Eosinophils Relative: 1.7 % (ref 0.0–5.0)
HCT: 46.1 % (ref 39.0–52.0)
Hemoglobin: 15.7 g/dL (ref 13.0–17.0)
LYMPHS ABS: 1.2 10*3/uL (ref 0.7–4.0)
Lymphocytes Relative: 20.7 % (ref 12.0–46.0)
MCHC: 34 g/dL (ref 30.0–36.0)
MCV: 98.1 fl (ref 78.0–100.0)
MONO ABS: 0.7 10*3/uL (ref 0.1–1.0)
Monocytes Relative: 12 % (ref 3.0–12.0)
Neutro Abs: 3.7 10*3/uL (ref 1.4–7.7)
Neutrophils Relative %: 64.6 % (ref 43.0–77.0)
Platelets: 441 10*3/uL — ABNORMAL HIGH (ref 150.0–400.0)
RBC: 4.71 Mil/uL (ref 4.22–5.81)
RDW: 13 % (ref 11.5–15.5)
WBC: 5.7 10*3/uL (ref 4.0–10.5)

## 2018-06-23 LAB — LIPID PANEL
Cholesterol: 143 mg/dL (ref 0–200)
HDL: 59.6 mg/dL (ref >39.00)
LDL Cholesterol: 75 mg/dL (ref 0–99)
NONHDL: 83.8
Total CHOL/HDL Ratio: 2
Triglycerides: 45 mg/dL (ref 0.0–149.0)
VLDL: 9 mg/dL (ref 0.0–40.0)

## 2018-06-23 LAB — COMPREHENSIVE METABOLIC PANEL
ALK PHOS: 68 U/L (ref 39–117)
ALT: 25 U/L (ref 0–53)
AST: 21 U/L (ref 0–37)
Albumin: 4.6 g/dL (ref 3.5–5.2)
BILIRUBIN TOTAL: 0.7 mg/dL (ref 0.2–1.2)
BUN: 10 mg/dL (ref 6–23)
CO2: 31 meq/L (ref 19–32)
Calcium: 10 mg/dL (ref 8.4–10.5)
Chloride: 103 mEq/L (ref 96–112)
Creatinine, Ser: 0.82 mg/dL (ref 0.40–1.50)
GFR: 100.6 mL/min (ref >60.00)
GLUCOSE: 94 mg/dL (ref 70–99)
Potassium: 5.3 mEq/L — ABNORMAL HIGH (ref 3.5–5.1)
SODIUM: 139 meq/L (ref 135–145)
TOTAL PROTEIN: 7.1 g/dL (ref 6.0–8.3)

## 2018-06-23 LAB — PSA: PSA: 0.17 ng/mL (ref 0.10–4.00)

## 2018-06-23 LAB — TSH: TSH: 1.94 u[IU]/mL (ref 0.35–4.50)

## 2018-06-23 MED ORDER — FEXOFENADINE HCL 180 MG PO TABS: 180.0000 mg | ORAL_TABLET | Freq: Every day | ORAL | 11 refills | Status: DC

## 2018-06-23 MED ORDER — FLUTICASONE PROPIONATE 50 MCG/ACT NA SUSP: 2.0000 | Freq: Every day | NASAL | 11 refills | Status: DC

## 2018-06-23 NOTE — Progress Notes (Signed)
Office Note 06/23/2018  CC:  Chief Complaint  Patient presents with  . Annual Exam    Pt is fasting.    HPI:  Jacob Blanchard is a 62 y.o. White male who is here for annual health maintenance exam.  Has had 2 "sinus infections" in the last couple months, abx and steroids rx'd, sounds like allergic rhinitis sx's chronic lately. Sinus plain films normal 06/03/18.  Just a little nasal congestion lately, no face pain or purulent mucous. Using afrin, sounds like too frequently.  Complains of 6 mo hx of L scrotal mass, painless.  No abd pain.  The mass does not reduce.   Past Medical History:  Diagnosis Date  . Barrett's esophagus    Bx + 2009 and 2007  . Chronic asthma   . Contact dermatitis and other eczema, due to unspecified cause    +scarring  . GERD (gastroesophageal reflux disease)   . History of adenomatous polyp of colon 08/10/06  . Hyperlipidemia   . Left foot pain    ? Arch strain.  Some numbness to suggest possible tarsal tunnel syndrome vs lumbar radiculitis (sports med MD).  Responded very well to custom orthotic.  . Migraine syndrome    verapamil prophylaxis  . Seasonal and perennial allergic rhinitis    vaccine/injection therapy helpful    Past Surgical History:  Procedure Laterality Date  . COLONOSCOPY W/ POLYPECTOMY  08/10/06   Several adenomatous polyps  . ESOPHAGOGASTRODUODENOSCOPY  2007 and 07/20/08   GERD, BARRET's esoph (Dr. Henrene Pastor)  . LE venous doppler u/s  04/19/15   L leg NORMAL  . right knee arthroscopy  1980s   Torn meniscus (Dr. Noemi Chapel)    Family History  Problem Relation Age of Onset  . Other Father        chemical PNA siphoning gas    Social History   Socioeconomic History  . Marital status: Married    Spouse name: Not on file  . Number of children: 1  . Years of education: Not on file  . Highest education level: Not on file  Occupational History  . 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 Financial resource strain: Not on file  . Food insecurity:    Worry: Not on file    Inability: Not on file  . Transportation needs:    Medical: Not on file    Non-medical: Not on file  Tobacco Use  . Smoking status: Former Smoker    Packs/day: 1.00    Years: 10.00    Pack years: 10.00    Types: Cigarettes    Last attempt to quit: 07/06/1993    Years since quitting: 24.9  . Smokeless tobacco: Never Used  Substance and Sexual Activity  . Alcohol use: Yes    Comment: 1 or 2   . Drug use: No  . Sexual activity: Not on file  Lifestyle  . Physical activity:    Days per week: Not on file    Minutes per session: Not on file  . Stress: Not on file  Relationships  . Social connections:    Talks on phone: Not on file    Gets together: Not on file    Attends religious service: Not on file    Active member of club or organization: Not on file    Attends meetings of clubs or organizations: Not on file    Relationship status: Not on file  .  Intimate partner violence:    Fear of current or ex partner: Not on file    Emotionally abused: Not on file    Physically abused: Not on file    Forced sexual activity: Not on file  Other Topics Concern  . Not on file  Social History Narrative   Married, one daughter.   Educ: [employer]   Occup: Biochemist, clinical for [employer].    No tobacco, 2 beers a night, no hx of problem drinking/drug use.   Caffeine: 2-3 cups coffee every morning.  Tea x 2 later in day.    Outpatient Medications Prior to Visit  Medication Sig Dispense Refill  . atorvastatin (LIPITOR) 40 MG tablet TAKE 1 TABLET BY MOUTH EVERY DAY 30 tablet 4  . cyclobenzaprine (FLEXERIL) 10 MG tablet Take 10 mg by mouth 2 (two) times daily as needed.  1  . Ibuprofen (ADVIL) 200 MG CAPS Take by mouth.      . lansoprazole (PREVACID) 30 MG capsule TAKE 1 CAPSULE (30 MG TOTAL) BY MOUTH 2 (TWO) TIMES DAILY. 60 capsule 6  . doxycycline (VIBRAMYCIN) 100 MG capsule Take  1 capsule (100 mg total) by mouth 2 (two) times daily. Take with food. (Patient not taking: Reported on 06/23/2018) 28 capsule 0  . predniSONE (DELTASONE) 20 MG tablet Take one tab by mouth twice daily for 5 days, then one daily for 4 days. Take with food. (Patient not taking: Reported on 06/23/2018) 14 tablet 0  . verapamil (CALAN) 120 MG tablet taking half tablet twice a day  0   No facility-administered medications prior to visit.     No Known Allergies  ROS Review of Systems  Constitutional: Negative for appetite change, chills, fatigue and fever.  HENT: Positive for congestion. Negative for dental problem, ear pain and sore throat.        Nasal irritation/burning  Eyes: Negative for discharge, redness and visual disturbance.  Respiratory: Negative for cough, chest tightness, shortness of breath and wheezing.   Cardiovascular: Negative for chest pain, palpitations and leg swelling.  Gastrointestinal: Negative for abdominal pain, blood in stool, diarrhea, nausea and vomiting.  Genitourinary: Positive for scrotal swelling. Negative for difficulty urinating, dysuria, flank pain, frequency, hematuria and urgency.  Musculoskeletal: Negative for arthralgias, back pain, joint swelling, myalgias and neck stiffness.  Skin: Negative for pallor and rash.  Neurological: Negative for dizziness, speech difficulty, weakness and headaches.  Hematological: Negative for adenopathy. Does not bruise/bleed easily.  Psychiatric/Behavioral: Negative for confusion and sleep disturbance. The patient is not nervous/anxious.     PE; Blood pressure 132/84, pulse 90, temperature 97.8 F (36.6 C), temperature source Oral, resp. rate 16, height 5\' 10"  (1.778 m), weight 141 lb 8 oz (64.2 kg), SpO2 100 %. Gen: Alert, well appearing.  Patient is oriented to person, place, time, and situation. AFFECT: pleasant, lucid thought and speech. ENT: Ears: EACs clear, normal epithelium.  TMs with good light reflex and  landmarks bilaterally.  Eyes: no injection, icteris, swelling, or exudate.  EOMI, PERRLA. Nose: no drainage or turbinate edema/swelling.  No injection or focal lesion.  Mouth: lips without lesion/swelling.  Oral mucosa pink and moist.  Dentition intact and without obvious caries or gingival swelling.  Oropharynx without erythema, exudate, or swelling.  Neck: supple/nontender.  No LAD, mass, or TM.  Carotid pulses 2+ bilaterally, without bruits. CV: RRR, no m/r/g.   LUNGS: CTA bilat, nonlabored resps, good aeration in all lung fields. ABD: soft, NT, ND, BS normal.  No hepatospenomegaly  or mass.  No bruits. EXT: no clubbing, cyanosis, or edema.  Musculoskeletal: no joint swelling, erythema, warmth, or tenderness.  ROM of all joints intact. Skin - no sores or suspicious lesions or rashes or color changes Rectal exam: negative without mass, lesions or tenderness, PROSTATE EXAM: smooth and symmetric without nodules or tenderness Genitals normal; right testicle and R scrotal contents normal, Left testicle is slightly larger than R w/out nodule/mass/tenderness.  Superior to L testicle is a mass-like fullness that is rubbery/soft and moveable and does not seem to be attached to the testicle.  I am not able to reduce this.  Shaft normal, circumcised, meatus normal without discharge. No inguinal hernia noted. No inguinal lymphadenopathy. .   Pertinent labs:  Lab Results  Component Value Date   TSH 1.38 06/17/2017   Lab Results  Component Value Date   WBC 4.9 06/17/2017   HGB 15.0 06/17/2017   HCT 45.5 06/17/2017   MCV 99.6 06/17/2017   PLT 365.0 06/17/2017   Lab Results  Component Value Date   CREATININE 0.90 06/17/2017   BUN 10 06/17/2017   NA 138 06/17/2017   K 4.7 06/17/2017   CL 104 06/17/2017   CO2 30 06/17/2017   Lab Results  Component Value Date   ALT 22 06/17/2017   AST 20 06/17/2017   ALKPHOS 68 06/17/2017   BILITOT 0.7 06/17/2017   Lab Results  Component Value Date   CHOL  163 06/17/2017   Lab Results  Component Value Date   HDL 76.20 06/17/2017   Lab Results  Component Value Date   LDLCALC 74 06/17/2017   Lab Results  Component Value Date   TRIG 64.0 06/17/2017   Lab Results  Component Value Date   CHOLHDL 2 06/17/2017   Lab Results  Component Value Date   PSA 0.29 06/17/2017   PSA 0.21 06/04/2016   PSA 0.27 05/14/2015   No results found for: HGBA1C  ASSESSMENT AND PLAN:   1) Allergic rhinitis, with a couple of episodes of acute sinusitis lately. Stop afrin.  Start daily flonase and allegra 180mg  -->both rx'd today.  2) Left scrotal mass: ordered ultrasound of scrotum to further evaluate.  3) Health maintenance exam: Reviewed age and gender appropriate health maintenance issues (prudent diet, regular exercise, health risks of tobacco and excessive alcohol, use of seatbelts, fire alarms in home, use of sunscreen).  Also reviewed age and gender appropriate health screening as well as vaccine recommendations. Vaccines: flu vaccine--> declined.  Shingrix--> #1 given here today. Labs: fasting HP labs + PSA. Prostate ca screening: DRE normal, PSA. Colon ca screening: overdue for colonoscopy, he'll get in touch with his GI MD to schedule.   An After Visit Summary was printed and given to the patient.  FOLLOW UP:  No follow-ups on file.  Signed:  Crissie Sickles, MD           06/23/2018

## 2018-06-23 NOTE — Addendum Note (Signed)
Addended by: Onalee Hua on: 06/23/2018 09:12 AM   Modules accepted: Orders

## 2018-06-23 NOTE — Patient Instructions (Addendum)
Call your gastroenterologist's office to schedule your repeat colonoscopy.    Health Maintenance, Male A healthy lifestyle and preventive care is important for your health and wellness. Ask your health care provider about what schedule of regular examinations is right for you. What should I know about weight and diet? Eat a Healthy Diet  Eat plenty of vegetables, fruits, whole grains, low-fat dairy products, and lean protein.  Do not eat a lot of foods high in solid fats, added sugars, or salt.  Maintain a Healthy Weight Regular exercise can help you achieve or maintain a healthy weight. You should:  Do at least 150 minutes of exercise each week. The exercise should increase your heart rate and make you sweat (moderate-intensity exercise).  Do strength-training exercises at least twice a week.  Watch Your Levels of Cholesterol and Blood Lipids  Have your blood tested for lipids and cholesterol every 5 years starting at 64 years of age. If you are at high risk for heart disease, you should start having your blood tested when you are 64 years old. You may need to have your cholesterol levels checked more often if: ? Your lipid or cholesterol levels are high. ? You are older than 64 years of age. ? You are at high risk for heart disease.  What should I know about cancer screening? Many types of cancers can be detected early and may often be prevented. Lung Cancer  You should be screened every year for lung cancer if: ? You are a current smoker who has smoked for at least 30 years. ? You are a former smoker who has quit within the past 15 years.  Talk to your health care provider about your screening options, when you should start screening, and how often you should be screened.  Colorectal Cancer  Routine colorectal cancer screening usually begins at 64 years of age and should be repeated every 5-10 years until you are 64 years old. You may need to be screened more often if early  forms of precancerous polyps or small growths are found. Your health care provider may recommend screening at an earlier age if you have risk factors for colon cancer.  Your health care provider may recommend using home test kits to check for hidden blood in the stool.  A small camera at the end of a tube can be used to examine your colon (sigmoidoscopy or colonoscopy). This checks for the earliest forms of colorectal cancer.  Prostate and Testicular Cancer  Depending on your age and overall health, your health care provider may do certain tests to screen for prostate and testicular cancer.  Talk to your health care provider about any symptoms or concerns you have about testicular or prostate cancer.  Skin Cancer  Check your skin from head to toe regularly.  Tell your health care provider about any new moles or changes in moles, especially if: ? There is a change in a mole's size, shape, or color. ? You have a mole that is larger than a pencil eraser.  Always use sunscreen. Apply sunscreen liberally and repeat throughout the day.  Protect yourself by wearing long sleeves, pants, a wide-brimmed hat, and sunglasses when outside.  What should I know about heart disease, diabetes, and high blood pressure?  If you are 79-64 years of age, have your blood pressure checked every 3-5 years. If you are 70 years of age or older, have your blood pressure checked every year. You should have your blood pressure measured  twice-once when you are at a hospital or clinic, and once when you are not at a hospital or clinic. Record the average of the two measurements. To check your blood pressure when you are not at a hospital or clinic, you can use: ? An automated blood pressure machine at a pharmacy. ? A home blood pressure monitor.  Talk to your health care provider about your target blood pressure.  If you are between 10-46 years old, ask your health care provider if you should take aspirin to prevent  heart disease.  Have regular diabetes screenings by checking your fasting blood sugar level. ? If you are at a normal weight and have a low risk for diabetes, have this test once every three years after the age of 50. ? If you are overweight and have a high risk for diabetes, consider being tested at a younger age or more often.  A one-time screening for abdominal aortic aneurysm (AAA) by ultrasound is recommended for men aged 36-75 years who are current or former smokers. What should I know about preventing infection? Hepatitis B If you have a higher risk for hepatitis B, you should be screened for this virus. Talk with your health care provider to find out if you are at risk for hepatitis B infection. Hepatitis C Blood testing is recommended for:  Everyone born from [birth year] through 1967.  Anyone with known risk factors for hepatitis C.  Sexually Transmitted Diseases (STDs)  You should be screened each year for STDs including gonorrhea and chlamydia if: ? You are sexually active and are younger than 64 years of age. ? You are older than 64 years of age and your health care provider tells you that you are at risk for this type of infection. ? Your sexual activity has changed since you were last screened and you are at an increased risk for chlamydia or gonorrhea. Ask your health care provider if you are at risk.  Talk with your health care provider about whether you are at high risk of being infected with HIV. Your health care provider may recommend a prescription medicine to help prevent HIV infection.  What else can I do?  Schedule regular health, dental, and eye exams.  Stay current with your vaccines (immunizations).  Do not use any tobacco products, such as cigarettes, chewing tobacco, and e-cigarettes. If you need help quitting, ask your health care provider.  Limit alcohol intake to no more than 2 drinks per day. One drink equals 12 ounces of beer, 5 ounces of wine, or 1 ounces  of hard liquor.  Do not use street drugs.  Do not share needles.  Ask your health care provider for help if you need support or information about quitting drugs.  Tell your health care provider if you often feel depressed.  Tell your health care provider if you have ever been abused or do not feel safe at home. This information is not intended to replace advice given to you by your health care provider. Make sure you discuss any questions you have with your health care provider. Document Released: 03/19/2008 Document Revised: 05/20/2016 Document Reviewed: 06/25/2015 Elsevier Interactive Patient Education  [employer].

## 2018-06-30 ENCOUNTER — Ambulatory Visit (INDEPENDENT_AMBULATORY_CARE_PROVIDER_SITE_OTHER): Payer: BLUE CROSS/BLUE SHIELD

## 2018-06-30 DIAGNOSIS — N5089 Other specified disorders of the male genital organs: Secondary | ICD-10-CM

## 2018-06-30 DIAGNOSIS — N503 Cyst of epididymis: Secondary | ICD-10-CM | POA: Diagnosis not present

## 2018-07-01 ENCOUNTER — Encounter: Payer: Self-pay | Admitting: Family Medicine

## 2018-07-03 DIAGNOSIS — R109 Unspecified abdominal pain: Secondary | ICD-10-CM | POA: Diagnosis not present

## 2018-07-03 DIAGNOSIS — R1030 Lower abdominal pain, unspecified: Secondary | ICD-10-CM | POA: Diagnosis not present

## 2018-07-03 DIAGNOSIS — K565 Intestinal adhesions [bands], unspecified as to partial versus complete obstruction: Secondary | ICD-10-CM | POA: Diagnosis not present

## 2018-07-03 DIAGNOSIS — R188 Other ascites: Secondary | ICD-10-CM | POA: Diagnosis not present

## 2018-07-03 DIAGNOSIS — Z79899 Other long term (current) drug therapy: Secondary | ICD-10-CM | POA: Diagnosis not present

## 2018-07-03 DIAGNOSIS — R1013 Epigastric pain: Secondary | ICD-10-CM | POA: Diagnosis not present

## 2018-07-03 DIAGNOSIS — K56609 Unspecified intestinal obstruction, unspecified as to partial versus complete obstruction: Secondary | ICD-10-CM | POA: Diagnosis not present

## 2018-07-03 DIAGNOSIS — K5730 Diverticulosis of large intestine without perforation or abscess without bleeding: Secondary | ICD-10-CM | POA: Diagnosis not present

## 2018-07-03 DIAGNOSIS — E872 Acidosis: Secondary | ICD-10-CM | POA: Diagnosis not present

## 2018-07-03 DIAGNOSIS — K219 Gastro-esophageal reflux disease without esophagitis: Secondary | ICD-10-CM | POA: Diagnosis not present

## 2018-07-03 DIAGNOSIS — J45909 Unspecified asthma, uncomplicated: Secondary | ICD-10-CM | POA: Diagnosis not present

## 2018-07-03 DIAGNOSIS — R932 Abnormal findings on diagnostic imaging of liver and biliary tract: Secondary | ICD-10-CM | POA: Diagnosis not present

## 2018-07-03 DIAGNOSIS — K56600 Partial intestinal obstruction, unspecified as to cause: Secondary | ICD-10-CM | POA: Diagnosis not present

## 2018-07-09 MED ORDER — FAMOTIDINE 20 MG/2ML IV SOLN
20.00 | INTRAVENOUS | Status: DC
Start: 2018-07-07 — End: 2018-07-09

## 2018-07-09 MED ORDER — GENERIC EXTERNAL MEDICATION
10.00 | Status: DC
Start: ? — End: 2018-07-09

## 2018-07-09 MED ORDER — SODIUM CHLORIDE 0.9 % IV SOLN
10.00 | INTRAVENOUS | Status: DC
Start: ? — End: 2018-07-09

## 2018-07-09 MED ORDER — GENERIC EXTERNAL MEDICATION
650.00 | Status: DC
Start: ? — End: 2018-07-09

## 2018-07-09 MED ORDER — ENOXAPARIN SODIUM 40 MG/0.4ML ~~LOC~~ SOLN
40.00 | SUBCUTANEOUS | Status: DC
Start: 2018-07-07 — End: 2018-07-09

## 2018-07-09 MED ORDER — PHENOL 1.4 % MT LIQD
2.00 | OROMUCOSAL | Status: DC
Start: ? — End: 2018-07-09

## 2018-07-09 MED ORDER — HYDROMORPHONE HCL 1 MG/ML IJ SOLN
0.20 | INTRAMUSCULAR | Status: DC
Start: ? — End: 2018-07-09

## 2018-07-09 MED ORDER — HYDRALAZINE HCL 20 MG/ML IJ SOLN
5.00 | INTRAMUSCULAR | Status: DC
Start: ? — End: 2018-07-09

## 2018-07-09 MED ORDER — SODIUM CHLORIDE 0.45 % IV SOLN
100.00 | INTRAVENOUS | Status: DC
Start: ? — End: 2018-07-09

## 2018-07-28 ENCOUNTER — Encounter: Payer: Self-pay | Admitting: Internal Medicine

## 2018-08-04 ENCOUNTER — Other Ambulatory Visit: Payer: Self-pay | Admitting: Family Medicine

## 2018-08-09 DIAGNOSIS — M9901 Segmental and somatic dysfunction of cervical region: Secondary | ICD-10-CM | POA: Diagnosis not present

## 2018-08-09 DIAGNOSIS — M50322 Other cervical disc degeneration at C5-C6 level: Secondary | ICD-10-CM | POA: Diagnosis not present

## 2018-08-09 DIAGNOSIS — M50321 Other cervical disc degeneration at C4-C5 level: Secondary | ICD-10-CM | POA: Diagnosis not present

## 2018-08-09 DIAGNOSIS — M50323 Other cervical disc degeneration at C6-C7 level: Secondary | ICD-10-CM | POA: Diagnosis not present

## 2018-08-26 ENCOUNTER — Ambulatory Visit: Payer: BLUE CROSS/BLUE SHIELD | Admitting: Internal Medicine

## 2018-08-26 ENCOUNTER — Encounter: Payer: Self-pay | Admitting: Internal Medicine

## 2018-08-26 VITALS — BP 112/70 | HR 84 | Ht 70.0 in | Wt 138.5 lb

## 2018-08-26 DIAGNOSIS — K5900 Constipation, unspecified: Secondary | ICD-10-CM | POA: Diagnosis not present

## 2018-08-26 DIAGNOSIS — K56609 Unspecified intestinal obstruction, unspecified as to partial versus complete obstruction: Secondary | ICD-10-CM | POA: Diagnosis not present

## 2018-08-26 DIAGNOSIS — Z86010 Personal history of colonic polyps: Secondary | ICD-10-CM | POA: Diagnosis not present

## 2018-08-26 DIAGNOSIS — K219 Gastro-esophageal reflux disease without esophagitis: Secondary | ICD-10-CM

## 2018-08-26 NOTE — Progress Notes (Signed)
HISTORY OF PRESENT ILLNESS:  Jacob Blanchard is a 62 y.o. male with a history of GERD complicated by Barrett's esophagus and adenomatous colon polyps who was sent here at the recommendation of his general surgeon and primary care physician regarding constipation.  The patient has not been seen in this office since February 2013 regarding his GI conditions and the need for surveillance endoscopy and surveillance colonoscopy.  At that time the patient stated that he would contact the office to set up his examinations.  He did not follow through.  I have reviewed extensive outside records, x-rays, operative notes, and laboratories.  In short, the patient presented to [facility] July 03, 2018 with a high-grade small bowel obstruction as demonstrated on CT scan.  No previous abdominal surgeries.  Laboratories were remarkable for elevated white blood cell count at 19.2.  On that same day Dr. Damita Blanchard performed emergent laparoscopy with lysis of omental adhesions.  He recovered uneventfully and was discharged home.  Thereafter the patient complained of issues with constipation.  He has completed his postoperative follow-up.  Because of ongoing complaints he was told to follow-up with his GI doctor.  For his constipation he has taken various agents at various dosages for various amounts of time.  These include fiber, MiraLAX, and Dulcolax.  MiraLAX was used at 1 dose daily and initially worked.  Not so much recently thus discontinued.  On demand Dulcolax works.  He denies further problems with abdominal pain.  No bleeding.  For his chronic GERD he takes Prevacid daily.  On medication no active symptoms.  His last upper endoscopy was October 2009.  Nondysplastic short segment Barrett's identified.  Follow-up in 3 years recommended.  His last complete colonoscopy was performed November 2007.  In addition to diverticulosis he was noted to have 2 subcentimeter adenomatous colon polyps.  Follow-up in 5 years  was recommended.  REVIEW OF SYSTEMS:  All non-GI ROS negative less otherwise stated in the HPI   Past Medical History:  Diagnosis Date  . Barrett's esophagus    Bx + 2009 and 2007  . Chronic asthma   . Contact dermatitis and other eczema, due to unspecified cause    +scarring  . Epididymal cyst 06/2018   Bilat: small on R, but 4 cm on left  Asymptomatic.  Marland Kitchen GERD (gastroesophageal reflux disease)   . History of adenomatous polyp of colon 08/10/06  . Hyperlipidemia   . Left foot pain    ? Arch strain.  Some numbness to suggest possible tarsal tunnel syndrome vs lumbar radiculitis (sports med MD).  Responded very well to custom orthotic.  . Migraine syndrome    verapamil prophylaxis  . Seasonal and perennial allergic rhinitis    vaccine/injection therapy helpful  . Small bowel obstruction Jacob Blanchard)     Past Surgical History:  Procedure Laterality Date  . ABDOMINAL EXPLORATION SURGERY    . COLONOSCOPY W/ POLYPECTOMY  08/10/06   Several adenomatous polyps  . ESOPHAGOGASTRODUODENOSCOPY  2007 and 07/20/08   GERD, BARRET's esoph (Dr. Henrene Blanchard)  . LE venous doppler u/s  04/19/15   L leg NORMAL  . right knee arthroscopy  1980s   Torn meniscus (Dr. Noemi Blanchard)    Social History Jacob Blanchard  reports that he quit smoking about 25 years ago. His smoking use included cigarettes. He has a 10.00 pack-year smoking history. He has never used smokeless tobacco. He reports that he drinks alcohol. He reports that he does not use drugs.  family  history includes Other in his father.  No Known Allergies     PHYSICAL EXAMINATION: Vital signs: BP 112/70 (BP Location: Left Arm, Patient Position: Sitting, Cuff Size: Normal)   Pulse 84   Ht 5\' 10"  (1.778 m) Comment: pt stated height  Wt 138 lb 8 oz (62.8 kg)   BMI 19.87 kg/m   Constitutional: generally well-appearing, no acute distress Psychiatric: alert and oriented x3, cooperative Eyes: extraocular movements intact, anicteric, conjunctiva  pink Mouth: oral pharynx moist, no lesions Neck: supple no lymphadenopathy Cardiovascular: heart regular rate and rhythm, no murmur Lungs: clear to auscultation bilaterally Abdomen: soft, nontender, nondistended, no obvious ascites, no peritoneal signs, normal bowel sounds, no organomegaly.  Laparoscopy incision sites well-healed Rectal: Deferred to colonoscopy Extremities: no rubbing, cyanosis, or lower extremity edema bilaterally Skin: no lesions on visible extremities Neuro: No focal deficits.  Cranial nerves intact  ASSESSMENT:  1.  Recent small bowel obstruction status post laparoscopy with lysis of adhesions.  He has made a clinical recovery 2.  Chronic constipation.  Exacerbated by recent illness and medications. 3.  History of adenomatous colon polyps 2007.  Well overdue for follow-up.  He is aware 4.  Chronic GERD.  Symptoms controlled with PPI 5.  Nondysplastic Barrett's esophagus.  Last upper endoscopy 2009.  Well overdue for follow-up.  He is aware   PLAN:  1.  For chronic constipation we discussed daily use of MiraLAX and how to appropriately titrate.  Okay to use Dulcolax as well 2.  Schedule surveillance colonoscopy.The nature of the procedure, as well as the risks, benefits, and alternatives were carefully and thoroughly reviewed with the patient. Ample time for discussion and questions allowed. The patient understood, was satisfied, and agreed to proceed. 3.  Schedule surveillance upper endoscopy.The nature of the procedure, as well as the risks, benefits, and alternatives were carefully and thoroughly reviewed with the patient. Ample time for discussion and questions allowed. The patient understood, was satisfied, and agreed to proceed. 4.  Continue Prevacid for chronic GERD and Barrett's esophagus  Patient states that he will consult with his wife regarding timing of his procedures.  He states that he will contact the office to arrange colonoscopy and upper endoscopy  concurrently.  He will see one of our previsit nurses in this regard.  The importance of compliance stressed.

## 2018-08-26 NOTE — Patient Instructions (Signed)
Please call back when you are ready to schedule your endoscopy/colonoscopy

## 2018-09-13 DIAGNOSIS — M50322 Other cervical disc degeneration at C5-C6 level: Secondary | ICD-10-CM | POA: Diagnosis not present

## 2018-09-13 DIAGNOSIS — M9901 Segmental and somatic dysfunction of cervical region: Secondary | ICD-10-CM | POA: Diagnosis not present

## 2018-09-13 DIAGNOSIS — M50321 Other cervical disc degeneration at C4-C5 level: Secondary | ICD-10-CM | POA: Diagnosis not present

## 2018-09-13 DIAGNOSIS — M50323 Other cervical disc degeneration at C6-C7 level: Secondary | ICD-10-CM | POA: Diagnosis not present

## 2018-09-15 ENCOUNTER — Ambulatory Visit (INDEPENDENT_AMBULATORY_CARE_PROVIDER_SITE_OTHER): Payer: BLUE CROSS/BLUE SHIELD

## 2018-09-15 DIAGNOSIS — Z23 Encounter for immunization: Secondary | ICD-10-CM | POA: Diagnosis not present

## 2018-09-15 NOTE — Progress Notes (Signed)
Pt came into office today for his 2nd Shingrix. Last given 06/23/18.  Pt tolerated well w no side effects noted.

## 2018-10-18 DIAGNOSIS — M50322 Other cervical disc degeneration at C5-C6 level: Secondary | ICD-10-CM | POA: Diagnosis not present

## 2018-10-18 DIAGNOSIS — M50321 Other cervical disc degeneration at C4-C5 level: Secondary | ICD-10-CM | POA: Diagnosis not present

## 2018-10-18 DIAGNOSIS — M50323 Other cervical disc degeneration at C6-C7 level: Secondary | ICD-10-CM | POA: Diagnosis not present

## 2018-10-18 DIAGNOSIS — M9901 Segmental and somatic dysfunction of cervical region: Secondary | ICD-10-CM | POA: Diagnosis not present

## 2018-11-14 ENCOUNTER — Encounter: Payer: Self-pay | Admitting: Internal Medicine

## 2018-11-22 DIAGNOSIS — M50323 Other cervical disc degeneration at C6-C7 level: Secondary | ICD-10-CM | POA: Diagnosis not present

## 2018-11-22 DIAGNOSIS — M9901 Segmental and somatic dysfunction of cervical region: Secondary | ICD-10-CM | POA: Diagnosis not present

## 2018-11-22 DIAGNOSIS — M50322 Other cervical disc degeneration at C5-C6 level: Secondary | ICD-10-CM | POA: Diagnosis not present

## 2018-11-22 DIAGNOSIS — M50321 Other cervical disc degeneration at C4-C5 level: Secondary | ICD-10-CM | POA: Diagnosis not present

## 2018-12-05 ENCOUNTER — Ambulatory Visit (AMBULATORY_SURGERY_CENTER): Payer: Self-pay

## 2018-12-05 ENCOUNTER — Encounter: Payer: Self-pay | Admitting: Internal Medicine

## 2018-12-05 VITALS — Ht 71.0 in | Wt 145.4 lb

## 2018-12-05 DIAGNOSIS — Z860100 Personal history of colon polyps, unspecified: Secondary | ICD-10-CM

## 2018-12-05 DIAGNOSIS — K2270 Barrett's esophagus without dysplasia: Secondary | ICD-10-CM

## 2018-12-05 MED ORDER — NA SULFATE-K SULFATE-MG SULF 17.5-3.13-1.6 GM/177ML PO SOLN: 1.0000 | Freq: Once | ORAL | 0 refills | Status: AC

## 2018-12-05 NOTE — Progress Notes (Signed)
Denies allergies to eggs or soy products. Denies complication of anesthesia or sedation. Denies use of weight loss medication. Denies use of O2.   Emmi instructions declined.   A pay no more than 50.00 coupon for Suprep was given to the patient.  

## 2018-12-21 ENCOUNTER — Telehealth: Payer: Self-pay

## 2018-12-21 NOTE — Telephone Encounter (Signed)
Covid-19 travel screening questions  Have you traveled in the last 14 days? No If yes where?  Do you now or have you had a fever in the last 14 days? No Do you have any respiratory symptoms of shortness of breath or cough now or in the last 14 days? No Do you have a medical history of Congestive Heart Failure?  Do you have a medical history of lung disease?  Do you have any family members or close contacts with diagnosed or suspected Covid-19? No      

## 2018-12-22 ENCOUNTER — Ambulatory Visit (AMBULATORY_SURGERY_CENTER): Payer: BLUE CROSS/BLUE SHIELD | Admitting: Internal Medicine

## 2018-12-22 ENCOUNTER — Other Ambulatory Visit: Payer: Self-pay

## 2018-12-22 ENCOUNTER — Encounter: Payer: Self-pay | Admitting: Internal Medicine

## 2018-12-22 VITALS — BP 126/85 | HR 72 | Temp 98.6°F | Resp 20 | Ht 71.0 in | Wt 145.0 lb

## 2018-12-22 DIAGNOSIS — K2270 Barrett's esophagus without dysplasia: Secondary | ICD-10-CM | POA: Diagnosis not present

## 2018-12-22 DIAGNOSIS — D123 Benign neoplasm of transverse colon: Secondary | ICD-10-CM | POA: Diagnosis not present

## 2018-12-22 DIAGNOSIS — D120 Benign neoplasm of cecum: Secondary | ICD-10-CM | POA: Diagnosis not present

## 2018-12-22 DIAGNOSIS — K2970 Gastritis, unspecified, without bleeding: Secondary | ICD-10-CM | POA: Diagnosis not present

## 2018-12-22 DIAGNOSIS — K3189 Other diseases of stomach and duodenum: Secondary | ICD-10-CM

## 2018-12-22 DIAGNOSIS — Z86010 Personal history of colonic polyps: Secondary | ICD-10-CM | POA: Diagnosis not present

## 2018-12-22 DIAGNOSIS — Z1211 Encounter for screening for malignant neoplasm of colon: Secondary | ICD-10-CM | POA: Diagnosis not present

## 2018-12-22 DIAGNOSIS — K449 Diaphragmatic hernia without obstruction or gangrene: Secondary | ICD-10-CM | POA: Diagnosis not present

## 2018-12-22 MED ORDER — SODIUM CHLORIDE 0.9 % IV SOLN: 500.0000 mL | Freq: Once | INTRAVENOUS | Status: DC

## 2018-12-22 NOTE — Progress Notes (Signed)
Pt's states no medical or surgical changes since previsit or office visit. 

## 2018-12-22 NOTE — Op Note (Addendum)
[facility] Patient Name: Jacob Blanchard Procedure Date: 12/22/2018 8:28 AM MRN: [id number] Endoscopist: Docia Chuck. Henrene Pastor , MD Age: 63 Referring MD:  Date of Birth: [birth year] Gender: Male Account #: [id number] Procedure:                Colonoscopy with snare polypectomy x 4; with                            submucosal injection Indications:              High risk colon cancer surveillance: Personal                            history of non-advanced adenomas. Previous                            examination 2007. Overdue for follow-up Medicines:                Monitored Anesthesia Care Procedure:                Pre-Anesthesia Assessment:                           - Prior to the procedure, a History and Physical                            was performed, and patient medications and                            allergies were reviewed. The patient's tolerance of                            previous anesthesia was also reviewed. The risks                            and benefits of the procedure and the sedation                            options and risks were discussed with the patient.                            All questions were answered, and informed consent                            was obtained. Prior Anticoagulants: The patient has                            taken no previous anticoagulant or antiplatelet                            agents. ASA Grade Assessment: II - A patient with                            mild systemic disease. After reviewing the risks  and benefits, the patient was deemed in                            satisfactory condition to undergo the procedure.                           After obtaining informed consent, the colonoscope                            was passed under direct vision. Throughout the                            procedure, the patient's blood pressure, pulse, and                            oxygen saturations were  monitored continuously. The                            Colonoscope was introduced through the anus and                            advanced to the the cecum, identified by                            appendiceal orifice and ileocecal valve. The                            ileocecal valve, appendiceal orifice, and rectum                            were photographed. The quality of the bowel                            preparation was excellent. The colonoscopy was                            performed without difficulty. The patient tolerated                            the procedure well. The bowel preparation used was                            SUPREP. Scope In: 10:03:31 AM Scope Out: 10:32:27 AM Scope Withdrawal Time: 0 hours 22 minutes 13 seconds  Total Procedure Duration: 0 hours 28 minutes 56 seconds  Findings:                 A 5 mm polyp was found in the transverse colon. The                            polyp was removed with a cold snare. Resection and                            retrieval were complete.  Three sessile polyps were found in the cecum. The                            polyps were 5, 10, and 15 mm in size. The 2 larger                            polyps were successfully injected with saline for a                            lift polypectomy. These polyps were removed with a                            cold snare. Resection and retrieval were complete.                           Multiple small and large-mouthed diverticula were                            found in the left colon.                           The exam was otherwise without abnormality on                            direct and retroflexion views. Complications:            No immediate complications. Estimated blood loss:                            None. Estimated Blood Loss:     Estimated blood loss: none. Impression:               - One 5 mm polyp in the transverse colon, removed                             with a cold snare. Resected and retrieved.                           - Three 5 to 15 mm polyps in the cecum, removed                            with a cold snare. Resected and retrieved. Injected.                           - Diverticulosis in the left colon.                           - The examination was otherwise normal on direct                            and retroflexion views. Recommendation:           - Repeat colonoscopy in 1 year for surveillance.                           -  Patient has a contact number available for                            emergencies. The signs and symptoms of potential                            delayed complications were discussed with the                            patient. Return to normal activities tomorrow.                            Written discharge instructions were provided to the                            patient.                           - Resume previous diet.                           - Continue present medications.                           - Await pathology results. Docia Chuck. Henrene Pastor, MD 12/22/2018 11:00:17 AM This report has been signed electronically.

## 2018-12-22 NOTE — Progress Notes (Signed)
PT taken to PACU. Monitors in place. VSS. Report given to RN. 

## 2018-12-22 NOTE — Op Note (Signed)
[place] Patient Name: Jacob Blanchard Procedure Date: 12/22/2018 8:26 AM MRN: [id number] Endoscopist: Docia Chuck. Henrene Pastor , MD Age: 63 Referring MD:  Date of Birth: July 27, 1956 Gender: Male Account #: [id number] Procedure:                Upper GI endoscopy with use Indications:              Surveillance for malignancy due to personal history                            of Barrett's esophagus Medicines:                Monitored Anesthesia Care Procedure:                Pre-Anesthesia Assessment:                           - Prior to the procedure, a History and Physical                            was performed, and patient medications and                            allergies were reviewed. The patient's tolerance of                            previous anesthesia was also reviewed. The risks                            and benefits of the procedure and the sedation                            options and risks were discussed with the patient.                            All questions were answered, and informed consent                            was obtained. Prior Anticoagulants: The patient has                            taken no previous anticoagulant or antiplatelet                            agents. ASA Grade Assessment: II - A patient with                            mild systemic disease. After reviewing the risks                            and benefits, the patient was deemed in                            satisfactory condition to undergo the procedure.  After obtaining informed consent, the endoscope was                            passed under direct vision. Throughout the                            procedure, the patient's blood pressure, pulse, and                            oxygen saturations were monitored continuously. The                            Endoscope was introduced through the mouth, and                            advanced to the second  part of duodenum. The upper                            GI endoscopy was accomplished without difficulty.                            The patient tolerated the procedure well. Scope In: Scope Out: Findings:                 There were esophageal mucosal changes secondary to                            established Barrett's disease present in the lower                            third of the esophagus. The maximum longitudinal                            extent of these mucosal changes was 0.5 cm in                            length (2 diminutive tongues). Mucosa was biopsied                            with a cold large-capacity forceps for histology.                            As well there was a large caliber distal esophageal                            ring or stricture.                           The stomach was normal except for a small sliding                            hiatal hernia.  The examined duodenum was normal.                           The cardia and gastric fundus were normal on                            retroflexion. Complications:            No immediate complications. Estimated Blood Loss:     Estimated blood loss: none. Impression:               1. GERD                           2. Diminutive Barrett's type mucosa status post                            biopsies                           3. Otherwise unremarkable exam. Recommendation:           - Patient has a contact number available for                            emergencies. The signs and symptoms of potential                            delayed complications were discussed with the                            patient. Return to normal activities tomorrow.                            Written discharge instructions were provided to the                            patient.                           - Resume previous diet.                           - Continue present medications.                            - Await pathology results. Docia Chuck. Henrene Pastor, MD 12/22/2018 11:08:55 AM This report has been signed electronically.

## 2018-12-22 NOTE — Patient Instructions (Signed)
Information on polyps an d diverticulosis given to you today.  Await pathology results.  YOU HAD AN ENDOSCOPIC PROCEDURE TODAY AT [facility]:   Refer to the procedure report that was given to you for any specific questions about what was found during the examination.  If the procedure report does not answer your questions, please call your gastroenterologist to clarify.  If you requested that your care partner not be given the details of your procedure findings, then the procedure report has been included in a sealed envelope for you to review at your convenience later.  YOU SHOULD EXPECT: Some feelings of bloating in the abdomen. Passage of more gas than usual.  Walking can help get rid of the air that was put into your GI tract during the procedure and reduce the bloating. If you had a lower endoscopy (such as a colonoscopy or flexible sigmoidoscopy) you may notice spotting of blood in your stool or on the toilet paper. If you underwent a bowel prep for your procedure, you may not have a normal bowel movement for a few days.  Please Note:  You might notice some irritation and congestion in your nose or some drainage.  This is from the oxygen used during your procedure.  There is no need for concern and it should clear up in a day or so.  SYMPTOMS TO REPORT IMMEDIATELY:   Following lower endoscopy (colonoscopy or flexible sigmoidoscopy):  Excessive amounts of blood in the stool  Significant tenderness or worsening of abdominal pains  Swelling of the abdomen that is new, acute  Fever of 100F or higher   Following upper endoscopy (EGD)  Vomiting of blood or coffee ground material  New chest pain or pain under the shoulder blades  Painful or persistently difficult swallowing  New shortness of breath  Fever of 100F or higher  Black, tarry-looking stools  For urgent or emergent issues, a gastroenterologist can be reached at any hour by calling [phone].   DIET:   We do recommend a small meal at first, but then you may proceed to your regular diet.  Drink plenty of fluids but you should avoid alcoholic beverages for 24 hours.  ACTIVITY:  You should plan to take it easy for the rest of today and you should NOT DRIVE or use heavy machinery until tomorrow (because of the sedation medicines used during the test).    FOLLOW UP: Our staff will call the number listed on your records the next business day following your procedure to check on you and address any questions or concerns that you may have regarding the information given to you following your procedure. If we do not reach you, we will leave a message.  However, if you are feeling well and you are not experiencing any problems, there is no need to return our call.  We will assume that you have returned to your regular daily activities without incident.  If any biopsies were taken you will be contacted by phone or by letter within the next 1-3 weeks.  Please call us at [phone] if you have not heard about the biopsies in 3 weeks.    SIGNATURES/CONFIDENTIALITY: You and/or your care partner have signed paperwork which will be entered into your electronic medical record.  These signatures attest to the fact that that the information above on your After Visit Summary has been reviewed and is understood.  Full responsibility of the confidentiality of this discharge information lies with you  and/or your care-partner. 

## 2018-12-22 NOTE — Progress Notes (Signed)
Called to room to assist during endoscopic procedure.  Patient ID and intended procedure confirmed with present staff. Received instructions for my participation in the procedure from the performing physician.  

## 2018-12-23 ENCOUNTER — Telehealth: Payer: Self-pay | Admitting: *Deleted

## 2018-12-23 NOTE — Telephone Encounter (Signed)
  Follow up Call-  Call back number 12/22/2018  Post procedure Call Back phone  # [phone]  Permission to leave phone message Yes  Some recent data might be hidden     Patient questions:  Do you have a fever, pain , or abdominal swelling? Yes.   Pain Score  3 headache  Have you tolerated food without any problems? Hasn't eaten since last night/no reason noted  Have you been able to return to your normal activities? Yes.    Do you have any questions about your discharge instructions: Diet   No. Medications  No. Follow up visit  No.  Do you have questions or concerns about your Care? Yes.    Actions: * If pain score is 4 or above: No action needed, pain <4.  Patient instructed to have something to eat, and have a cup of coffee.  He is to call us if ne feels worse.

## 2018-12-23 NOTE — Telephone Encounter (Signed)
No answer for post procedure call back. Left message and will attempt to call back later this afternoon. SM 

## 2018-12-27 ENCOUNTER — Encounter: Payer: Self-pay | Admitting: Internal Medicine

## 2019-01-10 DIAGNOSIS — M50323 Other cervical disc degeneration at C6-C7 level: Secondary | ICD-10-CM | POA: Diagnosis not present

## 2019-01-10 DIAGNOSIS — M50322 Other cervical disc degeneration at C5-C6 level: Secondary | ICD-10-CM | POA: Diagnosis not present

## 2019-01-10 DIAGNOSIS — M50321 Other cervical disc degeneration at C4-C5 level: Secondary | ICD-10-CM | POA: Diagnosis not present

## 2019-01-10 DIAGNOSIS — M9901 Segmental and somatic dysfunction of cervical region: Secondary | ICD-10-CM | POA: Diagnosis not present

## 2019-01-25 ENCOUNTER — Encounter: Payer: Self-pay | Admitting: Family Medicine

## 2019-01-25 ENCOUNTER — Telehealth: Payer: Self-pay | Admitting: Internal Medicine

## 2019-01-25 NOTE — Telephone Encounter (Signed)
Patient states he is having a lot of gas and burning after eating A LOT.  Also a pain thru his armpit to the heart. CB# [phone]

## 2019-01-25 NOTE — Telephone Encounter (Signed)
Spoke with patient who reported 3-4 weeks of "terrible gas and right armpit pain." The patient has been taking Gas-X BID for 2 weeks and Prevacid 30 mg BID for 2-3 years with no relief. The patient reports that the pain is worse at night. The patient reports this pain with "everything" he eats but has eliminated dairy and soda in hopes that this would bring relief but it has not. Patient scheduled for zoom visit 4/23 at 10:30 am.

## 2019-01-26 ENCOUNTER — Ambulatory Visit (INDEPENDENT_AMBULATORY_CARE_PROVIDER_SITE_OTHER): Payer: BLUE CROSS/BLUE SHIELD | Admitting: Internal Medicine

## 2019-01-26 ENCOUNTER — Other Ambulatory Visit: Payer: Self-pay

## 2019-01-26 ENCOUNTER — Encounter: Payer: Self-pay | Admitting: Internal Medicine

## 2019-01-26 VITALS — BP 126/85 | HR 72 | Ht 69.0 in | Wt 145.0 lb

## 2019-01-26 DIAGNOSIS — F411 Generalized anxiety disorder: Secondary | ICD-10-CM

## 2019-01-26 DIAGNOSIS — R0789 Other chest pain: Secondary | ICD-10-CM | POA: Diagnosis not present

## 2019-01-26 DIAGNOSIS — K2270 Barrett's esophagus without dysplasia: Secondary | ICD-10-CM | POA: Diagnosis not present

## 2019-01-26 DIAGNOSIS — Z86010 Personal history of colonic polyps: Secondary | ICD-10-CM

## 2019-01-26 DIAGNOSIS — K9289 Other specified diseases of the digestive system: Secondary | ICD-10-CM

## 2019-01-26 DIAGNOSIS — K219 Gastro-esophageal reflux disease without esophagitis: Secondary | ICD-10-CM | POA: Diagnosis not present

## 2019-01-27 ENCOUNTER — Encounter: Payer: Self-pay | Admitting: Family Medicine

## 2019-01-27 ENCOUNTER — Ambulatory Visit (INDEPENDENT_AMBULATORY_CARE_PROVIDER_SITE_OTHER): Payer: BLUE CROSS/BLUE SHIELD | Admitting: Family Medicine

## 2019-01-27 ENCOUNTER — Ambulatory Visit: Payer: BLUE CROSS/BLUE SHIELD

## 2019-01-27 VITALS — BP 122/82 | HR 62

## 2019-01-27 DIAGNOSIS — R0789 Other chest pain: Secondary | ICD-10-CM

## 2019-01-27 DIAGNOSIS — R1013 Epigastric pain: Secondary | ICD-10-CM | POA: Diagnosis not present

## 2019-01-27 DIAGNOSIS — T148XXA Other injury of unspecified body region, initial encounter: Secondary | ICD-10-CM | POA: Diagnosis not present

## 2019-01-27 DIAGNOSIS — R142 Eructation: Secondary | ICD-10-CM

## 2019-01-27 DIAGNOSIS — X503XXA Overexertion from repetitive movements, initial encounter: Secondary | ICD-10-CM

## 2019-01-27 MED ORDER — ALPRAZOLAM 0.5 MG PO TABS: ORAL_TABLET | ORAL | 0 refills | Status: DC

## 2019-01-27 MED ORDER — SUCRALFATE 1 G PO TABS: 1.0000 g | ORAL_TABLET | Freq: Three times a day (TID) | ORAL | 1 refills | Status: DC

## 2019-01-27 NOTE — Progress Notes (Signed)
Virtual Visit via Video Note  I connected with pt  on 01/27/19 at  1:00 PM EDT by a video enabled telemedicine application and verified that I am speaking with the correct person using two identifiers.  Location patient: home Location provider:work or home office Persons participating in the virtual visit: patient, provider  I discussed the limitations of evaluation and management by telemedicine and the availability of in person appointments. The patient expressed understanding and agreed to proceed.   HPI: 63 y/o WM being seen today for "terrible gas and right armpit pain".  Pt says he feels like he has bad indigestion. Feels a discomfort pressure/pain in left upper chest area and into R axillary area and L scapular region, feels the need to burp a lot. Onset about 2-3 wks ago.  Occurs at rest.  He does not do any real exertion, but the symptoms don't change with doing household and yard chores.  He has been doing more things lately like cleaning his gutters, steering lawnmower for long periods, and power washing his house. No nausea but feels like he needs to burp a lot and this makes him feel a little relief.  No diaphoresis, no SOB, no palpitations.  Sx's not getting more frequent or progressively worsening.   Happens no matter what he eats.  He eliminated milk and fizzy drinks and this didn't help.  Even water makes him feel the sx's.  Denies any sensation of rising burning sensation up from stomach into central chest. No NSAIDs.  Tried taking alka seltzer and he got no significant relief. Drinks a shot of vodka with lemonade with each meal but has done this many years''>"helps calm me a little".  Lots of anxiety lately due to covid-19 and losing his job.  Lots of financial worries, family worries. He has been compliant with his 30mg  prevacid bid.   BP 120s/80s, P 80s-low 90s. No cough or fever.  He feels like he is a bit tender when he palpates side of pectoral region on the left. No  rash or skin/subQ lesions.  He reports having a similar episode in 2015-went to ED, got GI cocktail and it helped well and quickly. Increased PPI after that and all sx's subsided.  ROS:  no wheezing  no dizziness, no HAs,no diarrhea, no melena/hematochezia.  No polyuria or polydipsia.  No myalgias or arthralgias.   Past Medical History:  Diagnosis Date  . Barrett's esophagus    Bx+ 2007, 2009, and 2020.  Marland Kitchen Chronic asthma    Patient denies  . Chronic constipation   . Contact dermatitis and other eczema, due to unspecified cause    +scarring  . Epididymal cyst 06/2018   Bilat: small on R, but 4 cm on left  Asymptomatic.  Marland Kitchen GERD (gastroesophageal reflux disease)   . History of adenomatous polyp of colon 08/10/2006   2007 and 12/2018; recall 12/2019.  Marland Kitchen Hyperlipidemia   . Left foot pain    ? Arch strain.  Some numbness to suggest possible tarsal tunnel syndrome vs lumbar radiculitis (sports med MD).  Responded very well to custom orthotic.  . Migraine syndrome    verapamil prophylaxis  . Seasonal and perennial allergic rhinitis    vaccine/injection therapy helpful  . Small bowel obstruction ([place]) Fall of 2019   surgery->lysis of adhesions    Past Surgical History:  Procedure Laterality Date  . ABDOMINAL EXPLORATION SURGERY  Fall 2019   SBO->lysis of adhesions  . COLONOSCOPY W/ POLYPECTOMY  08/10/06; 12/2018  Several adenomatous polyps 2007 and 2020; recall 12/2019  . ESOPHAGOGASTRODUODENOSCOPY  2007 and 07/20/08;12/2018   GERD, BARRET's esoph (Dr. Henrene Pastor). Same 12/2018.  Marland Kitchen LE venous doppler u/s  04/19/15   L leg NORMAL  . right knee arthroscopy  1980s   Torn meniscus (Dr. Noemi Chapel)    Family History  Problem Relation Age of Onset  . Other Father        chemical PNA siphoning gas  . Colon cancer Neg Hx   . Esophageal cancer Neg Hx   . Rectal cancer Neg Hx   . Stomach cancer Neg Hx      Current Outpatient Medications:  .  atorvastatin (LIPITOR) 40 MG tablet, TAKE 1 TABLET  BY MOUTH EVERY DAY, Disp: 30 tablet, Rfl: 5 .  bisacodyl (DULCOLAX) 5 MG EC tablet, Take 5 mg by mouth daily., Disp: , Rfl:  .  fluticasone (FLONASE) 50 MCG/ACT nasal spray, Place 2 sprays into both nostrils daily., Disp: 16 g, Rfl: 11 .  lansoprazole (PREVACID) 30 MG capsule, TAKE 1 CAPSULE (30 MG TOTAL) BY MOUTH 2 (TWO) TIMES DAILY., Disp: 60 capsule, Rfl: 6 .  polyethylene glycol (MIRALAX / GLYCOLAX) 17 g packet, Take 17 g by mouth daily., Disp: , Rfl:  .  psyllium (REGULOID) 0.52 g capsule, Take 0.52 g by mouth daily. Patient takes 4 Tablespoons daily., Disp: , Rfl:  .  verapamil (CALAN) 120 MG tablet, Take 1 tablet by mouth daily. , Disp: , Rfl: 2 .  cyclobenzaprine (FLEXERIL) 10 MG tablet, Take 10 mg by mouth 2 (two) times daily as needed., Disp: , Rfl: 1 .  fexofenadine (ALLEGRA) 180 MG tablet, Take 1 tablet (180 mg total) by mouth daily. (Patient not taking: Reported on 01/26/2019), Disp: 30 tablet, Rfl: 11 .  Ibuprofen (ADVIL) 200 MG CAPS, Take by mouth.  , Disp: , Rfl:   EXAM:  VITALS per patient if applicable: BP 680/32 (BP Location: Left Arm, Patient Position: Sitting, Cuff Size: Normal)   Pulse 78    GENERAL: alert, oriented, appears well and in no acute distress  HEENT: atraumatic, conjunttiva clear, no obvious abnormalities on inspection of external nose and ears  NECK: normal movements of the head and neck  LUNGS: on inspection no signs of respiratory distress, breathing rate appears normal, no obvious gross SOB, gasping or wheezing  CV: no obvious cyanosis  MS: moves all visible extremities without noticeable abnormality  PSYCH/NEURO: pleasant and cooperative, no obvious depression or anxiety, speech and thought processing grossly intact  ASSESSMENT AND PLAN:  Discussed the following assessment and plan:  1) Left upper chest discomfort with excessive UGI gas/belching. Most likely gas/GER/dyspepsia: add carafate to bid prevacid.  We'll steer away from antacids at  this  Time. This seems to be directly correlated with increase in psychological/emotional stress lately with covid 19 crisis and the life changes it has forced on him.  I also think some of his discomfort is due to some overuse syndrome from extra work around home/yard lately. Low suspicion for cardiac etiology, but I would feel better if he had a normal EKG today, so I have recommended he come in to office to get this ASAP today.  2) Acute stress: try prn benzo for now-->discussed possible need for daily antidepressant if this anxiety continues and requires regular use of alprazolam.  Therapeutic expectations and side effect profile of medication discussed today.  Patient's questions answered. Alpraz 0.5mg , 1-2 bid prn, #30, no RF.   I discussed the assessment and  treatment plan with the patient. The patient was provided an opportunity to ask questions and all were answered. The patient agreed with the plan and demonstrated an understanding of the instructions.   The patient was advised to call back or seek an in-person evaluation if the symptoms worsen or if the condition fails to improve as anticipated.  F/u: 10 d f/u dyspepsia  Signed:  Crissie Sickles, MD           01/27/2019   ADDENDUM 01/27/2019, 2:20 pm Pt came to office to get bp check and EKG as I recommended when I did virtual visit earlier today.  BP in office 122/82. He has mild TTP in lateral aspect of pectoral muscles region where it abuts the axillary region.  No nodules  Mild TTP in L scap region diffusely. No rash.  12 lead EKG :  NSR, rate 80, no ectopy.  No ST or T wave abnormalities.  No Q waves. Normal PR and QT intervals, normal QRS duration.  No prior EKG to compare to visually, but a dictated interpretation of an EKG done 04/19/15 at [facility] states "NSR, nonspecific ST and T wave abnormality.  No previous EKG available".  Discussed result with pt.  No change in plans as stated in #1 and 2  above.  Signed:  Crissie Sickles, MD           01/27/2019

## 2019-01-29 ENCOUNTER — Encounter: Payer: Self-pay | Admitting: Internal Medicine

## 2019-01-29 NOTE — Progress Notes (Signed)
HISTORY OF PRESENT ILLNESS:  Jacob Blanchard is a 63 y.o. male who schedules this telemedicine visit for follow-up after undergoing endoscopic procedures.  He was last seen in the office August 26, 2018 after having undergone surgery for small bowel obstruction secondary to adhesions.  Other issues were chronic constipation, history of colon polyps overdue for follow-up, chronic GERD with nondysplastic Barrett's.  He was advised to use MiraLAX for his constipation.  Surveillance colonoscopy and upper endoscopy were recommended to be scheduled.  He eventually underwent those examinations August 24, 2019.  Colonoscopy revealed multiple polyps ranging between 5 and 15 mm including EMR resection of 2 lesions.  These were found to be adenomatous polyps.  Follow-up in 1 year recommended.  Patient did well post colonoscopy without issues.  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  He has continued on PPI.  The patient's chief concern today is atypical chest pain.  Something similar to what he had several years ago with negative work-up.  He is worried about heart condition.  He describes gas bloat.  Symptoms are seemingly worse after meals.  He does take Gas-X.  Is not clear that there is an exertional component.  Again he is quite concerned over possible cardiac source.  From a GI perspective he is doing well.  He continues on PPI.  No classic reflux symptoms.  No dysphasia.  No lower GI complaints.  His constipation has improved  REVIEW OF SYSTEMS:  All non-GI ROS negative unless otherwise stated in the HPI except for asthma  Past Medical History:  Diagnosis Date  . Barrett's esophagus    Bx+ 2007, 2009, and 2020.  Marland Kitchen Chronic asthma    Patient denies  . Chronic constipation   . Contact dermatitis and other eczema, due to unspecified cause    +scarring  . Epididymal cyst 06/2018   Bilat: small on R, but 4 cm on left  Asymptomatic.  Marland Kitchen GERD  (gastroesophageal reflux disease)   . History of adenomatous polyp of colon 08/10/2006   2007 and 12/2018; recall 12/2019.  Marland Kitchen Hyperlipidemia   . Left foot pain    ? Arch strain.  Some numbness to suggest possible tarsal tunnel syndrome vs lumbar radiculitis (sports med MD).  Responded very well to custom orthotic.  . Migraine syndrome    verapamil prophylaxis  . Seasonal and perennial allergic rhinitis    vaccine/injection therapy helpful  . Small bowel obstruction ([place]) Fall of 2019   surgery->lysis of adhesions    Past Surgical History:  Procedure Laterality Date  . ABDOMINAL EXPLORATION SURGERY  Fall 2019   SBO->lysis of adhesions  . COLONOSCOPY W/ POLYPECTOMY  08/10/06; 12/2018   Several adenomatous polyps 2007 and 2020; recall 12/2019  . ESOPHAGOGASTRODUODENOSCOPY  2007 and 07/20/08;12/2018   GERD, BARRET's esoph (Dr. Henrene Pastor). Same 12/2018.  Marland Kitchen LE venous doppler u/s  04/19/15   L leg NORMAL  . right knee arthroscopy  1980s   Torn meniscus (Dr. Noemi Chapel)    Social History Aldrick Derrig Rookstool  reports that he quit smoking about 25 years ago. His smoking use included cigarettes. He has a 10.00 pack-year smoking history. He has never used smokeless tobacco. He reports current alcohol use. He reports that he does not use drugs.  family history includes Other in his father.  No Known Allergies     PHYSICAL EXAMINATION: No physical examination with telemedicine visit    ASSESSMENT:  1.  GERD with short segment Barrett's esophagus.  Nondysplastic.  Asymptomatic on PPI 2.  Atypical chest pain/gas bloat/anxiety 3.  History of multiple adenomatous colon polyps with EMR as described   PLAN:  1.  Reflux precautions 2.  Discussion on gas bloat.  Discussed over-the-counter anti-gas remedies 3.  Continue PPI without change 4.  Advised to contact his PCP for a more formal evaluation of his chest pain complaints.  He agrees 5.  Plan surveillance colonoscopy in 1 year 6.  Interval  follow-up as needed  25 minutes spent with the patient during this telemedicine virtual evaluation.  Patient did consent to this evaluation and schedule the appointment.  He understands her may be an associated professional charge.  He was in his home and I was in my office during the encounter.

## 2019-02-10 ENCOUNTER — Encounter: Payer: Self-pay | Admitting: Family Medicine

## 2019-02-10 ENCOUNTER — Ambulatory Visit (INDEPENDENT_AMBULATORY_CARE_PROVIDER_SITE_OTHER): Payer: BLUE CROSS/BLUE SHIELD | Admitting: Family Medicine

## 2019-02-10 DIAGNOSIS — F419 Anxiety disorder, unspecified: Secondary | ICD-10-CM

## 2019-02-10 DIAGNOSIS — T148XXA Other injury of unspecified body region, initial encounter: Secondary | ICD-10-CM

## 2019-02-10 DIAGNOSIS — X503XXA Overexertion from repetitive movements, initial encounter: Secondary | ICD-10-CM

## 2019-02-10 DIAGNOSIS — R1013 Epigastric pain: Secondary | ICD-10-CM | POA: Diagnosis not present

## 2019-02-10 DIAGNOSIS — K209 Esophagitis, unspecified without bleeding: Secondary | ICD-10-CM

## 2019-02-10 MED ORDER — ATORVASTATIN CALCIUM 40 MG PO TABS: 40.0000 mg | ORAL_TABLET | Freq: Every day | ORAL | 3 refills | Status: DC

## 2019-02-10 MED ORDER — LANSOPRAZOLE 30 MG PO CPDR: 30.0000 mg | DELAYED_RELEASE_CAPSULE | Freq: Two times a day (BID) | ORAL | 3 refills | Status: DC

## 2019-02-10 MED ORDER — CYCLOBENZAPRINE HCL 10 MG PO TABS: 10.0000 mg | ORAL_TABLET | Freq: Two times a day (BID) | ORAL | 1 refills | Status: DC | PRN

## 2019-02-10 NOTE — Progress Notes (Signed)
Virtual Visit via Video Note  I connected with pt on 02/10/19 at 10:00 AM EDT by a video enabled telemedicine application and verified that I am speaking with the correct person using two identifiers.  Location patient: home Location provider:work or home office Persons participating in the virtual visit: patient, provider  I discussed the limitations of evaluation and management by telemedicine and the availability of in person appointments. The patient expressed understanding and agreed to proceed.  Telemedicine visit is a necessity given the COVID-19 restrictions in place at the current time.  HPI: 63 y/o WM being seen for 2 week f/u atypical chest discomfort and acute stress. Last visit EKG was normal and I felt like his pain was a combo of overuse muscle pain and GERD/dyspepsia. I added carafate to his prevacid 30mg  bid. I also started Alpraz 0.5, 1-2 bid prn for his acute anxiety, which may have been contributing to his physical sx's as well.  Interim hx: Says his gas has nearly completely resolved. Still with some pain in L scapular region but this is also much better, as is the pain in L lateral pectoral area where it meets the axillary region.  Has not tried flexeril yet.  Taking carafate and prevacid as instructed. Eating fine.  No CP, chest pressure, chest tightness, SOB, diaphoresis, nausea, or palpitations or dizziness. Xanax has helped with his anxiety.  It makes him feel ired some.  He is taking 1/4-1/2 tab tid or so. Sprained ankle recently, took 2 ibup and no help for this or for scapula/pect pain so he hasn't taken anymore.  ROS: See pertinent positives and negatives per HPI.  Past Medical History:  Diagnosis Date  . Barrett's esophagus    Bx+ 2007, 2009, and 2020.  Marland Kitchen Chronic asthma    Patient denies  . Chronic constipation   . Contact dermatitis and other eczema, due to unspecified cause    +scarring  . Epididymal cyst 06/2018   Bilat: small on R, but 4 cm on  left  Asymptomatic.  Marland Kitchen GERD (gastroesophageal reflux disease)   . History of adenomatous polyp of colon 08/10/2006   2007 and 12/2018; recall 12/2019.  Marland Kitchen Hyperlipidemia   . Left foot pain    ? Arch strain.  Some numbness to suggest possible tarsal tunnel syndrome vs lumbar radiculitis (sports med MD).  Responded very well to custom orthotic.  . Migraine syndrome    verapamil prophylaxis  . Seasonal and perennial allergic rhinitis    vaccine/injection therapy helpful  . Small bowel obstruction ([place]) Fall of 2019   surgery->lysis of adhesions    Past Surgical History:  Procedure Laterality Date  . ABDOMINAL EXPLORATION SURGERY  Fall 2019   SBO->lysis of adhesions  . COLONOSCOPY W/ POLYPECTOMY  08/10/06; 12/2018   Several adenomatous polyps 2007 and 2020; recall 12/2019  . ESOPHAGOGASTRODUODENOSCOPY  2007 and 07/20/08;12/2018   GERD, BARRET's esoph (Dr. Henrene Pastor). Same 12/2018.  Marland Kitchen LE venous doppler u/s  04/19/15   L leg NORMAL  . right knee arthroscopy  1980s   Torn meniscus (Dr. Noemi Chapel)    Family History  Problem Relation Age of Onset  . Other Father        chemical PNA siphoning gas  . Colon cancer Neg Hx   . Esophageal cancer Neg Hx   . Rectal cancer Neg Hx   . Stomach cancer Neg Hx      Current Outpatient Medications:  .  ALPRAZolam (XANAX) 0.5 MG tablet, 1-2 tabs po bid  prn anxiety, Disp: 30 tablet, Rfl: 0 .  atorvastatin (LIPITOR) 40 MG tablet, TAKE 1 TABLET BY MOUTH EVERY DAY, Disp: 30 tablet, Rfl: 5 .  bisacodyl (DULCOLAX) 5 MG EC tablet, Take 5 mg by mouth daily., Disp: , Rfl:  .  fluticasone (FLONASE) 50 MCG/ACT nasal spray, Place 2 sprays into both nostrils daily., Disp: 16 g, Rfl: 11 .  Ibuprofen (ADVIL) 200 MG CAPS, Take by mouth.  , Disp: , Rfl:  .  lansoprazole (PREVACID) 30 MG capsule, TAKE 1 CAPSULE (30 MG TOTAL) BY MOUTH 2 (TWO) TIMES DAILY., Disp: 60 capsule, Rfl: 6 .  polyethylene glycol (MIRALAX / GLYCOLAX) 17 g packet, Take 17 g by mouth daily., Disp: , Rfl:  .   psyllium (REGULOID) 0.52 g capsule, Take 0.52 g by mouth daily. Patient takes 4 Tablespoons daily., Disp: , Rfl:  .  sucralfate (CARAFATE) 1 g tablet, Take 1 tablet (1 g total) by mouth 4 (four) times daily -  with meals and at bedtime., Disp: 30 tablet, Rfl: 1 .  cyclobenzaprine (FLEXERIL) 10 MG tablet, Take 10 mg by mouth 2 (two) times daily as needed., Disp: , Rfl: 1 .  fexofenadine (ALLEGRA) 180 MG tablet, Take 1 tablet (180 mg total) by mouth daily. (Patient not taking: Reported on 01/26/2019), Disp: 30 tablet, Rfl: 11 .  verapamil (CALAN) 120 MG tablet, Take 1 tablet by mouth daily. , Disp: , Rfl: 2  EXAM:  VITALS per patient if applicable: There were no vitals taken for this visit.   GENERAL: alert, oriented, appears well and in no acute distress  HEENT: atraumatic, conjunttiva clear, no obvious abnormalities on inspection of external nose and ears  NECK: normal movements of the head and neck  LUNGS: on inspection no signs of respiratory distress, breathing rate appears normal, no obvious gross SOB, gasping or wheezing  CV: no obvious cyanosis  MS: moves all visible extremities without noticeable abnormality  PSYCH/NEURO: pleasant and cooperative, no obvious depression or anxiety, speech and thought processing grossly intact  LABS: none today    Chemistry      Component Value Date/Time   NA 139 06/23/2018 0839   K 5.3 (H) 06/23/2018 0839   CL 103 06/23/2018 0839   CO2 31 06/23/2018 0839   BUN 10 06/23/2018 0839   CREATININE 0.82 06/23/2018 0839      Component Value Date/Time   CALCIUM 10.0 06/23/2018 0839   ALKPHOS 68 06/23/2018 0839   AST 21 06/23/2018 0839   ALT 25 06/23/2018 0839   BILITOT 0.7 06/23/2018 0839     Lab Results  Component Value Date   WBC 5.7 06/23/2018   HGB 15.7 06/23/2018   HCT 46.1 06/23/2018   MCV 98.1 06/23/2018   PLT 441.0 (H) 06/23/2018    ASSESSMENT AND PLAN:  Discussed the following assessment and plan:  1)  Esophagitis/dyspepsia: improved significantly. Ween off carafate, continue prevacid 30mg  bid as he has been long term.  2) Musculoskeletal overuse pains in L pectoral/axillary area and L scap region: continue to try stretching, heat, relative rest, and he'll try flexeril, which he says has helped him with similar pain in the past. Avoid NSAIDs b/c assoc with GI upset.  3) Anxiety: using alprazolam appropriately and getting intended therapeutic effect with some mild drowsiness.  Continue to use prn.  No new rx for this med was given today. If he is requiring repeat rx for this then we'll have him sign CSC and discuss UDS.    I  discussed the assessment and treatment plan with the patient. The patient was provided an opportunity to ask questions and all were answered. The patient agreed with the plan and demonstrated an understanding of the instructions.   The patient was advised to call back or seek an in-person evaluation if the symptoms worsen or if the condition fails to improve as anticipated.  F/u: keep appt scheduled for 06/29/19.  Signed:  Crissie Sickles, MD           02/10/2019

## 2019-02-17 ENCOUNTER — Other Ambulatory Visit: Payer: Self-pay | Admitting: Family Medicine

## 2019-02-17 NOTE — Telephone Encounter (Signed)
RF request for xanax  LOV: 02/10/2019 Next ov: 06/29/2019 Last written: 01/27/2019  Asking for refill one week early. Please advise,

## 2019-02-18 ENCOUNTER — Telehealth: Payer: Self-pay | Admitting: Gastroenterology

## 2019-02-18 NOTE — Telephone Encounter (Signed)
[name] this afternoon. He is describing significant abdominal pain.  This abdominal pain is in his entire abdomen is reminiscent of his recent small bowel obstruction that he had within the last year.  This was taken care of at [facility] where he had a small bowel resection.  This was felt to be adhesive disease per documentation from Dr. Henrene Pastor.  He has chronic constipation but is actually on a good regimen of medications that usually allow him to have a bowel movement at least once daily.  His last bowel movement was yesterday in the morning.  He is currently not passing gas from below.  He is having increased burping he saw his PCP earlier this month and was put on Carafate in effort of trying to help with the burping.  This is helped somewhat. He took multiple doses of Dulcolax as well as MiraLAX and has not had a bowel movement.  I told the patient that it may be reasonable based on how he is doing and if he feels that he can tolerate things as he is not having nausea or vomiting currently that he could take magnesium citrate in an effort of trying to try and move his bowels. He does understand however that by giving laxatives if he did have an obstruction that we may make pain worse and make him feel unwell.  He understands this risk.  He does not feel comfortable coming to the emergency department at this point in time but will if things persist or worsen. I will relay this message to Dr. Henrene Pastor as well so he will be able to follow that up. If the patient comes in for evaluation in the setting of progressive abdominal pain he should be considered a possible candidate for the role of a cross-sectional CT because of his prior history of small bowel obstruction.  GI would be happy to see the patient if deemed necessary from a medical standpoint and if no surgical needs are found. The patient is appreciative for the call back and discussion this afternoon.   Jacob Britain,  MD [facility] Office # [phone]

## 2019-02-19 DIAGNOSIS — K56609 Unspecified intestinal obstruction, unspecified as to partial versus complete obstruction: Secondary | ICD-10-CM | POA: Diagnosis not present

## 2019-02-19 DIAGNOSIS — K7689 Other specified diseases of liver: Secondary | ICD-10-CM | POA: Diagnosis not present

## 2019-02-19 DIAGNOSIS — Z86010 Personal history of colonic polyps: Secondary | ICD-10-CM | POA: Diagnosis not present

## 2019-02-19 DIAGNOSIS — K56600 Partial intestinal obstruction, unspecified as to cause: Secondary | ICD-10-CM | POA: Diagnosis not present

## 2019-02-19 DIAGNOSIS — K598 Other specified functional intestinal disorders: Secondary | ICD-10-CM | POA: Diagnosis not present

## 2019-02-19 DIAGNOSIS — R112 Nausea with vomiting, unspecified: Secondary | ICD-10-CM | POA: Diagnosis not present

## 2019-02-19 DIAGNOSIS — K5730 Diverticulosis of large intestine without perforation or abscess without bleeding: Secondary | ICD-10-CM | POA: Diagnosis not present

## 2019-02-19 DIAGNOSIS — K529 Noninfective gastroenteritis and colitis, unspecified: Secondary | ICD-10-CM | POA: Diagnosis not present

## 2019-02-19 DIAGNOSIS — G43909 Migraine, unspecified, not intractable, without status migrainosus: Secondary | ICD-10-CM | POA: Diagnosis not present

## 2019-02-19 DIAGNOSIS — K219 Gastro-esophageal reflux disease without esophagitis: Secondary | ICD-10-CM | POA: Diagnosis not present

## 2019-02-19 DIAGNOSIS — R1084 Generalized abdominal pain: Secondary | ICD-10-CM | POA: Diagnosis not present

## 2019-02-19 DIAGNOSIS — K5651 Intestinal adhesions [bands], with partial obstruction: Secondary | ICD-10-CM | POA: Diagnosis not present

## 2019-02-19 DIAGNOSIS — R109 Unspecified abdominal pain: Secondary | ICD-10-CM | POA: Diagnosis not present

## 2019-02-19 DIAGNOSIS — Z79899 Other long term (current) drug therapy: Secondary | ICD-10-CM | POA: Diagnosis not present

## 2019-02-19 NOTE — Telephone Encounter (Signed)
Noted. Thanks.

## 2019-02-20 DIAGNOSIS — R112 Nausea with vomiting, unspecified: Secondary | ICD-10-CM | POA: Diagnosis not present

## 2019-02-20 DIAGNOSIS — K56609 Unspecified intestinal obstruction, unspecified as to partial versus complete obstruction: Secondary | ICD-10-CM | POA: Diagnosis not present

## 2019-02-21 DIAGNOSIS — K56609 Unspecified intestinal obstruction, unspecified as to partial versus complete obstruction: Secondary | ICD-10-CM | POA: Diagnosis not present

## 2019-02-22 MED ORDER — SODIUM CHLORIDE 0.9 % IV SOLN
10.00 | INTRAVENOUS | Status: DC
Start: ? — End: 2019-02-22

## 2019-02-22 MED ORDER — ALPRAZOLAM 1 MG PO TABS
1.00 | ORAL_TABLET | ORAL | Status: DC
Start: ? — End: 2019-02-22

## 2019-02-22 MED ORDER — ENOXAPARIN SODIUM 40 MG/0.4ML ~~LOC~~ SOLN
40.00 | SUBCUTANEOUS | Status: DC
Start: 2019-02-22 — End: 2019-02-22

## 2019-02-22 MED ORDER — MORPHINE SULFATE (PF) 4 MG/ML IV SOLN
4.00 | INTRAVENOUS | Status: DC
Start: ? — End: 2019-02-22

## 2019-02-22 MED ORDER — LABETALOL HCL 5 MG/ML IV SOLN
10.00 | INTRAVENOUS | Status: DC
Start: ? — End: 2019-02-22

## 2019-02-22 MED ORDER — GENERIC EXTERNAL MEDICATION
Status: DC
Start: ? — End: 2019-02-22

## 2019-02-22 MED ORDER — GENERIC EXTERNAL MEDICATION
1.00 | Status: DC
Start: 2019-02-22 — End: 2019-02-22

## 2019-02-22 MED ORDER — CYCLOBENZAPRINE HCL 10 MG PO TABS
10.00 | ORAL_TABLET | ORAL | Status: DC
Start: ? — End: 2019-02-22

## 2019-02-22 MED ORDER — MORPHINE SULFATE (PF) 4 MG/ML IV SOLN
2.00 | INTRAVENOUS | Status: DC
Start: ? — End: 2019-02-22

## 2019-02-22 MED ORDER — ACETAMINOPHEN 325 MG PO TABS
650.00 | ORAL_TABLET | ORAL | Status: DC
Start: ? — End: 2019-02-22

## 2019-02-22 MED ORDER — METRONIDAZOLE IN NACL 5-0.79 MG/ML-% IV SOLN
500.00 | INTRAVENOUS | Status: DC
Start: 2019-02-21 — End: 2019-02-22

## 2019-02-22 MED ORDER — KCL IN DEXTROSE-NACL 20-5-0.9 MEQ/L-%-% IV SOLN
100.00 | INTRAVENOUS | Status: DC
Start: ? — End: 2019-02-22

## 2019-02-22 MED ORDER — BISACODYL 10 MG RE SUPP
10.00 | RECTAL | Status: DC
Start: ? — End: 2019-02-22

## 2019-03-06 DIAGNOSIS — Z8719 Personal history of other diseases of the digestive system: Secondary | ICD-10-CM | POA: Diagnosis not present

## 2019-03-10 DIAGNOSIS — K56609 Unspecified intestinal obstruction, unspecified as to partial versus complete obstruction: Secondary | ICD-10-CM | POA: Diagnosis not present

## 2019-03-10 DIAGNOSIS — Z8719 Personal history of other diseases of the digestive system: Secondary | ICD-10-CM | POA: Diagnosis not present

## 2019-03-14 DIAGNOSIS — M9901 Segmental and somatic dysfunction of cervical region: Secondary | ICD-10-CM | POA: Diagnosis not present

## 2019-03-14 DIAGNOSIS — M50322 Other cervical disc degeneration at C5-C6 level: Secondary | ICD-10-CM | POA: Diagnosis not present

## 2019-03-14 DIAGNOSIS — M50323 Other cervical disc degeneration at C6-C7 level: Secondary | ICD-10-CM | POA: Diagnosis not present

## 2019-03-14 DIAGNOSIS — M50321 Other cervical disc degeneration at C4-C5 level: Secondary | ICD-10-CM | POA: Diagnosis not present

## 2019-03-21 ENCOUNTER — Other Ambulatory Visit: Payer: Self-pay | Admitting: Family Medicine

## 2019-03-22 NOTE — Telephone Encounter (Signed)
RF request for flexeril. Last OV 02/10/2019 Next OV 06/29/2019 Last RF 02/10/2019 # 30 x 1 RF.  Please advise.

## 2019-03-28 ENCOUNTER — Encounter: Payer: Self-pay | Admitting: Family Medicine

## 2019-03-28 ENCOUNTER — Other Ambulatory Visit: Payer: Self-pay

## 2019-03-28 ENCOUNTER — Ambulatory Visit (INDEPENDENT_AMBULATORY_CARE_PROVIDER_SITE_OTHER): Payer: BC Managed Care – PPO | Admitting: Family Medicine

## 2019-03-28 VITALS — BP 121/88 | HR 110 | Temp 97.6°F

## 2019-03-28 DIAGNOSIS — J069 Acute upper respiratory infection, unspecified: Secondary | ICD-10-CM | POA: Diagnosis not present

## 2019-03-28 NOTE — Progress Notes (Signed)
Virtual Visit via Video Note  I connected with pt on 03/28/19 at 10:30 AM EDT by a video enabled telemedicine application and verified that I am speaking with the correct person using two identifiers.  Location patient: home Location provider:work or home office Persons participating in the virtual visit: patient, provider  I discussed the limitations of evaluation and management by telemedicine and the availability of in person appointments. The patient expressed understanding and agreed to proceed.  Telemedicine visit is a necessity given the COVID-19 restrictions in place at the current time.  HPI: 63 y/o WM being seen today for "scratchy throat". Onset 4 days ago mild malasie, "felt like I was catching a cold".  SLight nasal congestion/runny nose.  No ear pain or popping.  Started getting hoarse. Did some yard work. No fevers.  No cough.  Throat is not sore but feels "scratchy".  Mild dull HA.  No SOB. No problems with taste or smell.   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 Taking flonase daily. Constipation still a problem. He had a SBO 02/2019 and this resolved with expectant management. ROS: See pertinent positives and negatives per HPI.  Past Medical History:  Diagnosis Date  . Anxiety    prn xanax started 01/2019  . Barrett's esophagus    Bx+ 2007, 2009, and 2020.  Marland Kitchen Chronic asthma    Patient denies  . Chronic constipation   . Contact dermatitis and other eczema, due to unspecified cause    +scarring  . Epididymal cyst 06/2018   Bilat: small on R, but 4 cm on left  Asymptomatic.  Marland Kitchen GERD (gastroesophageal reflux disease)   . History of adenomatous polyp of colon 08/10/2006   2007 and 12/2018; recall 12/2019.  Marland Kitchen Hyperlipidemia   . Left foot pain    ? Arch strain.  Some numbness to suggest possible tarsal tunnel syndrome vs lumbar radiculitis (sports med MD).  Responded very well to custom orthotic.  .  Migraine syndrome    verapamil prophylaxis  . Seasonal and perennial allergic rhinitis    vaccine/injection therapy helpful  . Small bowel obstruction ([place]) Fall of 2019   surgery->lysis of adhesions    Past Surgical History:  Procedure Laterality Date  . ABDOMINAL EXPLORATION SURGERY  Fall 2019   SBO->lysis of adhesions  . COLONOSCOPY W/ POLYPECTOMY  08/10/06; 12/2018   Several adenomatous polyps 2007 and 2020; recall 12/2019  . ESOPHAGOGASTRODUODENOSCOPY  2007 and 07/20/08;12/2018   GERD, BARRET's esoph (Dr. Henrene Pastor). Same 12/2018.  Marland Kitchen LE venous doppler u/s  04/19/15   L leg NORMAL  . right knee arthroscopy  1980s   Torn meniscus (Dr. Noemi Chapel)    Family History  Problem Relation Age of Onset  . Other Father        chemical PNA siphoning gas  . Colon cancer Neg Hx   . Esophageal cancer Neg Hx   . Rectal cancer Neg Hx   . Stomach cancer Neg Hx     SOCIAL HX:  Social History   Socioeconomic History  . Marital status: Married    Spouse name: Not on file  . Number of children: 1  . Years of education: Not on file  . Highest education level: Not on file  Occupational History  . Occupation: Designer, jewellery: Education administrator  Social Needs  . Financial resource strain: Not on file  .  Food insecurity    Worry: Not on file    Inability: Not on file  . Transportation needs    Medical: Not on file    Non-medical: Not on file  Tobacco Use  . Smoking status: Former Smoker    Packs/day: 1.00    Years: 10.00    Pack years: 10.00    Types: Cigarettes    Quit date: 07/06/1993    Years since quitting: 25.7  . Smokeless tobacco: Never Used  Substance and Sexual Activity  . Alcohol use: Yes    Comment: 1 or 2 daily  . Drug use: No  . Sexual activity: Not on file  Lifestyle  . Physical activity    Days per week: Not on file    Minutes per session: Not on file  . Stress: Not on file  Relationships  . Social Herbalist on phone:  Not on file    Gets together: Not on file    Attends religious service: Not on file    Active member of club or organization: Not on file    Attends meetings of clubs or organizations: Not on file    Relationship status: Not on file  Other Topics Concern  . Not on file  Social History Narrative   Married, one daughter.   Educ: [employer]   Occup: Biochemist, clinical for [employer].    No tobacco, 2 beers a night, no hx of problem drinking/drug use.   Caffeine: 2-3 cups coffee every morning.  Tea x 2 later in day.      Current Outpatient Medications:  .  ALPRAZolam (XANAX) 0.5 MG tablet, TAKE 1 TO 2 TABLETS BY MOUTH TWICE A DAY AS NEEDED FOR ANXIETY, Disp: 60 tablet, Rfl: 1 .  atorvastatin (LIPITOR) 40 MG tablet, Take 1 tablet (40 mg total) by mouth daily., Disp: 90 tablet, Rfl: 3 .  bisacodyl (DULCOLAX) 5 MG EC tablet, Take 5 mg by mouth daily., Disp: , Rfl:  .  cyclobenzaprine (FLEXERIL) 10 MG tablet, TAKE 1 TABLET (10 MG TOTAL) BY MOUTH 2 (TWO) TIMES DAILY AS NEEDED., Disp: 30 tablet, Rfl: 3 .  fluticasone (FLONASE) 50 MCG/ACT nasal spray, Place 2 sprays into both nostrils daily., Disp: 16 g, Rfl: 11 .  Ibuprofen (ADVIL) 200 MG CAPS, Take by mouth.  , Disp: , Rfl:  .  lansoprazole (PREVACID) 30 MG capsule, Take 1 capsule (30 mg total) by mouth 2 (two) times a day., Disp: 180 capsule, Rfl: 3 .  polyethylene glycol (MIRALAX / GLYCOLAX) 17 g packet, Take 17 g by mouth daily., Disp: , Rfl:  .  psyllium (REGULOID) 0.52 g capsule, Take 0.52 g by mouth daily. Patient takes 4 Tablespoons daily., Disp: , Rfl:  .  sucralfate (CARAFATE) 1 g tablet, Take 1 tablet (1 g total) by mouth 4 (four) times daily -  with meals and at bedtime., Disp: 30 tablet, Rfl: 1 .  verapamil (CALAN) 120 MG tablet, Take 1 tablet by mouth daily. , Disp: , Rfl: 2  EXAM:  VITALS per patient if applicable:  GENERAL: alert, oriented, appears well and in no acute distress  HEENT: atraumatic, conjunttiva clear, no obvious  abnormalities on inspection of external nose and ears  NECK: normal movements of the head and neck  LUNGS: on inspection no signs of respiratory distress, breathing rate appears normal, no obvious gross SOB, gasping or wheezing  CV: no obvious cyanosis  MS: moves all visible extremities without noticeable abnormality  PSYCH/NEURO:  pleasant and cooperative, no obvious depression or anxiety, speech and thought processing grossly intact  LABS: none today    Chemistry      Component Value Date/Time   NA 139 06/23/2018 0839   K 5.3 (H) 06/23/2018 0839   CL 103 06/23/2018 0839   CO2 31 06/23/2018 0839   BUN 10 06/23/2018 0839   CREATININE 0.82 06/23/2018 0839      Component Value Date/Time   CALCIUM 10.0 06/23/2018 0839   ALKPHOS 68 06/23/2018 0839   AST 21 06/23/2018 0839   ALT 25 06/23/2018 0839   BILITOT 0.7 06/23/2018 0839     Lab Results  Component Value Date   WBC 5.7 06/23/2018   HGB 15.7 06/23/2018   HCT 46.1 06/23/2018   MCV 98.1 06/23/2018   PLT 441.0 (H) 06/23/2018   No results found for: HGBA1C  ASSESSMENT AND PLAN:  Discussed the following assessment and plan:  Viral URI vs hay fever-->mild. Pt very scared of covid 19.  He has no known contacts with covid 19. I reassured him, no new meds recommended at this time. Signs/symptoms to call or return for were reviewed and pt expressed understanding.    I discussed the assessment and treatment plan with the patient. The patient was provided an opportunity to ask questions and all were answered. The patient agreed with the plan and demonstrated an understanding of the instructions.   The patient was advised to call back or seek an in-person evaluation if the symptoms worsen or if the condition fails to improve as anticipated.  F/u: if not improving in 5-6 days or if worsening prior to then.  Signed:  Crissie Sickles, MD           03/28/2019

## 2019-03-30 DIAGNOSIS — H2511 Age-related nuclear cataract, right eye: Secondary | ICD-10-CM | POA: Diagnosis not present

## 2019-03-30 DIAGNOSIS — H25041 Posterior subcapsular polar age-related cataract, right eye: Secondary | ICD-10-CM | POA: Diagnosis not present

## 2019-03-30 DIAGNOSIS — H43393 Other vitreous opacities, bilateral: Secondary | ICD-10-CM | POA: Diagnosis not present

## 2019-03-30 DIAGNOSIS — H25012 Cortical age-related cataract, left eye: Secondary | ICD-10-CM | POA: Diagnosis not present

## 2019-04-11 DIAGNOSIS — M50323 Other cervical disc degeneration at C6-C7 level: Secondary | ICD-10-CM | POA: Diagnosis not present

## 2019-04-11 DIAGNOSIS — M9901 Segmental and somatic dysfunction of cervical region: Secondary | ICD-10-CM | POA: Diagnosis not present

## 2019-04-11 DIAGNOSIS — M50322 Other cervical disc degeneration at C5-C6 level: Secondary | ICD-10-CM | POA: Diagnosis not present

## 2019-04-11 DIAGNOSIS — M50321 Other cervical disc degeneration at C4-C5 level: Secondary | ICD-10-CM | POA: Diagnosis not present

## 2019-04-12 IMAGING — DX DG FOOT COMPLETE 3+V*L*
3 series · 3 of 3 positions shown · non-contrast
Comparison: No recent .

CLINICAL DATA: Left foot pain.

EXAM:
LEFT FOOT - COMPLETE 3+ VIEW

[foot ap]
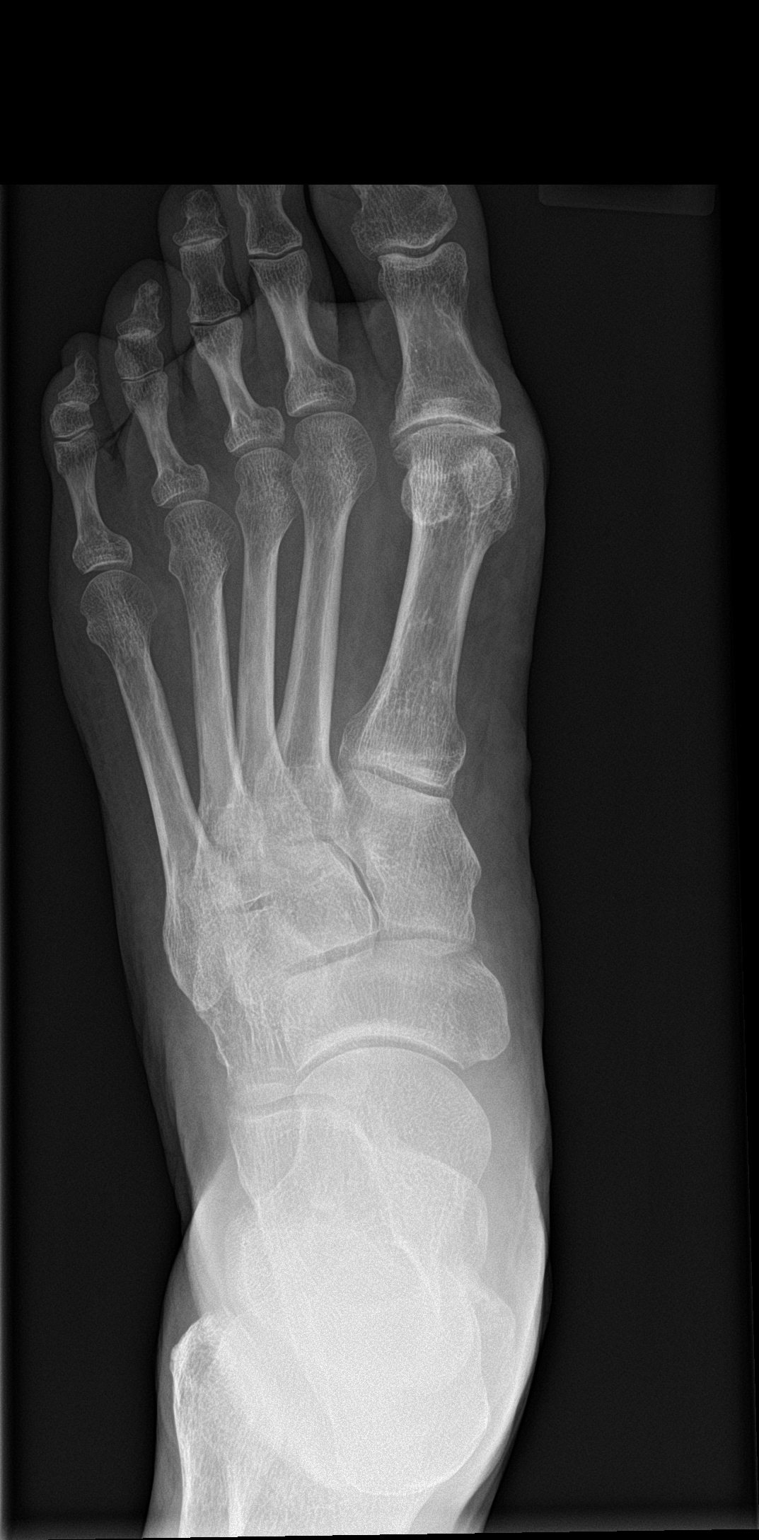

[foot obl]
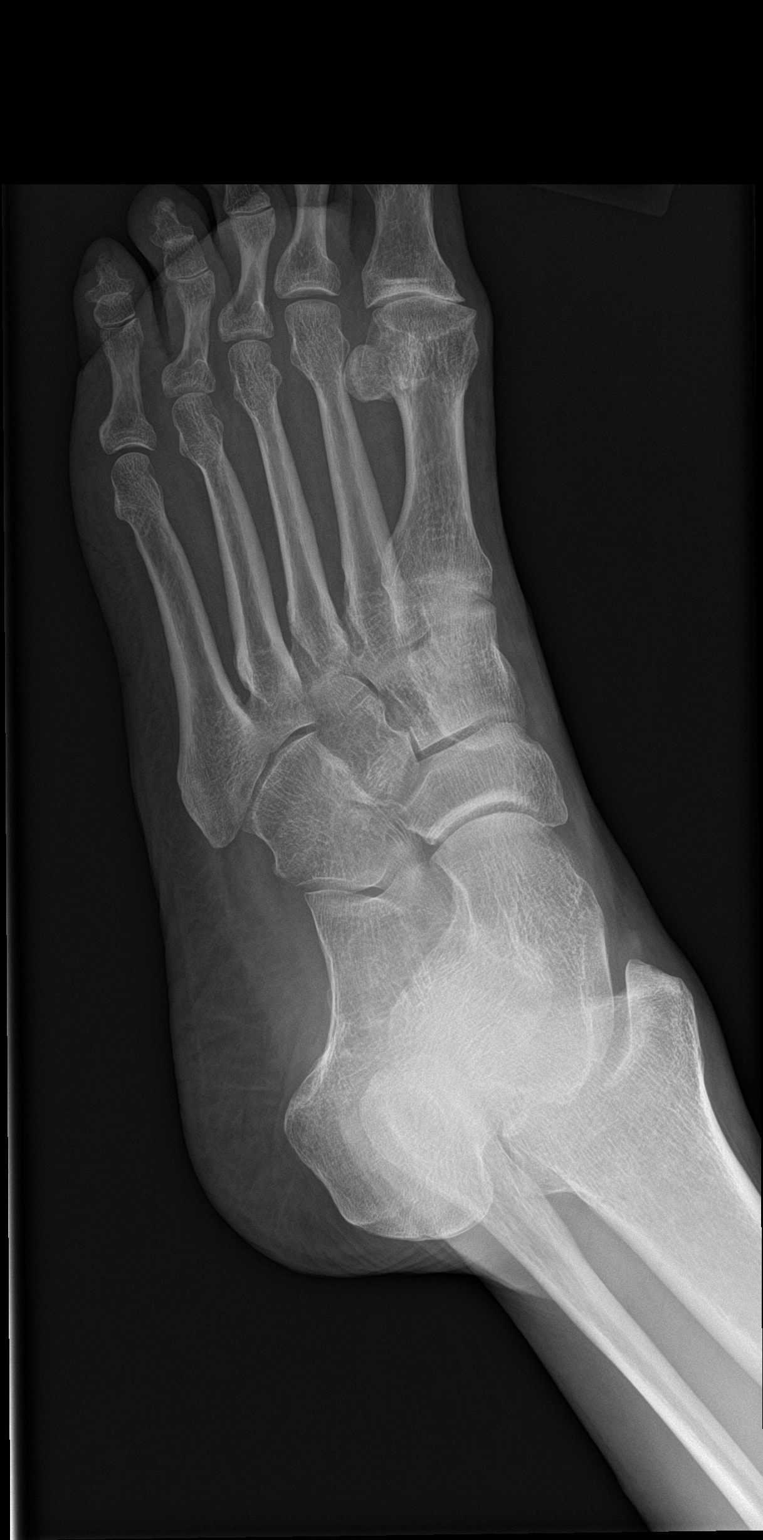

[foot lat]
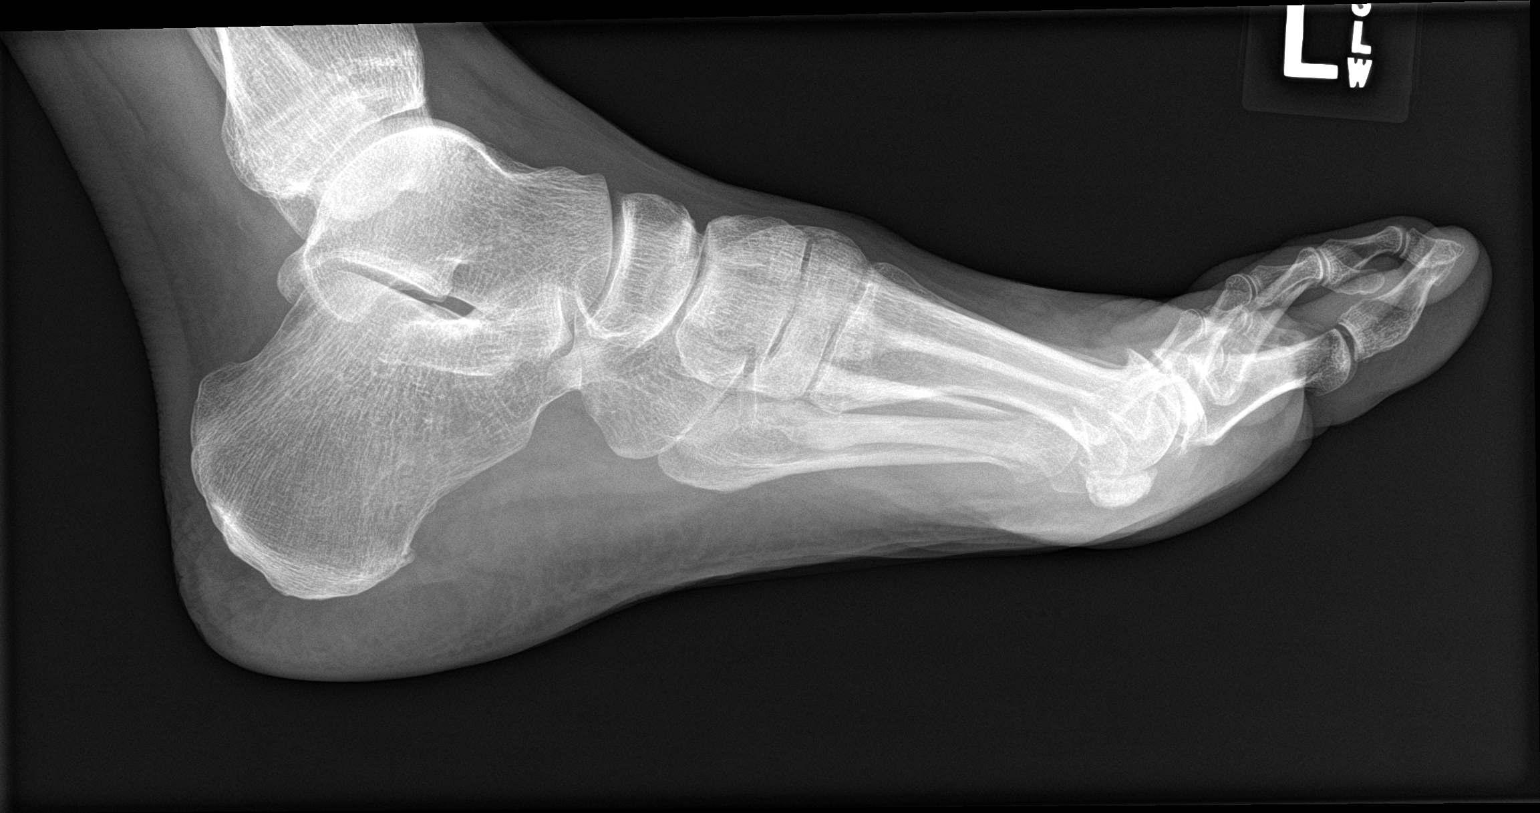

[3 of 3 positions shown; findings below may reference images not displayed]

FINDINGS: No acute bony or joint abnormality identified. No evidence of
fracture dislocation. Diffuse degenerative changes. Degenerative
changes most prominent about the first metatarsophalangeal joint.
IMPRESSION: No acute abnormality. Diffuse degenerative changes. Degenerative
changes most prior about the first metatarsophalangeal joint.

## 2019-04-25 ENCOUNTER — Telehealth: Payer: Self-pay | Admitting: Internal Medicine

## 2019-04-25 NOTE — Telephone Encounter (Signed)
Pt states he had a follow-up appt with [facility] in June from [facility]. Pt states they ordered a CT entero. He has not heard from the results, has called the surgeons office and has not been able to reach anyone. He is concerned and wanted Dr. Henrene Pastor to look at scan and see if anything needs to be done. He reports he is still having abdominal pain. Report placed in Dr. Blanch Media Inbox for review.

## 2019-04-25 NOTE — Telephone Encounter (Signed)
Pt aware. Pt scheduled to see Amy Esterwood PA 05/11/19@2pm . Pt aware of appt.

## 2019-04-25 NOTE — Telephone Encounter (Signed)
Pt reported that he had CT done at [facility] in [place] and inquired whether we have received records.  Pt would like to know how to proceed.

## 2019-04-25 NOTE — Telephone Encounter (Signed)
Let patient know that I have reviewed his CT scan report, though the ordering physician is ultimately responsible.  No small bowel obstruction.  A large amount of stool in his colon.  If he is having trouble with his bowels, then increase MiraLAX to have 1 or 2 bowel movements daily.  Also compression fractures in his back (not something I deal with, see his PCP if having back pain).  He is welcome to schedule a routine GI follow-up appointment in person next month if he is still having abdominal pain after achieving good regular bowel movements.

## 2019-04-27 ENCOUNTER — Telehealth: Payer: Self-pay | Admitting: *Deleted

## 2019-04-27 NOTE — Telephone Encounter (Signed)
Pt left vm this morning letting us know that he had an upper gi ct with [facility] on 03-20-19.  He said the doctor reported to him the incidental finding of two compound fractures in his spine.  Pt would like for you to look over those results.

## 2019-04-28 NOTE — Telephone Encounter (Signed)
Looks like there are a couple of old vertebral compression fractures. I would like him to see me at some point to discuss this.  Is he having a lot of pain in the back?  Any history of trauma in the past that could have led to this?

## 2019-04-28 NOTE — Telephone Encounter (Signed)
Called and left pt msg of Dr Mcneil Sober recommendations. Advised pt to call and schedule appt with Dr T to discuss

## 2019-05-06 DIAGNOSIS — M4850XA Collapsed vertebra, not elsewhere classified, site unspecified, initial encounter for fracture: Secondary | ICD-10-CM

## 2019-05-06 HISTORY — DX: Collapsed vertebra, not elsewhere classified, site unspecified, initial encounter for fracture: M48.50XA

## 2019-05-11 ENCOUNTER — Ambulatory Visit: Payer: BC Managed Care – PPO | Admitting: Gastroenterology

## 2019-05-11 ENCOUNTER — Encounter: Payer: Self-pay | Admitting: Gastroenterology

## 2019-05-11 VITALS — BP 130/70 | HR 80 | Temp 97.9°F | Ht 70.0 in | Wt 140.4 lb

## 2019-05-11 DIAGNOSIS — K5909 Other constipation: Secondary | ICD-10-CM | POA: Diagnosis not present

## 2019-05-11 NOTE — Progress Notes (Signed)
05/11/2019 Donevan Biller Tyndall [id number] 1956/03/20   HISTORY OF PRESENT ILLNESS: This is a 63 year old male who is a patient of Dr. Blanch Media.  He has history of small bowel obstruction related to adhesions.  In September 2019 had exploratory laparoscopy with lysis of adhesions.  He was admitted again in May of this year at [facility] for 3 days with partial small bowel obstruction.  He had a CT enterography in June of this year that showed the following:  1. Small bowel appears normal. No findings for obstruction. No findings for acute enteritis. 2. Large amount of stool throughout the colon. 3. Emphysema. 4. Compression fractures of T11 and T12 vertebral bodies. The compression fracture at T11 has progressed since prior study.  Had EGD and colonoscopy in March of this year that showed the following, respectively:  1. GERD 2. Diminutive Barrett's type mucosa status post biopsies (showed intestinal metaplasia on pathology). 3. Otherwise unremarkable exam.  - One 5 mm polyp in the transverse colon, removed with a cold snare. Resected and retrieved. - Three 5 to 15 mm polyps in the cecum, removed with a cold snare. Resected and retrieved.  Injected. - Diverticulosis in the left colon. - The examination was otherwise normal on direct and retroflexion views.  He comes in today with complaints primarily of constipation.  He tells me that it has been very severe, having to take MiraLAX 1-2 doses daily and Dulcolax 2 to 3 capsules daily.  He is moving his bowels as long as he takes something like that regimen.  But, then it this gives him loose stools and he is not able to be having diarrhea while he is trying to work.   Past Medical History:  Diagnosis Date  . Anxiety    prn xanax started 01/2019  . Barrett's esophagus    Bx+ 2007, 2009, and 2020.  Marland Kitchen Chronic asthma    Patient denies  . Chronic constipation   . Contact dermatitis and other eczema, due to unspecified cause    +scarring   . Epididymal cyst 06/2018   Bilat: small on R, but 4 cm on left  Asymptomatic.  Marland Kitchen GERD (gastroesophageal reflux disease)   . History of adenomatous polyp of colon 08/10/2006   2007 and 12/2018; recall 12/2019.  Marland Kitchen Hyperlipidemia   . Left foot pain    ? Arch strain.  Some numbness to suggest possible tarsal tunnel syndrome vs lumbar radiculitis (sports med MD).  Responded very well to custom orthotic.  . Migraine syndrome    verapamil prophylaxis  . Seasonal and perennial allergic rhinitis    vaccine/injection therapy helpful  . Small bowel obstruction ([place]) Fall of 2019   surgery->lysis of adhesions   Past Surgical History:  Procedure Laterality Date  . ABDOMINAL EXPLORATION SURGERY  Fall 2019   SBO->lysis of adhesions  . COLONOSCOPY W/ POLYPECTOMY  08/10/06; 12/2018   Several adenomatous polyps 2007 and 2020; recall 12/2019  . ESOPHAGOGASTRODUODENOSCOPY  2007 and 07/20/08;12/2018   GERD, BARRET's esoph (Dr. Henrene Pastor). Same 12/2018.  Marland Kitchen LE venous doppler u/s  04/19/15   L leg NORMAL  . right knee arthroscopy  1980s   Torn meniscus (Dr. Noemi Chapel)    reports that he quit smoking about 25 years ago. His smoking use included cigarettes. He has a 10.00 pack-year smoking history. He has never used smokeless tobacco. He reports current alcohol use. He reports that he does not use drugs. family history includes Other in his father. No Known Allergies  Outpatient Encounter Medications as of 05/11/2019  Medication Sig  . ALPRAZolam (XANAX) 0.5 MG tablet TAKE 1 TO 2 TABLETS BY MOUTH TWICE A DAY AS NEEDED FOR ANXIETY (Patient taking differently: TAKE 0.5 TABLETS BY MOUTH AT BEDTIME AS NEEDED FOR ANXIETY)  . atorvastatin (LIPITOR) 40 MG tablet Take 1 tablet (40 mg total) by mouth daily.  . bisacodyl (DULCOLAX) 5 MG EC tablet Take 10 mg by mouth at bedtime.   . cyclobenzaprine (FLEXERIL) 10 MG tablet TAKE 1 TABLET (10 MG TOTAL) BY MOUTH 2 (TWO) TIMES DAILY AS NEEDED.  . fluticasone (FLONASE) 50 MCG/ACT  nasal spray Place 2 sprays into both nostrils daily.  . Ibuprofen (ADVIL) 200 MG CAPS Take by mouth.    . lansoprazole (PREVACID) 30 MG capsule Take 1 capsule (30 mg total) by mouth 2 (two) times a day.  . polyethylene glycol (MIRALAX / GLYCOLAX) 17 g packet Take 17 g by mouth daily.  . [DISCONTINUED] psyllium (REGULOID) 0.52 g capsule Take 0.52 g by mouth daily. Patient takes 4 Tablespoons daily.  . [DISCONTINUED] verapamil (CALAN) 120 MG tablet Take 1 tablet by mouth daily.    No facility-administered encounter medications on file as of 05/11/2019.      REVIEW OF SYSTEMS  : All other systems reviewed and negative except where noted in the History of Present Illness.   PHYSICAL EXAM: BP 130/70   Pulse 80   Temp 97.9 F (36.6 C)   Ht 5\' 10"  (1.778 m)   Wt 140 lb 6.4 oz (63.7 kg)   BMI 20.15 kg/m  General: Well developed white male in no acute distress Head: Normocephalic and atraumatic Eyes:  Sclerae anicteric, conjunctiva pink. Ears: Normal auditory acuity Lungs: Clear throughout to auscultation; no W/R/R.  No increased WOB. Heart: Regular rate and rhythm; no M/R/G. Abdomen: Soft, non-distended.  BS present.  Small umbilical hernia noted, reducible.  Non-tender. Musculoskeletal: Symmetrical with no gross deformities  Skin: No lesions on visible extremities Extremities: No edema  Neurological: Alert oriented x 4, grossly non-focal Psychological:  Alert and cooperative. Normal mood and affect  ASSESSMENT AND PLAN: *Chronic constipation: Currently taking MiraLAX 1-2 doses daily and Dulcolax 2 to 3 capsules daily.  Need to try to discontinue the stimulant laxatives.  I am going to have him samples of Linzess 290 mcg daily to try.  He will call us back with an update in 1 to 2 weeks and if he would like to continue the medication we can send a prescription for that.  CC:  McGowen, Adrian Blackwater, MD

## 2019-05-11 NOTE — Patient Instructions (Signed)
We have given you Linzess samples to try, call back within 7-10 days with an update on how you are doing, ask for Rosanne Sack, RN [phone]  Thank You for choosing [facility]

## 2019-05-12 ENCOUNTER — Ambulatory Visit: Payer: BC Managed Care – PPO | Admitting: Sports Medicine

## 2019-05-12 ENCOUNTER — Ambulatory Visit (INDEPENDENT_AMBULATORY_CARE_PROVIDER_SITE_OTHER): Payer: BC Managed Care – PPO

## 2019-05-12 ENCOUNTER — Encounter: Payer: Self-pay | Admitting: Sports Medicine

## 2019-05-12 ENCOUNTER — Encounter: Payer: Self-pay | Admitting: Gastroenterology

## 2019-05-12 ENCOUNTER — Other Ambulatory Visit: Payer: Self-pay

## 2019-05-12 DIAGNOSIS — S22080A Wedge compression fracture of T11-T12 vertebra, initial encounter for closed fracture: Secondary | ICD-10-CM | POA: Diagnosis not present

## 2019-05-12 DIAGNOSIS — M8000XD Age-related osteoporosis with current pathological fracture, unspecified site, subsequent encounter for fracture with routine healing: Secondary | ICD-10-CM

## 2019-05-12 DIAGNOSIS — M47816 Spondylosis without myelopathy or radiculopathy, lumbar region: Secondary | ICD-10-CM

## 2019-05-12 DIAGNOSIS — K5909 Other constipation: Secondary | ICD-10-CM | POA: Insufficient documentation

## 2019-05-12 DIAGNOSIS — S22000A Wedge compression fracture of unspecified thoracic vertebra, initial encounter for closed fracture: Secondary | ICD-10-CM | POA: Insufficient documentation

## 2019-05-12 DIAGNOSIS — M8588 Other specified disorders of bone density and structure, other site: Secondary | ICD-10-CM | POA: Diagnosis not present

## 2019-05-12 DIAGNOSIS — M545 Low back pain: Secondary | ICD-10-CM | POA: Diagnosis not present

## 2019-05-12 DIAGNOSIS — S22000D Wedge compression fracture of unspecified thoracic vertebra, subsequent encounter for fracture with routine healing: Secondary | ICD-10-CM

## 2019-05-12 DIAGNOSIS — M4854XA Collapsed vertebra, not elsewhere classified, thoracic region, initial encounter for fracture: Secondary | ICD-10-CM | POA: Diagnosis not present

## 2019-05-12 MED ORDER — DEXAMETHASONE 4 MG PO TABS: 4.0000 mg | ORAL_TABLET | Freq: Three times a day (TID) | ORAL | 0 refills | Status: DC

## 2019-05-12 NOTE — Assessment & Plan Note (Signed)
This is fairly uncommon in an adult male. Adding a bone density test, repeat x-rays.

## 2019-05-12 NOTE — Assessment & Plan Note (Signed)
Decadron, patient does not tolerate prednisone. Lumbar spine x-rays, Home rehabilitation exercises, he declined formal physical therapy. Return to see me in 4 to 6 weeks, MRI for interventional planning if no better, pain is axial and facetogenic.

## 2019-05-12 NOTE — Progress Notes (Addendum)
Subjective:    CC: Low back pain  HPI: Jacob Blanchard is a very pleasant 63 year old male, he had a exploratory laparoscopy for a bowel obstruction, incidentally noted on a subsequent CT enterography was a T11 and T12 vertebral compression fracture, with progression of the T11 compression fracture.  He has only a bit of pain up in the thoracic region, the majority of his pain is in the lower lumbar spine, worse when pushing his lawnmower, and worse with standing up straight.  Not worse with Valsalva, no bowel or bladder dysfunction that is new, no constitutional symptoms.  Nothing radicular.  I reviewed the past medical history, family history, social history, surgical history, and allergies today and no changes were needed.  Please see the problem list section below in epic for further details.  Past Medical History: Past Medical History:  Diagnosis Date  . Anxiety    prn xanax started 01/2019  . Barrett's esophagus    Bx+ 2007, 2009, and 2020.  Marland Kitchen Chronic asthma    Patient denies  . Chronic constipation   . Contact dermatitis and other eczema, due to unspecified cause    +scarring  . Epididymal cyst 06/2018   Bilat: small on R, but 4 cm on left  Asymptomatic.  Marland Kitchen GERD (gastroesophageal reflux disease)   . History of adenomatous polyp of colon 08/10/2006   2007 and 12/2018; recall 12/2019.  Marland Kitchen Hyperlipidemia   . Left foot pain    ? Arch strain.  Some numbness to suggest possible tarsal tunnel syndrome vs lumbar radiculitis (sports med MD).  Responded very well to custom orthotic.  . Migraine syndrome    verapamil prophylaxis  . Seasonal and perennial allergic rhinitis    vaccine/injection therapy helpful  . Small bowel obstruction ([place]) Fall of 2019   surgery->lysis of adhesions   Past Surgical History: Past Surgical History:  Procedure Laterality Date  . ABDOMINAL EXPLORATION SURGERY  Fall 2019   SBO->lysis of adhesions  . COLONOSCOPY W/ POLYPECTOMY  08/10/06; 12/2018   Several  adenomatous polyps 2007 and 2020; recall 12/2019  . ESOPHAGOGASTRODUODENOSCOPY  2007 and 07/20/08;12/2018   GERD, BARRET's esoph (Dr. Henrene Pastor). Same 12/2018.  Marland Kitchen LE venous doppler u/s  04/19/15   L leg NORMAL  . right knee arthroscopy  1980s   Torn meniscus (Dr. Noemi Chapel)   Social History: Social History   Socioeconomic History  . Marital status: Married    Spouse name: Not on file  . Number of children: 1  . Years of education: Not on file  . Highest education level: Not on file  Occupational History  . Occupation: Designer, jewellery: Education administrator  Social Needs  . Financial resource strain: Not on file  . Food insecurity    Worry: Not on file    Inability: Not on file  . Transportation needs    Medical: Not on file    Non-medical: Not on file  Tobacco Use  . Smoking status: Former Smoker    Packs/day: 1.00    Years: 10.00    Pack years: 10.00    Types: Cigarettes    Quit date: 07/06/1993    Years since quitting: 25.8  . Smokeless tobacco: Never Used  Substance and Sexual Activity  . Alcohol use: Yes    Comment: 1 or 2 daily  . Drug use: No  . Sexual activity: Not on file  Lifestyle  . Physical activity    Days per week: Not on file  Minutes per session: Not on file  . Stress: Not on file  Relationships  . Social Herbalist on phone: Not on file    Gets together: Not on file    Attends religious service: Not on file    Active member of club or organization: Not on file    Attends meetings of clubs or organizations: Not on file    Relationship status: Not on file  Other Topics Concern  . Not on file  Social History Narrative   Married, one daughter.   Educ: [employer]   Occup: Biochemist, clinical for [employer].    No tobacco, 2 beers a night, no hx of problem drinking/drug use.   Caffeine: 2-3 cups coffee every morning.  Tea x 2 later in day.   Family History: Family History  Problem Relation Age of Onset  . Other  Father        chemical PNA siphoning gas  . Colon cancer Neg Hx   . Esophageal cancer Neg Hx   . Rectal cancer Neg Hx   . Stomach cancer Neg Hx    Allergies: No Known Allergies Medications: See med rec.  Review of Systems: No fevers, chills, night sweats, weight loss, chest pain, or shortness of breath.   Objective:    General: Well Developed, well nourished, and in no acute distress.  Neuro: Alert and oriented x3, extra-ocular muscles intact, sensation grossly intact.  HEENT: Normocephalic, atraumatic, pupils equal round reactive to light, neck supple, no masses, no lymphadenopathy, thyroid nonpalpable.  Skin: Warm and dry, no rashes. Cardiac: Regular rate and rhythm, no murmurs rubs or gallops, no lower extremity edema.  Respiratory: Clear to auscultation bilaterally. Not using accessory muscles, speaking in full sentences. Back Exam:  Inspection: Unremarkable  Motion: Flexion 45 deg, Extension 45 deg, Side Bending to 45 deg bilaterally,  Rotation to 45 deg bilaterally  SLR laying: Negative  XSLR laying: Negative  Palpable tenderness: None. FABER: negative. Sensory change: Gross sensation intact to all lumbar and sacral dermatomes.  Reflexes: 2+ at both patellar tendons, 2+ at achilles tendons, Babinski's downgoing.  Strength at foot  Plantar-flexion: 5/5 Dorsi-flexion: 5/5 Eversion: 5/5 Inversion: 5/5  Leg strength  Quad: 5/5 Hamstring: 5/5 Hip flexor: 5/5 Hip abductors: 5/5  Gait unremarkable.  Impression and Recommendations:    Thoracic compression fracture [facility]) This is fairly uncommon in an adult male. Adding a bone density test, repeat x-rays.   Lumbar spondylosis Decadron, patient does not tolerate prednisone. Lumbar spine x-rays, Home rehabilitation exercises, he declined formal physical therapy. Return to see me in 4 to 6 weeks, MRI for interventional planning if no better, pain is axial and facetogenic.  Osteoporosis Bone density test shows a T score of  -2.4, although this is on the borderline of osteopenia it is so close to osteoporosis that I think we will treated as such especially considering his vertebral compression fracture.  If it is okay with him I will add Fosamax.   ___________________________________________ Gwen Her. Dianah Field, M.D., ABFM., CAQSM. Primary Care and Sports Medicine [facility]  Adjunct Professor of [place] [employer] [facility] [employer]

## 2019-05-18 DIAGNOSIS — M50322 Other cervical disc degeneration at C5-C6 level: Secondary | ICD-10-CM | POA: Diagnosis not present

## 2019-05-18 DIAGNOSIS — M9901 Segmental and somatic dysfunction of cervical region: Secondary | ICD-10-CM | POA: Diagnosis not present

## 2019-05-18 DIAGNOSIS — M50323 Other cervical disc degeneration at C6-C7 level: Secondary | ICD-10-CM | POA: Diagnosis not present

## 2019-05-18 DIAGNOSIS — M50321 Other cervical disc degeneration at C4-C5 level: Secondary | ICD-10-CM | POA: Diagnosis not present

## 2019-05-19 ENCOUNTER — Telehealth: Payer: Self-pay | Admitting: Gastroenterology

## 2019-05-19 MED ORDER — LINACLOTIDE 290 MCG PO CAPS: 290.0000 ug | ORAL_CAPSULE | Freq: Every day | ORAL | 3 refills | Status: DC

## 2019-05-19 NOTE — Telephone Encounter (Signed)
Refill for linzess sent to pharmacy.

## 2019-05-21 NOTE — Progress Notes (Signed)
Noted  

## 2019-05-24 ENCOUNTER — Other Ambulatory Visit: Payer: Self-pay

## 2019-05-24 ENCOUNTER — Ambulatory Visit (INDEPENDENT_AMBULATORY_CARE_PROVIDER_SITE_OTHER): Payer: BC Managed Care – PPO

## 2019-05-24 DIAGNOSIS — S22000D Wedge compression fracture of unspecified thoracic vertebra, subsequent encounter for fracture with routine healing: Secondary | ICD-10-CM | POA: Diagnosis not present

## 2019-05-24 DIAGNOSIS — M8589 Other specified disorders of bone density and structure, multiple sites: Secondary | ICD-10-CM | POA: Diagnosis not present

## 2019-05-24 DIAGNOSIS — M810 Age-related osteoporosis without current pathological fracture: Secondary | ICD-10-CM | POA: Insufficient documentation

## 2019-05-24 HISTORY — PX: OTHER SURGICAL HISTORY: SHX169

## 2019-05-24 NOTE — Assessment & Plan Note (Signed)
Bone density test shows a T score of -2.4, although this is on the borderline of osteopenia it is so close to osteoporosis that I think we will treated as such especially considering his vertebral compression fracture.  If it is okay with him I will add Fosamax.

## 2019-05-30 ENCOUNTER — Other Ambulatory Visit: Payer: Self-pay | Admitting: *Deleted

## 2019-05-30 MED ORDER — ALENDRONATE SODIUM 70 MG PO TABS: 70.0000 mg | ORAL_TABLET | ORAL | 11 refills | Status: DC

## 2019-05-30 NOTE — Addendum Note (Signed)
Addended by: Silverio Decamp on: 05/30/2019 10:01 AM   Modules accepted: Orders

## 2019-06-04 ENCOUNTER — Encounter: Payer: Self-pay | Admitting: Family Medicine

## 2019-06-08 DIAGNOSIS — M9901 Segmental and somatic dysfunction of cervical region: Secondary | ICD-10-CM | POA: Diagnosis not present

## 2019-06-08 DIAGNOSIS — M50321 Other cervical disc degeneration at C4-C5 level: Secondary | ICD-10-CM | POA: Diagnosis not present

## 2019-06-08 DIAGNOSIS — M50322 Other cervical disc degeneration at C5-C6 level: Secondary | ICD-10-CM | POA: Diagnosis not present

## 2019-06-08 DIAGNOSIS — M50323 Other cervical disc degeneration at C6-C7 level: Secondary | ICD-10-CM | POA: Diagnosis not present

## 2019-06-19 DIAGNOSIS — G44009 Cluster headache syndrome, unspecified, not intractable: Secondary | ICD-10-CM | POA: Diagnosis not present

## 2019-06-23 ENCOUNTER — Other Ambulatory Visit: Payer: Self-pay

## 2019-06-23 ENCOUNTER — Encounter: Payer: Self-pay | Admitting: Sports Medicine

## 2019-06-23 ENCOUNTER — Ambulatory Visit: Payer: BC Managed Care – PPO | Admitting: Sports Medicine

## 2019-06-23 DIAGNOSIS — M8000XD Age-related osteoporosis with current pathological fracture, unspecified site, subsequent encounter for fracture with routine healing: Secondary | ICD-10-CM

## 2019-06-23 DIAGNOSIS — M47816 Spondylosis without myelopathy or radiculopathy, lumbar region: Secondary | ICD-10-CM | POA: Diagnosis not present

## 2019-06-23 DIAGNOSIS — S22000D Wedge compression fracture of unspecified thoracic vertebra, subsequent encounter for fracture with routine healing: Secondary | ICD-10-CM

## 2019-06-23 MED ORDER — CALCIUM CARBONATE-VITAMIN D 600-400 MG-UNIT PO TABS: 1.0000 | ORAL_TABLET | Freq: Two times a day (BID) | ORAL | 11 refills | Status: DC

## 2019-06-23 NOTE — Progress Notes (Signed)
Subjective:    CC: Follow-up  HPI: Jacob Blanchard is a pleasant 63 year old male, he was found to have thoracic compression fractures after minor trauma, bone densitometry showed a T score of -2.4 so we are treating this as osteoporosis.  Overall his back pain is improved considerably, he only uses 200 mg ibuprofen once every few days, he was informed that this was a significant underdosed.  Overall he feels good.  I reviewed the past medical history, family history, social history, surgical history, and allergies today and no changes were needed.  Please see the problem list section below in epic for further details.  Past Medical History: Past Medical History:  Diagnosis Date  . Anxiety    prn xanax started 01/2019  . Barrett's esophagus    Bx+ 2007, 2009, and 2020.  Marland Kitchen Chronic constipation    GI->Linzess trial 05/2019  . Contact dermatitis and other eczema, due to unspecified cause    +scarring  . COPD (chronic obstructive pulmonary disease) ([facility])    COPD changes noted on CT abd 2020  . Epididymal cyst 06/2018   Bilat: small on R, but 4 cm on left  Asymptomatic.  Marland Kitchen GERD (gastroesophageal reflux disease)   . History of adenomatous polyp of colon 08/10/2006   2007 and 12/2018; recall 12/2019.  Marland Kitchen History of vertebral compression fracture    Incidental finding on CT abd: T11&12->T score -2.4->fosamax started by Dr. Darene Lamer  . Hyperlipidemia   . Left foot pain    ? Arch strain.  Some numbness to suggest possible tarsal tunnel syndrome vs lumbar radiculitis (sports med MD).  Responded very well to custom orthotic.  . Migraine syndrome    verapamil prophylaxis  . Seasonal and perennial allergic rhinitis    vaccine/injection therapy helpful  . Small bowel obstruction ([place]) Fall of 2019   surgery->lysis of adhesions   Past Surgical History: Past Surgical History:  Procedure Laterality Date  . ABDOMINAL EXPLORATION SURGERY  Fall 2019   SBO->lysis of adhesions  . COLONOSCOPY W/ POLYPECTOMY   08/10/06; 12/2018   Several adenomatous polyps 2007 and 2020; recall 12/2019  . ESOPHAGOGASTRODUODENOSCOPY  2007 and 07/20/08;12/2018   GERD, BARRET's esoph (Dr. Henrene Pastor). Same 12/2018.  Marland Kitchen LE venous doppler u/s  04/19/15   L leg NORMAL  . right knee arthroscopy  1980s   Torn meniscus (Dr. Noemi Chapel)   Social History: Social History   Socioeconomic History  . Marital status: Married    Spouse name: Not on file  . Number of children: 1  . Years of education: Not on file  . Highest education level: Not on file  Occupational History  . Occupation: Designer, jewellery: Education administrator  Social Needs  . Financial resource strain: Not on file  . Food insecurity    Worry: Not on file    Inability: Not on file  . Transportation needs    Medical: Not on file    Non-medical: Not on file  Tobacco Use  . Smoking status: Former Smoker    Packs/day: 1.00    Years: 10.00    Pack years: 10.00    Types: Cigarettes    Quit date: 07/06/1993    Years since quitting: 25.9  . Smokeless tobacco: Never Used  Substance and Sexual Activity  . Alcohol use: Yes    Comment: 1 or 2 daily  . Drug use: No  . Sexual activity: Not on file  Lifestyle  . Physical activity  Days per week: Not on file    Minutes per session: Not on file  . Stress: Not on file  Relationships  . Social Herbalist on phone: Not on file    Gets together: Not on file    Attends religious service: Not on file    Active member of club or organization: Not on file    Attends meetings of clubs or organizations: Not on file    Relationship status: Not on file  Other Topics Concern  . Not on file  Social History Narrative   Married, one daughter.   Educ: [employer]   Occup: Biochemist, clinical for [employer].    No tobacco, 2 beers a night, no hx of problem drinking/drug use.   Caffeine: 2-3 cups coffee every morning.  Tea x 2 later in day.   Family History: Family History  Problem  Relation Age of Onset  . Other Father        chemical PNA siphoning gas  . Colon cancer Neg Hx   . Esophageal cancer Neg Hx   . Rectal cancer Neg Hx   . Stomach cancer Neg Hx    Allergies: No Known Allergies Medications: See med rec.  Review of Systems: No fevers, chills, night sweats, weight loss, chest pain, or shortness of breath.   Objective:    General: Well Developed, well nourished, and in no acute distress.  Neuro: Alert and oriented x3, extra-ocular muscles intact, sensation grossly intact.  HEENT: Normocephalic, atraumatic, pupils equal round reactive to light, neck supple, no masses, no lymphadenopathy, thyroid nonpalpable.  Skin: Warm and dry, no rashes. Cardiac: Regular rate and rhythm, no murmurs rubs or gallops, no lower extremity edema.  Respiratory: Clear to auscultation bilaterally. Not using accessory muscles, speaking in full sentences.  Impression and Recommendations:    Thoracic compression fracture [facility]) Though uncommon in an adult male he did have a T score of -2.4, we are treating this as osteoporosis. Thoracic compression fractures are healing.  Osteoporosis Though uncommon in an adult male he did have a T score of -2.4, we are treating this as osteoporosis. Continue Fosamax, and calcium and vitamin D supplementation as he is not taking this.  Lumbar spondylosis Occasional axial discogenic back pain, otherwise he is doing well. No further intervention needed. Return as needed.   ___________________________________________ Gwen Her. Dianah Field, M.D., ABFM., CAQSM. Primary Care and Sports Medicine [facility]  Adjunct Professor of [place] [employer] [facility] [employer] Medicine

## 2019-06-23 NOTE — Assessment & Plan Note (Signed)
Though uncommon in an adult male he did have a T score of -2.4, we are treating this as osteoporosis. Thoracic compression fractures are healing.

## 2019-06-23 NOTE — Assessment & Plan Note (Signed)
Occasional axial discogenic back pain, otherwise he is doing well. No further intervention needed. Return as needed.

## 2019-06-23 NOTE — Assessment & Plan Note (Signed)
Though uncommon in an adult male he did have a T score of -2.4, we are treating this as osteoporosis. Continue Fosamax, and calcium and vitamin D supplementation as he is not taking this.

## 2019-06-29 ENCOUNTER — Other Ambulatory Visit: Payer: Self-pay

## 2019-06-29 ENCOUNTER — Ambulatory Visit (INDEPENDENT_AMBULATORY_CARE_PROVIDER_SITE_OTHER): Payer: BC Managed Care – PPO | Admitting: Family Medicine

## 2019-06-29 ENCOUNTER — Encounter: Payer: Self-pay | Admitting: Family Medicine

## 2019-06-29 VITALS — BP 118/79 | HR 87 | Temp 97.7°F | Resp 16 | Ht 70.0 in | Wt 145.2 lb

## 2019-06-29 DIAGNOSIS — E7800 Pure hypercholesterolemia, unspecified: Secondary | ICD-10-CM | POA: Diagnosis not present

## 2019-06-29 DIAGNOSIS — Z1211 Encounter for screening for malignant neoplasm of colon: Secondary | ICD-10-CM

## 2019-06-29 DIAGNOSIS — Z0000 Encounter for general adult medical examination without abnormal findings: Secondary | ICD-10-CM | POA: Diagnosis not present

## 2019-06-29 DIAGNOSIS — Z125 Encounter for screening for malignant neoplasm of prostate: Secondary | ICD-10-CM | POA: Diagnosis not present

## 2019-06-29 DIAGNOSIS — Z23 Encounter for immunization: Secondary | ICD-10-CM | POA: Diagnosis not present

## 2019-06-29 LAB — LIPID PANEL
Cholesterol: 174 mg/dL (ref 0–200)
HDL: 72 mg/dL (ref >39.00)
LDL Cholesterol: 92 mg/dL (ref 0–99)
NonHDL: 101.84
Total CHOL/HDL Ratio: 2
Triglycerides: 47 mg/dL (ref 0.0–149.0)
VLDL: 9.4 mg/dL (ref 0.0–40.0)

## 2019-06-29 LAB — CBC WITH DIFFERENTIAL/PLATELET
Basophils Absolute: 0 10*3/uL (ref 0.0–0.1)
Basophils Relative: 0.7 % (ref 0.0–3.0)
Eosinophils Absolute: 0.1 10*3/uL (ref 0.0–0.7)
Eosinophils Relative: 1.5 % (ref 0.0–5.0)
HCT: 44.4 % (ref 39.0–52.0)
Hemoglobin: 14.7 g/dL (ref 13.0–17.0)
Lymphocytes Relative: 21.1 % (ref 12.0–46.0)
Lymphs Abs: 1.3 10*3/uL (ref 0.7–4.0)
MCHC: 33.1 g/dL (ref 30.0–36.0)
MCV: 97.4 fl (ref 78.0–100.0)
Monocytes Absolute: 0.7 10*3/uL (ref 0.1–1.0)
Monocytes Relative: 11.8 % (ref 3.0–12.0)
Neutro Abs: 3.9 10*3/uL (ref 1.4–7.7)
Neutrophils Relative %: 64.9 % (ref 43.0–77.0)
Platelets: 320 10*3/uL (ref 150.0–400.0)
RBC: 4.56 Mil/uL (ref 4.22–5.81)
RDW: 14 % (ref 11.5–15.5)
WBC: 6.1 10*3/uL (ref 4.0–10.5)

## 2019-06-29 LAB — COMPREHENSIVE METABOLIC PANEL
ALT: 26 U/L (ref 0–53)
AST: 23 U/L (ref 0–37)
Albumin: 4.5 g/dL (ref 3.5–5.2)
Alkaline Phosphatase: 86 U/L (ref 39–117)
BUN: 9 mg/dL (ref 6–23)
CO2: 29 mEq/L (ref 19–32)
Calcium: 9.8 mg/dL (ref 8.4–10.5)
Chloride: 103 mEq/L (ref 96–112)
Creatinine, Ser: 0.82 mg/dL (ref 0.40–1.50)
GFR: 94.35 mL/min (ref >60.00)
Glucose, Bld: 81 mg/dL (ref 70–99)
Potassium: 4.5 mEq/L (ref 3.5–5.1)
Sodium: 140 mEq/L (ref 135–145)
Total Bilirubin: 0.7 mg/dL (ref 0.2–1.2)
Total Protein: 6.8 g/dL (ref 6.0–8.3)

## 2019-06-29 LAB — PSA: PSA: 0.22 ng/mL (ref 0.10–4.00)

## 2019-06-29 MED ORDER — ALPRAZOLAM 0.5 MG PO TABS: ORAL_TABLET | ORAL | 1 refills | Status: DC

## 2019-06-29 NOTE — Progress Notes (Signed)
Office Note 06/29/2019  CC:  Chief Complaint  Patient presents with  . Annual Exam    pt is fasting    HPI:  Jacob Blanchard is a 63 y.o. White male who is here for annual health maintenance exam.  He does have GAD with hx of panic attack: has been improved on prn xanax for 5 mo. Reviewed PMP aware; most recent fill for xanax 0.5mg  was8/9/20, #60, rx'd by me.  No red flags.  Exercising: walking 1-2 miles qod.  Some yard work.  Diet: diet pretty healthy.   No acute complaints.  Anxiety: uses 1/2 xanax tab per day on average and it helps well with anxiety.   Past Medical History:  Diagnosis Date  . Anxiety    prn xanax started 01/2019  . Barrett's esophagus    Bx+ 2007, 2009, and 2020.  Marland Kitchen Chronic constipation    GI->Linzess trial 05/2019  . Contact dermatitis and other eczema, due to unspecified cause    +scarring  . COPD (chronic obstructive pulmonary disease) (HCC)    COPD changes noted on CT abd 2020  . Epididymal cyst 06/2018   Bilat: small on R, but 4 cm on left  Asymptomatic.  Marland Kitchen GERD (gastroesophageal reflux disease)   . History of adenomatous polyp of colon 08/10/2006   2007 and 12/2018; recall 12/2019.  Marland Kitchen History of vertebral compression fracture    Incidental finding on CT abd: T11&12->T score -2.4->fosamax started by Dr. Darene Lamer  . Hyperlipidemia   . Left foot pain    ? Arch strain.  Some numbness to suggest possible tarsal tunnel syndrome vs lumbar radiculitis (sports med MD).  Responded very well to custom orthotic.  . Migraine syndrome    verapamil prophylaxis  . Seasonal and perennial allergic rhinitis    vaccine/injection therapy helpful  . Small bowel obstruction ([place]) Fall of 2019   surgery->lysis of adhesions  . Vertebral compression fracture ([place]) 05/2019   T11 and T12, minimal trauma.  Dr. Darene Lamer put him on fosamax.    Past Surgical History:  Procedure Laterality Date  . ABDOMINAL EXPLORATION SURGERY  Fall 2019   SBO->lysis of adhesions  .  COLONOSCOPY W/ POLYPECTOMY  08/10/06; 12/2018   Several adenomatous polyps 2007 and 2020; recall 12/2019  . DEXA  05/24/2019   Dr. Bartholome Bill -2.4 + vert comp fx->fosamax started.  . ESOPHAGOGASTRODUODENOSCOPY  2007 and 07/20/08;12/2018   GERD, BARRET's esoph (Dr. Henrene Pastor). Same 12/2018.  Marland Kitchen LE venous doppler u/s  04/19/15   L leg NORMAL  . right knee arthroscopy  1980s   Torn meniscus (Dr. Noemi Chapel)    Family History  Problem Relation Age of Onset  . Other Father        chemical PNA siphoning gas  . Colon cancer Neg Hx   . Esophageal cancer Neg Hx   . Rectal cancer Neg Hx   . Stomach cancer Neg Hx     Social History   Socioeconomic History  . Marital status: Married    Spouse name: Not on file  . Number of children: 1  . Years of education: Not on file  . Highest education level: Not on file  Occupational History  . Occupation: Designer, jewellery: Education administrator  Social Needs  . Financial resource strain: Not on file  . Food insecurity    Worry: Not on file    Inability: Not on file  . Transportation needs    Medical: Not on  file    Non-medical: Not on file  Tobacco Use  . Smoking status: Former Smoker    Packs/day: 1.00    Years: 10.00    Pack years: 10.00    Types: Cigarettes    Quit date: 07/06/1993    Years since quitting: 25.9  . Smokeless tobacco: Never Used  Substance and Sexual Activity  . Alcohol use: Yes    Comment: 1 or 2 daily  . Drug use: No  . Sexual activity: Not on file  Lifestyle  . Physical activity    Days per week: Not on file    Minutes per session: Not on file  . Stress: Not on file  Relationships  . Social Herbalist on phone: Not on file    Gets together: Not on file    Attends religious service: Not on file    Active member of club or organization: Not on file    Attends meetings of clubs or organizations: Not on file    Relationship status: Not on file  . Intimate partner violence     Fear of current or ex partner: Not on file    Emotionally abused: Not on file    Physically abused: Not on file    Forced sexual activity: Not on file  Other Topics Concern  . Not on file  Social History Narrative   Married, one daughter.   Educ: [employer]   Occup: Biochemist, clinical for [employer].    No tobacco, 2 beers a night, no hx of problem drinking/drug use.   Caffeine: 2-3 cups coffee every morning.  Tea x 2 later in day.    Outpatient Medications Prior to Visit  Medication Sig Dispense Refill  . alendronate (FOSAMAX) 70 MG tablet Take 1 tablet (70 mg total) by mouth every 7 (seven) days. 4 tablet 11  . atorvastatin (LIPITOR) 40 MG tablet Take 1 tablet (40 mg total) by mouth daily. 90 tablet 3  . Calcium Carbonate-Vitamin D 600-400 MG-UNIT tablet Take 1 tablet by mouth 2 (two) times daily. 60 tablet 11  . cyclobenzaprine (FLEXERIL) 10 MG tablet TAKE 1 TABLET (10 MG TOTAL) BY MOUTH 2 (TWO) TIMES DAILY AS NEEDED. (Patient taking differently: Take 10 mg by mouth as needed. ) 30 tablet 3  . fluticasone (FLONASE) 50 MCG/ACT nasal spray Place 2 sprays into both nostrils daily. 16 g 11  . lansoprazole (PREVACID) 30 MG capsule Take 1 capsule (30 mg total) by mouth 2 (two) times a day. 180 capsule 3  . linaclotide (LINZESS) 290 MCG CAPS capsule Take 1 capsule (290 mcg total) by mouth daily before breakfast. 30 capsule 3  . verapamil (CALAN) 120 MG tablet Take 120 mg by mouth 2 (two) times daily.    Marland Kitchen ALPRAZolam (XANAX) 0.5 MG tablet TAKE 1 TO 2 TABLETS BY MOUTH TWICE A DAY AS NEEDED FOR ANXIETY (Patient taking differently: TAKE 0.5 TABLETS BY MOUTH AT BEDTIME AS NEEDED FOR ANXIETY) 60 tablet 1  . Ibuprofen (ADVIL) 200 MG CAPS Take by mouth.       No facility-administered medications prior to visit.     Not on File  ROS Review of Systems  Constitutional: Negative for appetite change, chills, fatigue and fever.  HENT: Negative for congestion, dental problem, ear pain and sore throat.    Eyes: Negative for discharge, redness and visual disturbance.  Respiratory: Negative for cough, chest tightness, shortness of breath and wheezing.   Cardiovascular: Negative for chest pain, palpitations and  leg swelling.  Gastrointestinal: Negative for abdominal pain, blood in stool, diarrhea, nausea and vomiting.  Genitourinary: Negative for difficulty urinating, dysuria, flank pain, frequency, hematuria and urgency.  Musculoskeletal: Positive for back pain (upper T spine, present since recent compression fx). Negative for arthralgias, joint swelling, myalgias and neck stiffness.  Skin: Negative for pallor and rash.  Neurological: Negative for dizziness, speech difficulty, weakness and headaches.  Hematological: Negative for adenopathy. Does not bruise/bleed easily.  Psychiatric/Behavioral: Negative for confusion and sleep disturbance. The patient is not nervous/anxious.     PE; Blood pressure 118/79, pulse 87, temperature 97.7 F (36.5 C), temperature source Temporal, resp. rate 16, height 5\' 10"  (1.778 m), weight 145 lb 3.2 oz (65.9 kg), SpO2 100 %. Body mass index is 20.83 kg/m.  Gen: Alert, well appearing.  Patient is oriented to person, place, time, and situation. AFFECT: pleasant, lucid thought and speech. ENT: Ears: EACs clear, normal epithelium.  TMs with good light reflex and landmarks bilaterally.  Eyes: no injection, icteris, swelling, or exudate.  EOMI, PERRLA. Nose: no drainage or turbinate edema/swelling.  No injection or focal lesion.  Mouth: lips without lesion/swelling.  Oral mucosa pink and moist.  Dentition intact and without obvious caries or gingival swelling.  Oropharynx without erythema, exudate, or swelling.  Neck: supple/nontender.  No LAD, mass, or TM.  Carotid pulses 2+ bilaterally, without bruits. CV: RRR, no m/r/g.   LUNGS: CTA bilat, nonlabored resps, good aeration in all lung fields. ABD: soft, NT, ND, BS normal.  No hepatospenomegaly or mass.  No  bruits. EXT: no clubbing, cyanosis, or edema.  Musculoskeletal: no joint swelling, erythema, warmth, or tenderness.  ROM of all joints intact. Skin - no sores or suspicious lesions or rashes or color changes Rectal exam: negative without mass, lesions or tenderness, PROSTATE EXAM: smooth and symmetric without nodules or tenderness.   Pertinent labs:  Lab Results  Component Value Date   TSH 1.94 06/23/2018   Lab Results  Component Value Date   WBC 5.7 06/23/2018   HGB 15.7 06/23/2018   HCT 46.1 06/23/2018   MCV 98.1 06/23/2018   PLT 441.0 (H) 06/23/2018   Lab Results  Component Value Date   CREATININE 0.82 06/23/2018   BUN 10 06/23/2018   NA 139 06/23/2018   K 5.3 (H) 06/23/2018   CL 103 06/23/2018   CO2 31 06/23/2018   Lab Results  Component Value Date   ALT 25 06/23/2018   AST 21 06/23/2018   ALKPHOS 68 06/23/2018   BILITOT 0.7 06/23/2018   Lab Results  Component Value Date   CHOL 143 06/23/2018   Lab Results  Component Value Date   HDL 59.60 06/23/2018   Lab Results  Component Value Date   LDLCALC 75 06/23/2018   Lab Results  Component Value Date   TRIG 45.0 06/23/2018   Lab Results  Component Value Date   CHOLHDL 2 06/23/2018   Lab Results  Component Value Date   PSA 0.17 06/23/2018   PSA 0.29 06/17/2017   PSA 0.21 06/04/2016    ASSESSMENT AND PLAN:   Health maintenance exam: Reviewed age and gender appropriate health maintenance issues (prudent diet, regular exercise, health risks of tobacco and excessive alcohol, use of seatbelts, fire alarms in home, use of sunscreen).  Also reviewed age and gender appropriate health screening as well as vaccine recommendations. Vaccines: Flu vaccine-->given today.  Otherwise all vaccines UTD. Labs: CBC, CMET, FLP, PSA. Prostate ca screening: DRE normal today , PSA. Colon ca  screening: next colonoscopy 12/2019.  An After Visit Summary was printed and given to the patient.  FOLLOW UP:  Return in about 6  months (around 12/27/2019) for telemedicine f/u for GAD/xanax.  Signed:  Crissie Sickles, MD           06/29/2019

## 2019-06-29 NOTE — Patient Instructions (Signed)

## 2019-06-29 NOTE — Addendum Note (Signed)
Addended by: Deveron Furlong D on: 06/29/2019 08:42 AM   Modules accepted: Orders

## 2019-07-06 DIAGNOSIS — M50323 Other cervical disc degeneration at C6-C7 level: Secondary | ICD-10-CM | POA: Diagnosis not present

## 2019-07-06 DIAGNOSIS — M9901 Segmental and somatic dysfunction of cervical region: Secondary | ICD-10-CM | POA: Diagnosis not present

## 2019-07-06 DIAGNOSIS — M50321 Other cervical disc degeneration at C4-C5 level: Secondary | ICD-10-CM | POA: Diagnosis not present

## 2019-07-06 DIAGNOSIS — M50322 Other cervical disc degeneration at C5-C6 level: Secondary | ICD-10-CM | POA: Diagnosis not present

## 2019-07-08 ENCOUNTER — Encounter: Payer: Self-pay | Admitting: Family Medicine

## 2019-08-03 DIAGNOSIS — M9901 Segmental and somatic dysfunction of cervical region: Secondary | ICD-10-CM | POA: Diagnosis not present

## 2019-08-03 DIAGNOSIS — M50321 Other cervical disc degeneration at C4-C5 level: Secondary | ICD-10-CM | POA: Diagnosis not present

## 2019-08-03 DIAGNOSIS — M50323 Other cervical disc degeneration at C6-C7 level: Secondary | ICD-10-CM | POA: Diagnosis not present

## 2019-08-03 DIAGNOSIS — M50322 Other cervical disc degeneration at C5-C6 level: Secondary | ICD-10-CM | POA: Diagnosis not present

## 2019-08-29 DIAGNOSIS — M50321 Other cervical disc degeneration at C4-C5 level: Secondary | ICD-10-CM | POA: Diagnosis not present

## 2019-08-29 DIAGNOSIS — M9901 Segmental and somatic dysfunction of cervical region: Secondary | ICD-10-CM | POA: Diagnosis not present

## 2019-08-29 DIAGNOSIS — M50323 Other cervical disc degeneration at C6-C7 level: Secondary | ICD-10-CM | POA: Diagnosis not present

## 2019-08-29 DIAGNOSIS — M50322 Other cervical disc degeneration at C5-C6 level: Secondary | ICD-10-CM | POA: Diagnosis not present

## 2019-09-15 ENCOUNTER — Other Ambulatory Visit: Payer: Self-pay | Admitting: Gastroenterology

## 2019-09-26 DIAGNOSIS — M9901 Segmental and somatic dysfunction of cervical region: Secondary | ICD-10-CM | POA: Diagnosis not present

## 2019-09-26 DIAGNOSIS — M50321 Other cervical disc degeneration at C4-C5 level: Secondary | ICD-10-CM | POA: Diagnosis not present

## 2019-09-26 DIAGNOSIS — M50323 Other cervical disc degeneration at C6-C7 level: Secondary | ICD-10-CM | POA: Diagnosis not present

## 2019-09-26 DIAGNOSIS — M50322 Other cervical disc degeneration at C5-C6 level: Secondary | ICD-10-CM | POA: Diagnosis not present

## 2019-10-06 DIAGNOSIS — K5790 Diverticulosis of intestine, part unspecified, without perforation or abscess without bleeding: Secondary | ICD-10-CM

## 2019-10-06 HISTORY — DX: Diverticulosis of intestine, part unspecified, without perforation or abscess without bleeding: K57.90

## 2019-11-02 ENCOUNTER — Ambulatory Visit: Payer: BC Managed Care – PPO

## 2019-11-02 DIAGNOSIS — M50322 Other cervical disc degeneration at C5-C6 level: Secondary | ICD-10-CM | POA: Diagnosis not present

## 2019-11-02 DIAGNOSIS — M9901 Segmental and somatic dysfunction of cervical region: Secondary | ICD-10-CM | POA: Diagnosis not present

## 2019-11-02 DIAGNOSIS — M50321 Other cervical disc degeneration at C4-C5 level: Secondary | ICD-10-CM | POA: Diagnosis not present

## 2019-11-02 DIAGNOSIS — M50323 Other cervical disc degeneration at C6-C7 level: Secondary | ICD-10-CM | POA: Diagnosis not present

## 2019-11-17 ENCOUNTER — Ambulatory Visit: Payer: BC Managed Care – PPO

## 2019-11-23 ENCOUNTER — Ambulatory Visit: Payer: BC Managed Care – PPO

## 2019-12-07 DIAGNOSIS — M50323 Other cervical disc degeneration at C6-C7 level: Secondary | ICD-10-CM | POA: Diagnosis not present

## 2019-12-07 DIAGNOSIS — M50321 Other cervical disc degeneration at C4-C5 level: Secondary | ICD-10-CM | POA: Diagnosis not present

## 2019-12-07 DIAGNOSIS — M9901 Segmental and somatic dysfunction of cervical region: Secondary | ICD-10-CM | POA: Diagnosis not present

## 2019-12-07 DIAGNOSIS — M50322 Other cervical disc degeneration at C5-C6 level: Secondary | ICD-10-CM | POA: Diagnosis not present

## 2019-12-10 ENCOUNTER — Other Ambulatory Visit: Payer: Self-pay | Admitting: Gastroenterology

## 2019-12-11 DIAGNOSIS — G44009 Cluster headache syndrome, unspecified, not intractable: Secondary | ICD-10-CM | POA: Diagnosis not present

## 2019-12-12 ENCOUNTER — Encounter: Payer: Self-pay | Admitting: Gastroenterology

## 2019-12-12 ENCOUNTER — Ambulatory Visit: Payer: BC Managed Care – PPO | Admitting: Gastroenterology

## 2019-12-12 VITALS — BP 142/80 | HR 80 | Temp 97.2°F | Ht 70.0 in | Wt 154.0 lb

## 2019-12-12 DIAGNOSIS — Z860100 Personal history of colon polyps, unspecified: Secondary | ICD-10-CM

## 2019-12-12 DIAGNOSIS — K5909 Other constipation: Secondary | ICD-10-CM

## 2019-12-12 DIAGNOSIS — K429 Umbilical hernia without obstruction or gangrene: Secondary | ICD-10-CM

## 2019-12-12 MED ORDER — NA SULFATE-K SULFATE-MG SULF 17.5-3.13-1.6 GM/177ML PO SOLN: 1.0000 | Freq: Once | ORAL | 0 refills | Status: AC

## 2019-12-12 MED ORDER — LINACLOTIDE 290 MCG PO CAPS: ORAL_CAPSULE | ORAL | 1 refills | Status: DC

## 2019-12-12 NOTE — Progress Notes (Signed)
12/12/2019 Jacob Blanchard [id number] 09-12-1956   HISTORY OF PRESENT ILLNESS:  This is a 64 year old male who is a patient of Dr. Blanch Media.  He has history of small bowel obstruction related to adhesions.  Had ex-lap with lysis of adhesions in 06/2018.  I saw him in 05/2019 for complaints of constipation.  Started him on Linzess 290 mcg daily.  He says that he takes it every evening around dinnertime.  Says that it seems to work for several days but then he seems to go a few days without a BM intermittently.  Wanted to discuss and see what else he can take to help more.  Colonoscopy 12/2018 showed the following:  - One 5 mm polyp in the transverse colon, removed with a cold snare. Resected and retrieved. - Three 5 to 15 mm polyps in the cecum, removed with a cold snare. Resected and retrieved.  Injected. - Diverticulosis in the left colon. - The examination was otherwise normal on direct and retroflexion views.  **Pathology showed tubular adenomas.  Repeat was recommended in one year.  Also complaining of umbilical hernia, says that it seems to be getting bigger and causes discomfort at times.  Past Medical History:  Diagnosis Date  . Anxiety    prn xanax started 01/2019  . Barrett's esophagus    Bx+ 2007, 2009, and 2020.  Marland Kitchen Chronic constipation    GI->Linzess trial 05/2019  . Contact dermatitis and other eczema, due to unspecified cause    +scarring  . COPD (chronic obstructive pulmonary disease) (HCC)    COPD changes noted on CT abd 2020  . Epididymal cyst 06/2018   Bilat: small on R, but 4 cm on left  Asymptomatic.  Marland Kitchen GERD (gastroesophageal reflux disease)   . History of adenomatous polyp of colon 08/10/2006   2007 and 12/2018; recall 12/2019.  Marland Kitchen History of vertebral compression fracture 2020   Incidental finding on CT abd: T11&12->T score -2.4->fosamax started by Dr. Darene Lamer  . Hyperlipidemia   . Left foot pain    ? Arch strain.  Some numbness to suggest possible tarsal tunnel  syndrome vs lumbar radiculitis (sports med MD).  Responded very well to custom orthotic.  . Migraine syndrome    verapamil prophylaxis  . Seasonal and perennial allergic rhinitis    vaccine/injection therapy helpful  . Small bowel obstruction ([place]) Fall of 2019   surgery->lysis of adhesions  . Vertebral compression fracture ([place]) 05/2019   T11 and T12, minimal trauma.  Dr. Darene Lamer put him on fosamax.   Past Surgical History:  Procedure Laterality Date  . ABDOMINAL EXPLORATION SURGERY  Fall 2019   SBO->lysis of adhesions  . COLONOSCOPY W/ POLYPECTOMY  08/10/06; 12/2018   Several adenomatous polyps 2007 and 2020; recall 12/2019  . DEXA  05/24/2019   Dr. Bartholome Bill -2.4 + vert comp fx->fosamax started.  . ESOPHAGOGASTRODUODENOSCOPY  2007 and 07/20/08;12/2018   GERD, BARRET's esoph (Dr. Henrene Pastor). Same 12/2018.  Marland Kitchen LE venous doppler u/s  04/19/15   L leg NORMAL  . right knee arthroscopy  1980s   Torn meniscus (Dr. Noemi Chapel)    reports that he quit smoking about 26 years ago. His smoking use included cigarettes. He has a 10.00 pack-year smoking history. He has never used smokeless tobacco. He reports current alcohol use. He reports that he does not use drugs. family history includes Other in his father. No Known Allergies    Outpatient Encounter Medications as of 12/12/2019  Medication Sig  .  alendronate (FOSAMAX) 70 MG tablet Take 1 tablet (70 mg total) by mouth every 7 (seven) days.  . ALPRAZolam (XANAX) 0.5 MG tablet Take 1/2-1 tab po bid prn anxiety  . atorvastatin (LIPITOR) 40 MG tablet Take 1 tablet (40 mg total) by mouth daily.  . Calcium Carbonate-Vitamin D 600-400 MG-UNIT tablet Take 1 tablet by mouth 2 (two) times daily.  . cyclobenzaprine (FLEXERIL) 10 MG tablet TAKE 1 TABLET (10 MG TOTAL) BY MOUTH 2 (TWO) TIMES DAILY AS NEEDED. (Patient taking differently: Take 10 mg by mouth as needed. )  . fluticasone (FLONASE) 50 MCG/ACT nasal spray Place 2 sprays into both nostrils daily.  . Ibuprofen  (ADVIL) 200 MG CAPS Take by mouth.    . lansoprazole (PREVACID) 30 MG capsule Take 1 capsule (30 mg total) by mouth 2 (two) times a day.  Marland Kitchen LINZESS 290 MCG CAPS capsule TAKE 1 CAPSULE (290 MCG TOTAL) BY MOUTH DAILY BEFORE BREAKFAST.  Marland Kitchen verapamil (CALAN) 120 MG tablet Take 120 mg by mouth 2 (two) times daily.   No facility-administered encounter medications on file as of 12/12/2019.    REVIEW OF SYSTEMS  : All other systems reviewed and negative except where noted in the History of Present Illness.  PHYSICAL EXAM: BP (!) 142/80   Pulse 80   Temp (!) 97.2 F (36.2 C)   Ht 5\' 10"  (1.778 m)   Wt 154 lb (69.9 kg)   BMI 22.10 kg/m  General: Well developed white male in no acute distress Head: Normocephalic and atraumatic Eyes:  Sclerae anicteric, conjunctiva pink. Ears: Normal auditory acuity  Lungs: Clear throughout to auscultation; no increased WOB. Heart: Regular rate and rhythm; no M/R/G. Abdomen: Soft, non-distended.  BS present.  Non-tender.  Umbilical hernia noted, reducible and non-tender. Rectal:  Will be done at the time of colonoscopy. Musculoskeletal: Symmetrical with no gross deformities  Skin: No lesions on visible extremities Extremities: No edema  Neurological: Alert oriented x 4, grossly non-focal Psychological:  Alert and cooperative. Normal mood and affect  ASSESSMENT AND PLAN: *Personal history of colon polyps:  Had tubular adenomas, one 15 mm in size in the cecum.  Repeat recommended in one year.  Will schedule colonoscopy with Dr. Henrene Pastor. *Chronic constipation:  Better on Linzess 290 mcg daily.  Will continue for now and add Miralax daily as well. *Umbilical hernia:  Says increasing in size and causing discomfort at times.  Will refer to general surgery to discuss repair.  **The risks, benefits, and alternatives to colonoscopy were discussed with the patient and hes consents to proceed.   CC:  McGowen, Adrian Blackwater, MD

## 2019-12-12 NOTE — Patient Instructions (Addendum)
If you are age 66 or older, your body mass index should be between 23-30. Your Body mass index is 22.1 kg/m. If this is out of the aforementioned range listed, please consider follow up with your Primary Care Provider.  If you are age 43 or younger, your body mass index should be between 19-25. Your Body mass index is 22.1 kg/m. If this is out of the aformentioned range listed, please consider follow up with your Primary Care Provider.   You have been scheduled for a colonoscopy. Please follow written instructions given to you at your visit today.  Please pick up your prep supplies at the pharmacy within the next 1-3 days. If you use inhalers (even only as needed), please bring them with you on the day of your procedure.  Continue Linzess 290 mcg daily.   Add Miralax daily with Linzess.     Call in one month if still having problems.   Referral placed to [facility].

## 2019-12-14 ENCOUNTER — Encounter: Payer: Self-pay | Admitting: Gastroenterology

## 2019-12-14 DIAGNOSIS — Z860101 Personal history of adenomatous and serrated colon polyps: Secondary | ICD-10-CM | POA: Insufficient documentation

## 2019-12-14 DIAGNOSIS — K429 Umbilical hernia without obstruction or gangrene: Secondary | ICD-10-CM | POA: Insufficient documentation

## 2019-12-14 DIAGNOSIS — Z86010 Personal history of colonic polyps: Secondary | ICD-10-CM | POA: Insufficient documentation

## 2019-12-14 NOTE — Progress Notes (Signed)
Assessment and plan reviewed 

## 2019-12-22 ENCOUNTER — Telehealth: Payer: Self-pay | Admitting: *Deleted

## 2019-12-22 NOTE — Telephone Encounter (Signed)
Patient has scheduled appointment on Thursday 12/28/19

## 2019-12-28 ENCOUNTER — Encounter: Payer: Self-pay | Admitting: Family Medicine

## 2019-12-28 DIAGNOSIS — K432 Incisional hernia without obstruction or gangrene: Secondary | ICD-10-CM | POA: Diagnosis not present

## 2019-12-29 ENCOUNTER — Ambulatory Visit: Payer: Self-pay | Admitting: General Surgery

## 2020-01-04 DIAGNOSIS — M50322 Other cervical disc degeneration at C5-C6 level: Secondary | ICD-10-CM | POA: Diagnosis not present

## 2020-01-04 DIAGNOSIS — M50323 Other cervical disc degeneration at C6-C7 level: Secondary | ICD-10-CM | POA: Diagnosis not present

## 2020-01-04 DIAGNOSIS — M50321 Other cervical disc degeneration at C4-C5 level: Secondary | ICD-10-CM | POA: Diagnosis not present

## 2020-01-04 DIAGNOSIS — M9901 Segmental and somatic dysfunction of cervical region: Secondary | ICD-10-CM | POA: Diagnosis not present

## 2020-01-25 ENCOUNTER — Encounter: Payer: Self-pay | Admitting: Internal Medicine

## 2020-01-25 ENCOUNTER — Other Ambulatory Visit: Payer: Self-pay

## 2020-01-25 ENCOUNTER — Ambulatory Visit (AMBULATORY_SURGERY_CENTER): Payer: BC Managed Care – PPO | Admitting: Internal Medicine

## 2020-01-25 VITALS — BP 128/79 | HR 98 | Temp 95.9°F | Resp 11 | Ht 70.0 in | Wt 154.0 lb

## 2020-01-25 DIAGNOSIS — D122 Benign neoplasm of ascending colon: Secondary | ICD-10-CM | POA: Diagnosis not present

## 2020-01-25 DIAGNOSIS — Z860100 Personal history of colon polyps, unspecified: Secondary | ICD-10-CM

## 2020-01-25 DIAGNOSIS — K5909 Other constipation: Secondary | ICD-10-CM

## 2020-01-25 DIAGNOSIS — D124 Benign neoplasm of descending colon: Secondary | ICD-10-CM

## 2020-01-25 DIAGNOSIS — K635 Polyp of colon: Secondary | ICD-10-CM | POA: Diagnosis not present

## 2020-01-25 DIAGNOSIS — Z1211 Encounter for screening for malignant neoplasm of colon: Secondary | ICD-10-CM | POA: Diagnosis not present

## 2020-01-25 MED ORDER — SODIUM CHLORIDE 0.9 % IV SOLN: 500.0000 mL | Freq: Once | INTRAVENOUS | Status: DC

## 2020-01-25 NOTE — Progress Notes (Signed)
CW- vitals KA- temp

## 2020-01-25 NOTE — Op Note (Signed)
[facility] Patient Name: Jacob Blanchard Procedure Date: 01/25/2020 8:44 AM MRN: [id number] Endoscopist: Docia Chuck. Henrene Pastor , MD Age: 64 Referring MD:  Date of Birth: 10-Mar-1956 Gender: Male Account #: [id number] Procedure:                Colonoscopy with cold snare polypectomy x 4 Indications:              High risk colon cancer surveillance: Personal                            history of adenoma (10 mm or greater in size), High                            risk colon cancer surveillance: Personal history of                            multiple (3 or more) adenomas. Index exam 2007 with                            nonadvanced adenoma. Subsequent examination March                            2020 with 4 polyps. Several large sessile cecal                            polyps removed piecemeal. Now for follow-up. Medicines:                Monitored Anesthesia Care Procedure:                Pre-Anesthesia Assessment:                           - Prior to the procedure, a History and Physical                            was performed, and patient medications and                            allergies were reviewed. The patient's tolerance of                            previous anesthesia was also reviewed. The risks                            and benefits of the procedure and the sedation                            options and risks were discussed with the patient.                            All questions were answered, and informed consent                            was obtained. Prior Anticoagulants: The patient has  taken no previous anticoagulant or antiplatelet                            agents. ASA Grade Assessment: II - A patient with                            mild systemic disease. After reviewing the risks                            and benefits, the patient was deemed in                            satisfactory condition to undergo the procedure.                            After obtaining informed consent, the colonoscope                            was passed under direct vision. Throughout the                            procedure, the patient's blood pressure, pulse, and                            oxygen saturations were monitored continuously. The                            Colonoscope was introduced through the anus and                            advanced to the the cecum, identified by                            appendiceal orifice and ileocecal valve. The                            ileocecal valve, appendiceal orifice, and rectum                            were photographed. The quality of the bowel                            preparation was excellent. The colonoscopy was                            performed without difficulty. The patient tolerated                            the procedure well. The bowel preparation used was                            SUPREP via split dose instruction. Scope In: 9:05:36 AM Scope Out: 9:21:13 AM Scope Withdrawal Time: 0 hours 10 minutes 55 seconds  Total Procedure Duration: 0  hours 15 minutes 37 seconds  Findings:                 Four polyps were found in the descending colon and                            ascending colon. The polyps were 1 to 2 mm in size.                            These polyps were removed with a cold snare.                            Resection and retrieval were complete.                           Multiple diverticula were found in the sigmoid                            colon.                           The exam was otherwise without abnormality on                            direct and retroflexion views. Complications:            No immediate complications. Estimated blood loss:                            None. Estimated Blood Loss:     Estimated blood loss: none. Impression:               - Four 1 to 2 mm polyps in the descending colon and                            in the ascending  colon, removed with a cold snare.                            Resected and retrieved.                           - Diverticulosis in the sigmoid colon.                           - The examination was otherwise normal on direct                            and retroflexion views. Recommendation:           - Repeat colonoscopy in 3 years for surveillance.                           - Patient has a contact number available for                            emergencies. The signs and symptoms of potential  delayed complications were discussed with the                            patient. Return to normal activities tomorrow.                            Written discharge instructions were provided to the                            patient.                           - Resume previous diet.                           - Continue present medications.                           - Await pathology results. Docia Chuck. Henrene Pastor, MD 01/25/2020 9:35:41 AM This report has been signed electronically.

## 2020-01-25 NOTE — Patient Instructions (Signed)
Handouts provided on polyps and diverticulosis.  ? ?YOU HAD AN ENDOSCOPIC PROCEDURE TODAY AT THE [facility]:   Refer to the procedure report that was given to you for any specific questions about what was found during the examination.  If the procedure report does not answer your questions, please call your gastroenterologist to clarify.  If you requested that your care partner not be given the details of your procedure findings, then the procedure report has been included in a sealed envelope for you to review at your convenience later. ? ?YOU SHOULD EXPECT: Some feelings of bloating in the abdomen. Passage of more gas than usual.  Walking can help get rid of the air that was put into your GI tract during the procedure and reduce the bloating. If you had a lower endoscopy (such as a colonoscopy or flexible sigmoidoscopy) you may notice spotting of blood in your stool or on the toilet paper. If you underwent a bowel prep for your procedure, you may not have a normal bowel movement for a few days. ? ?Please Note:  You might notice some irritation and congestion in your nose or some drainage.  This is from the oxygen used during your procedure.  There is no need for concern and it should clear up in a day or so. ? ?SYMPTOMS TO REPORT IMMEDIATELY: ? ?Following lower endoscopy (colonoscopy or flexible sigmoidoscopy): ? Excessive amounts of blood in the stool ? Significant tenderness or worsening of abdominal pains ? Swelling of the abdomen that is new, acute ? Fever of 100?F or higher ? ?For urgent or emergent issues, a gastroenterologist can be reached at any hour by calling [phone]. ?Do not use MyChart messaging for urgent concerns.  ? ? ?DIET:  We do recommend a small meal at first, but then you may proceed to your regular diet.  Drink plenty of fluids but you should avoid alcoholic beverages for 24 hours. ? ?ACTIVITY:  You should plan to take it easy for the rest of today and you should NOT  DRIVE or use heavy machinery until tomorrow (because of the sedation medicines used during the test).   ? ?FOLLOW UP: ?Our staff will call the number listed on your records 48-72 hours following your procedure to check on you and address any questions or concerns that you may have regarding the information given to you following your procedure. If we do not reach you, we will leave a message.  We will attempt to reach you two times.  During this call, we will ask if you have developed any symptoms of COVID 19. If you develop any symptoms (ie: fever, flu-like symptoms, shortness of breath, cough etc.) before then, please call [phone].  If you test positive for Covid 19 in the 2 weeks post procedure, please call and report this information to us.   ? ?If any biopsies were taken you will be contacted by phone or by letter within the next 1-3 weeks.  Please call us at [phone] if you have not heard about the biopsies in 3 weeks.  ? ? ?SIGNATURES/CONFIDENTIALITY: ?You and/or your care partner have signed paperwork which will be entered into your electronic medical record.  These signatures attest to the fact that that the information above on your After Visit Summary has been reviewed and is understood.  Full responsibility of the confidentiality of this discharge information lies with you and/or your care-partner. ? ?

## 2020-01-25 NOTE — Progress Notes (Signed)
Report to PACU, RN, vss, BBS= Clear.  

## 2020-01-29 ENCOUNTER — Other Ambulatory Visit: Payer: Self-pay | Admitting: Family Medicine

## 2020-01-29 ENCOUNTER — Encounter: Payer: Self-pay | Admitting: Internal Medicine

## 2020-01-29 ENCOUNTER — Telehealth: Payer: Self-pay

## 2020-01-29 NOTE — Telephone Encounter (Signed)
  Follow up Call-  Call back number 01/25/2020 12/22/2018  Post procedure Call Back phone  # [phone]  Permission to leave phone message Yes Yes  Some recent data might be hidden     Patient questions:  Do you have a fever, pain , or abdominal swelling? No. Pain Score  0 *  Have you tolerated food without any problems? Yes.    Have you been able to return to your normal activities? Yes.    Do you have any questions about your discharge instructions: Diet   No. Medications  No. Follow up visit  No.  Do you have questions or concerns about your Care? No.  Actions: * If pain score is 4 or above: No action needed, pain <4.  1. Have you developed a fever since your procedure? no  2.   Have you had an respiratory symptoms (SOB or cough) since your procedure? no  3.   Have you tested positive for COVID 19 since your procedure no  4.   Have you had any family members/close contacts diagnosed with the COVID 19 since your procedure?  no   If yes to any of these questions please route to Joylene John, RN and Erenest Rasher, RN

## 2020-01-31 ENCOUNTER — Other Ambulatory Visit: Payer: Self-pay

## 2020-01-31 ENCOUNTER — Encounter (HOSPITAL_BASED_OUTPATIENT_CLINIC_OR_DEPARTMENT_OTHER): Payer: Self-pay | Admitting: General Surgery

## 2020-01-31 NOTE — Progress Notes (Signed)
Spoke w/ via phone for pre-op interview---patient Lab needs dos----I stat 8               COVID test ------02-05-2020 @1450  Arrive at -------630 am 02-08-2020 NPO after ------midnight Medications to take morning of surgery -----alprazolam prn, verapamil, flonase, prevacid Diabetic medication -----n/a Patient Special Instructions -----none Pre-Op special Istructions -----none Patient verbalized understanding of instructions that were given at this phone interview. Patient denies shortness of breath, chest pain, fever, cough a this phone interview.

## 2020-02-01 DIAGNOSIS — M50323 Other cervical disc degeneration at C6-C7 level: Secondary | ICD-10-CM | POA: Diagnosis not present

## 2020-02-01 DIAGNOSIS — M50322 Other cervical disc degeneration at C5-C6 level: Secondary | ICD-10-CM | POA: Diagnosis not present

## 2020-02-01 DIAGNOSIS — M9901 Segmental and somatic dysfunction of cervical region: Secondary | ICD-10-CM | POA: Diagnosis not present

## 2020-02-01 DIAGNOSIS — M50321 Other cervical disc degeneration at C4-C5 level: Secondary | ICD-10-CM | POA: Diagnosis not present

## 2020-02-05 ENCOUNTER — Other Ambulatory Visit (HOSPITAL_COMMUNITY)
Admission: RE | Admit: 2020-02-05 | Discharge: 2020-02-05 | Disposition: A | Discharge status: Home / Self Care | Payer: BC Managed Care – PPO | Source: Ambulatory Visit | Attending: General Surgery | Admitting: General Surgery

## 2020-02-05 DIAGNOSIS — Z01812 Encounter for preprocedural laboratory examination: Secondary | ICD-10-CM | POA: Diagnosis not present

## 2020-02-05 DIAGNOSIS — Z20822 Contact with and (suspected) exposure to covid-19: Secondary | ICD-10-CM | POA: Insufficient documentation

## 2020-02-06 LAB — SARS CORONAVIRUS 2 (TAT 6-24 HRS): SARS Coronavirus 2: NEGATIVE

## 2020-02-08 ENCOUNTER — Encounter (HOSPITAL_BASED_OUTPATIENT_CLINIC_OR_DEPARTMENT_OTHER): Payer: Self-pay | Admitting: General Surgery

## 2020-02-08 ENCOUNTER — Ambulatory Visit (HOSPITAL_BASED_OUTPATIENT_CLINIC_OR_DEPARTMENT_OTHER): Payer: BC Managed Care – PPO | Admitting: Anesthesiology

## 2020-02-08 ENCOUNTER — Encounter (HOSPITAL_BASED_OUTPATIENT_CLINIC_OR_DEPARTMENT_OTHER)
Admission: RE | Disposition: A | Discharge status: Home / Self Care | Payer: Self-pay | Source: Home / Self Care | Attending: General Surgery

## 2020-02-08 ENCOUNTER — Ambulatory Visit (HOSPITAL_BASED_OUTPATIENT_CLINIC_OR_DEPARTMENT_OTHER)
Admission: RE | Admit: 2020-02-08 | Discharge: 2020-02-08 | Disposition: A | Discharge status: Home / Self Care | Payer: BC Managed Care – PPO | Attending: General Surgery | Admitting: General Surgery

## 2020-02-08 DIAGNOSIS — J45909 Unspecified asthma, uncomplicated: Secondary | ICD-10-CM | POA: Insufficient documentation

## 2020-02-08 DIAGNOSIS — K432 Incisional hernia without obstruction or gangrene: Secondary | ICD-10-CM | POA: Diagnosis not present

## 2020-02-08 DIAGNOSIS — Z8719 Personal history of other diseases of the digestive system: Secondary | ICD-10-CM | POA: Diagnosis not present

## 2020-02-08 DIAGNOSIS — Z86010 Personal history of colonic polyps: Secondary | ICD-10-CM | POA: Diagnosis not present

## 2020-02-08 DIAGNOSIS — Z79899 Other long term (current) drug therapy: Secondary | ICD-10-CM | POA: Diagnosis not present

## 2020-02-08 DIAGNOSIS — K219 Gastro-esophageal reflux disease without esophagitis: Secondary | ICD-10-CM | POA: Insufficient documentation

## 2020-02-08 DIAGNOSIS — E785 Hyperlipidemia, unspecified: Secondary | ICD-10-CM | POA: Insufficient documentation

## 2020-02-08 DIAGNOSIS — Z885 Allergy status to narcotic agent status: Secondary | ICD-10-CM | POA: Insufficient documentation

## 2020-02-08 DIAGNOSIS — Z791 Long term (current) use of non-steroidal anti-inflammatories (NSAID): Secondary | ICD-10-CM | POA: Insufficient documentation

## 2020-02-08 DIAGNOSIS — K5909 Other constipation: Secondary | ICD-10-CM | POA: Insufficient documentation

## 2020-02-08 DIAGNOSIS — F419 Anxiety disorder, unspecified: Secondary | ICD-10-CM | POA: Diagnosis not present

## 2020-02-08 DIAGNOSIS — M1990 Unspecified osteoarthritis, unspecified site: Secondary | ICD-10-CM | POA: Diagnosis not present

## 2020-02-08 DIAGNOSIS — G43909 Migraine, unspecified, not intractable, without status migrainosus: Secondary | ICD-10-CM | POA: Insufficient documentation

## 2020-02-08 DIAGNOSIS — Z87891 Personal history of nicotine dependence: Secondary | ICD-10-CM | POA: Diagnosis not present

## 2020-02-08 HISTORY — DX: Incisional hernia without obstruction or gangrene: K43.2

## 2020-02-08 HISTORY — PX: INCISIONAL HERNIA REPAIR: SHX193

## 2020-02-08 LAB — POCT I-STAT, CHEM 8
BUN: 9 mg/dL (ref 8–23)
Calcium, Ion: 1.09 mmol/L — ABNORMAL LOW (ref 1.15–1.40)
Chloride: 102 mmol/L (ref 98–111)
Creatinine, Ser: 0.9 mg/dL (ref 0.61–1.24)
Glucose, Bld: 86 mg/dL (ref 70–99)
HCT: 47 % (ref 39.0–52.0)
Hemoglobin: 16 g/dL (ref 13.0–17.0)
Potassium: 3.9 mmol/L (ref 3.5–5.1)
Sodium: 139 mmol/L (ref 135–145)
TCO2: 26 mmol/L (ref 22–32)

## 2020-02-08 SURGERY — REPAIR, HERNIA, INCISIONAL, LAPAROSCOPIC
Anesthesia: General | Site: Abdomen

## 2020-02-08 MED ORDER — CHLORHEXIDINE GLUCONATE CLOTH 2 % EX PADS: 6.0000 | MEDICATED_PAD | Freq: Once | CUTANEOUS | Status: DC

## 2020-02-08 MED ORDER — ACETAMINOPHEN 500 MG PO TABS
ORAL_TABLET | ORAL | Status: AC
  Filled 2020-02-08: qty 2

## 2020-02-08 MED ORDER — CEFAZOLIN SODIUM-DEXTROSE 2-4 GM/100ML-% IV SOLN
2.0000 g | INTRAVENOUS | Status: AC
  Administered 2020-02-08: 2 g via INTRAVENOUS

## 2020-02-08 MED ORDER — LIDOCAINE 2% (20 MG/ML) 5 ML SYRINGE
INTRAMUSCULAR | Status: DC | PRN
  Administered 2020-02-08: 75 mg via INTRAVENOUS

## 2020-02-08 MED ORDER — ONDANSETRON HCL 4 MG/2ML IJ SOLN
INTRAMUSCULAR | Status: DC | PRN
  Administered 2020-02-08: 4 mg via INTRAVENOUS

## 2020-02-08 MED ORDER — FENTANYL CITRATE (PF) 100 MCG/2ML IJ SOLN: 25.0000 ug | INTRAMUSCULAR | Status: DC | PRN

## 2020-02-08 MED ORDER — GABAPENTIN 300 MG PO CAPS
300.0000 mg | ORAL_CAPSULE | ORAL | Status: AC
  Administered 2020-02-08: 300 mg via ORAL

## 2020-02-08 MED ORDER — FENTANYL CITRATE (PF) 250 MCG/5ML IJ SOLN
INTRAMUSCULAR | Status: DC | PRN
  Administered 2020-02-08 (×2): 100 ug via INTRAVENOUS

## 2020-02-08 MED ORDER — IBUPROFEN 800 MG PO TABS
800.0000 mg | ORAL_TABLET | Freq: Three times a day (TID) | ORAL | 0 refills | Status: DC | PRN
Start: 2020-02-08 — End: 2020-04-11

## 2020-02-08 MED ORDER — GABAPENTIN 300 MG PO CAPS
ORAL_CAPSULE | ORAL | Status: AC
  Filled 2020-02-08: qty 1

## 2020-02-08 MED ORDER — ROCURONIUM BROMIDE 10 MG/ML (PF) SYRINGE
PREFILLED_SYRINGE | INTRAVENOUS | Status: DC | PRN
  Administered 2020-02-08: 70 mg via INTRAVENOUS
  Administered 2020-02-08: 10 mg via INTRAVENOUS

## 2020-02-08 MED ORDER — DEXAMETHASONE SODIUM PHOSPHATE 10 MG/ML IJ SOLN
INTRAMUSCULAR | Status: DC | PRN
  Administered 2020-02-08: 5 mg via INTRAVENOUS

## 2020-02-08 MED ORDER — ROCURONIUM BROMIDE 10 MG/ML (PF) SYRINGE
PREFILLED_SYRINGE | INTRAVENOUS | Status: AC
  Filled 2020-02-08: qty 10

## 2020-02-08 MED ORDER — BUPIVACAINE LIPOSOME 1.3 % IJ SUSP: 20.0000 mL | Freq: Once | INTRAMUSCULAR | Status: DC

## 2020-02-08 MED ORDER — ACETAMINOPHEN 500 MG PO TABS
1000.0000 mg | ORAL_TABLET | ORAL | Status: AC
  Administered 2020-02-08: 07:00:00 1000 mg via ORAL

## 2020-02-08 MED ORDER — MIDAZOLAM HCL 5 MG/5ML IJ SOLN
INTRAMUSCULAR | Status: DC | PRN
  Administered 2020-02-08: 2 mg via INTRAVENOUS

## 2020-02-08 MED ORDER — BUPIVACAINE HCL 0.5 % IJ SOLN
INTRAMUSCULAR | Status: DC | PRN
  Administered 2020-02-08: 30 mL

## 2020-02-08 MED ORDER — CEFAZOLIN SODIUM-DEXTROSE 2-4 GM/100ML-% IV SOLN
INTRAVENOUS | Status: AC
  Filled 2020-02-08: qty 100

## 2020-02-08 MED ORDER — MIDAZOLAM HCL 2 MG/2ML IJ SOLN
INTRAMUSCULAR | Status: AC
  Filled 2020-02-08: qty 2

## 2020-02-08 MED ORDER — SUGAMMADEX SODIUM 200 MG/2ML IV SOLN
INTRAVENOUS | Status: DC | PRN
  Administered 2020-02-08: 200 mg via INTRAVENOUS

## 2020-02-08 MED ORDER — PROPOFOL 10 MG/ML IV BOLUS
INTRAVENOUS | Status: AC
  Filled 2020-02-08: qty 40

## 2020-02-08 MED ORDER — FENTANYL CITRATE (PF) 250 MCG/5ML IJ SOLN
INTRAMUSCULAR | Status: AC
  Filled 2020-02-08: qty 5

## 2020-02-08 MED ORDER — KETOROLAC TROMETHAMINE 15 MG/ML IJ SOLN
15.0000 mg | INTRAMUSCULAR | Status: AC
  Administered 2020-02-08: 15 mg via INTRAVENOUS

## 2020-02-08 MED ORDER — WHITE PETROLATUM EX OINT
TOPICAL_OINTMENT | CUTANEOUS | Status: AC
  Filled 2020-02-08: qty 5

## 2020-02-08 MED ORDER — BUPIVACAINE LIPOSOME 1.3 % IJ SUSP
INTRAMUSCULAR | Status: DC | PRN
  Administered 2020-02-08: 20 mL

## 2020-02-08 MED ORDER — HYDROCODONE-ACETAMINOPHEN 5-325 MG PO TABS: 1.0000 | ORAL_TABLET | Freq: Four times a day (QID) | ORAL | 0 refills | Status: DC | PRN

## 2020-02-08 MED ORDER — LACTATED RINGERS IV SOLN: INTRAVENOUS | Status: DC

## 2020-02-08 MED ORDER — PROPOFOL 10 MG/ML IV BOLUS
INTRAVENOUS | Status: DC | PRN
  Administered 2020-02-08: 120 mg via INTRAVENOUS

## 2020-02-08 MED ORDER — LIDOCAINE 2% (20 MG/ML) 5 ML SYRINGE
INTRAMUSCULAR | Status: AC
  Filled 2020-02-08: qty 5

## 2020-02-08 SURGICAL SUPPLY — 40 items
ADH SKN CLS APL DERMABOND .7 (GAUZE/BANDAGES/DRESSINGS) ×1
APL PRP STRL LF DISP 70% ISPRP (MISCELLANEOUS) ×1
APPLIER CLIP 5 13 M/L LIGAMAX5 (MISCELLANEOUS)
APR CLP MED LRG 5 ANG JAW (MISCELLANEOUS)
BINDER ABDOMINAL 12 ML 46-62 (SOFTGOODS) ×4 IMPLANT
BLADE CLIPPER SENSICLIP SURGIC (BLADE) ×1 IMPLANT
CABLE HIGH FREQUENCY MONO STRZ (ELECTRODE) ×2 IMPLANT
CHLORAPREP W/TINT 26 (MISCELLANEOUS) ×2 IMPLANT
CLIP APPLIE 5 13 M/L LIGAMAX5 (MISCELLANEOUS) IMPLANT
COVER WAND RF STERILE (DRAPES) ×2 IMPLANT
DERMABOND ADVANCED (GAUZE/BANDAGES/DRESSINGS) ×1
DERMABOND ADVANCED .7 DNX12 (GAUZE/BANDAGES/DRESSINGS) ×1 IMPLANT
DEVICE SECURE STRAP 25 ABSORB (INSTRUMENTS) ×1 IMPLANT
DRAIN CHANNEL 19F RND (DRAIN) IMPLANT
ELECT REM PT RETURN 9FT ADLT (ELECTROSURGICAL) ×2
ELECTRODE REM PT RTRN 9FT ADLT (ELECTROSURGICAL) ×1 IMPLANT
EVACUATOR SILICONE 100CC (DRAIN) IMPLANT
GLOVE BIOGEL PI IND STRL 7.0 (GLOVE) ×1 IMPLANT
GLOVE BIOGEL PI INDICATOR 7.0 (GLOVE) ×1
GLOVE SURG SS PI 7.0 STRL IVOR (GLOVE) ×2 IMPLANT
GOWN STRL REUS W/TWL LRG LVL3 (GOWN DISPOSABLE) ×2 IMPLANT
GRASPER SUT TROCAR 14GX15 (MISCELLANEOUS) ×2 IMPLANT
IRRIG SUCT STRYKERFLOW 2 WTIP (MISCELLANEOUS)
IRRIGATION SUCT STRKRFLW 2 WTP (MISCELLANEOUS) IMPLANT
MESH VENTRALIGHT ST 6X8 (Mesh Specialty) ×2 IMPLANT
MESH VENTRLGHT ELLIPSE 8X6XMFL (Mesh Specialty) IMPLANT
SCISSORS LAP 5X35 DISP (ENDOMECHANICALS) ×2 IMPLANT
SET BASIN DAY SURGERY F.S. (CUSTOM PROCEDURE TRAY) ×2 IMPLANT
SET TUBE SMOKE EVAC HIGH FLOW (TUBING) ×2 IMPLANT
SUT ETHILON 2 0 PS N (SUTURE) IMPLANT
SUT MNCRL AB 4-0 PS2 18 (SUTURE) ×2 IMPLANT
SUT NOVA NAB GS-21 0 18 T12 DT (SUTURE) ×4 IMPLANT
SUT PDS AB 0 CT 36 (SUTURE) ×3 IMPLANT
SUT VICRYL 0 UR6 27IN ABS (SUTURE) ×1 IMPLANT
TOWEL OR 17X26 10 PK STRL BLUE (TOWEL DISPOSABLE) ×3 IMPLANT
TRAY FOL W/BAG SLVR 16FR STRL (SET/KITS/TRAYS/PACK) IMPLANT
TRAY FOLEY W/BAG SLVR 16FR LF (SET/KITS/TRAYS/PACK) ×2
TRAY LAPAROSCOPIC (CUSTOM PROCEDURE TRAY) ×2 IMPLANT
TROCAR BLADELESS OPT 12M 100M (ENDOMECHANICALS) ×2 IMPLANT
TROCAR BLADELESS OPT 5 100 (ENDOMECHANICALS) ×4 IMPLANT

## 2020-02-08 NOTE — Anesthesia Procedure Notes (Signed)
Procedure Name: Intubation Date/Time: 02/08/2020 9:02 AM Performed by: Lollie Sails, CRNA Pre-anesthesia Checklist: Patient identified, Emergency Drugs available, Suction available, Patient being monitored and Timeout performed Patient Re-evaluated:Patient Re-evaluated prior to induction Oxygen Delivery Method: Circle system utilized Preoxygenation: Pre-oxygenation with 100% oxygen Induction Type: IV induction Ventilation: Mask ventilation without difficulty and Oral airway inserted - appropriate to patient size Laryngoscope Size: Miller and 3 Grade View: Grade II Tube type: Oral Tube size: 7.5 mm Number of attempts: 1 Airway Equipment and Method: Stylet Placement Confirmation: ETT inserted through vocal cords under direct vision,  positive ETCO2 and breath sounds checked- equal and bilateral Secured at: 23 cm Tube secured with: Tape Dental Injury: Teeth and Oropharynx as per pre-operative assessment

## 2020-02-08 NOTE — H&P (Signed)
Jacob Blanchard is an 64 y.o. male.   Chief Complaint: hernia HPI: 64 yo male with symptomatic periumbilical incisional hernia. He has intermittent pain and frequent small bowel sounds with reduction or positioning.  Past Medical History:  Diagnosis Date  . Anxiety    prn xanax started 01/2019  . Barrett's esophagus    Bx+ 2007, 2009, and 2020.  Marland Kitchen Chronic constipation    GI->Linzess trial 05/2019->helpful  . Contact dermatitis and other eczema, due to unspecified cause    +scarring  . Epididymal cyst 06/2018   Bilat: small on R, but 4 cm on left  Asymptomatic.  Marland Kitchen GERD (gastroesophageal reflux disease)   . History of adenomatous polyp of colon 08/10/2006   2007 and 12/2018; recall 12/2019.  Marland Kitchen Hyperlipidemia   . Immunization series complete    has had both doses pfizer covid 19 vaccine  . Incisional hernia   . Left foot pain    ? Arch strain.  Some numbness to suggest possible tarsal tunnel syndrome vs lumbar radiculitis (sports med MD).  Responded very well to custom orthotic.  . Migraine syndrome    verapamil prophylaxis, cluster migraine  . Seasonal and perennial allergic rhinitis    vaccine/injection therapy helpful  . Small bowel obstruction ([place]) Fall of 2019   surgery->lysis of adhesions  . Vertebral compression fracture ([place]) 05/2019   T11 and T12 and L 1, minimal trauma.  Dr. Darene Lamer put him on fosamax.    Past Surgical History:  Procedure Laterality Date  . ABDOMINAL EXPLORATION SURGERY  Fall 2019   SBO->lysis of adhesions  . COLONOSCOPY W/ POLYPECTOMY  08/10/06; 12/2018   Several adenomatous polyps 2007 and 2020; 01-25-2020  . DEXA  05/24/2019   Dr. Bartholome Bill -2.4 + vert comp fx->fosamax started.  . ESOPHAGOGASTRODUODENOSCOPY  2007 and 07/20/08;12/2018   GERD, BARRET's esoph (Dr. Henrene Pastor). Same 12/2018.  Marland Kitchen LE venous doppler u/s  04/19/15   L leg NORMAL  . right knee arthroscopy  1980s   Torn meniscus (Dr. Noemi Chapel)    Family History  Problem Relation Age of Onset  . Other  Father        chemical PNA siphoning gas  . Colon cancer Neg Hx   . Esophageal cancer Neg Hx   . Rectal cancer Neg Hx   . Stomach cancer Neg Hx    Social History:  reports that he quit smoking about 26 years ago. His smoking use included cigarettes. He has a 10.00 pack-year smoking history. He has never used smokeless tobacco. He reports current alcohol use. He reports that he does not use drugs.  Allergies:  Allergies  Allergen Reactions  . Oxycodone     nausea    Medications Prior to Admission  Medication Sig Dispense Refill  . alendronate (FOSAMAX) 70 MG tablet Take 1 tablet (70 mg total) by mouth every 7 (seven) days. (Patient taking differently: Take 70 mg by mouth every 7 (seven) days. Tuesday am) 4 tablet 11  . ALPRAZolam (XANAX) 0.5 MG tablet Take 1/2-1 tab po bid prn anxiety 60 tablet 1  . atorvastatin (LIPITOR) 40 MG tablet TAKE 1 TABLET BY MOUTH EVERY DAY (Patient taking differently: every evening. ) 30 tablet 0  . Calcium Carbonate-Vitamin D 600-400 MG-UNIT tablet Take 1 tablet by mouth 2 (two) times daily. (Patient taking differently: Take 1 tablet by mouth daily. ) 60 tablet 11  . cyclobenzaprine (FLEXERIL) 10 MG tablet TAKE 1 TABLET (10 MG TOTAL) BY MOUTH 2 (TWO) TIMES DAILY AS  NEEDED. (Patient taking differently: Take 10 mg by mouth as needed. Takes 1/4 to 1/4 qod) 30 tablet 3  . fluticasone (FLONASE) 50 MCG/ACT nasal spray Place 2 sprays into both nostrils daily. 16 g 11  . Ibuprofen (ADVIL) 200 MG CAPS Take by mouth.      . lansoprazole (PREVACID) 30 MG capsule TAKE 1 CAPSULE BY MOUTH TWICE A DAY (Patient taking differently: 2 (two) times daily before a meal. ) 60 capsule 0  . linaclotide (LINZESS) 290 MCG CAPS capsule TAKE 1 CAPSULE (290 MCG TOTAL) BY MOUTH DAILY BEFORE BREAKFAST. (Patient taking differently: TAKE 1 CAPSULE (290 MCG TOTAL) BY MOUTH DAILY in pm) 90 capsule 1  . verapamil (CALAN) 120 MG tablet Take 120 mg by mouth 2 (two) times daily. For migraines       Results for orders placed or performed during the hospital encounter of 02/08/20 (from the past 48 hour(s))  I-STAT, chem 8     Status: Abnormal   Collection Time: 02/08/20  7:07 AM  Result Value Ref Range   Sodium 139 135 - 145 mmol/L   Potassium 3.9 3.5 - 5.1 mmol/L   Chloride 102 98 - 111 mmol/L   BUN 9 8 - 23 mg/dL   Creatinine, Ser 0.90 0.61 - 1.24 mg/dL   Glucose, Bld 86 70 - 99 mg/dL    Comment: Glucose reference range applies only to samples taken after fasting for at least 8 hours.   Calcium, Ion 1.09 (L) 1.15 - 1.40 mmol/L   TCO2 26 22 - 32 mmol/L   Hemoglobin 16.0 13.0 - 17.0 g/dL   HCT 47.0 39.0 - 52.0 %   No results found.  Review of Systems  Constitutional: Negative for chills and fever.  HENT: Negative for hearing loss.   Respiratory: Negative for cough.   Cardiovascular: Negative for chest pain and palpitations.  Gastrointestinal: Negative for abdominal pain, nausea and vomiting.  Genitourinary: Negative for dysuria and urgency.  Musculoskeletal: Negative for myalgias and neck pain.  Skin: Negative for rash.  Neurological: Negative for dizziness and headaches.  Hematological: Does not bruise/bleed easily.  Psychiatric/Behavioral: Negative for suicidal ideas.    Blood pressure (!) 136/91, pulse 93, temperature 97.6 F (36.4 C), temperature source Oral, resp. rate 16, height 5\' 10"  (1.778 m), weight 67.3 kg, SpO2 100 %. Physical Exam  Vitals reviewed. Constitutional: He is oriented to person, place, and time. He appears well-developed and well-nourished.  HENT:  Head: Normocephalic and atraumatic.  Eyes: Pupils are equal, round, and reactive to light. Conjunctivae and EOM are normal.  Cardiovascular: Normal rate and regular rhythm.  Respiratory: Effort normal and breath sounds normal.  GI: Soft. Bowel sounds are normal. He exhibits no distension. There is no abdominal tenderness.  Musculoskeletal:        General: Normal range of motion.     Cervical  back: Normal range of motion and neck supple.  Neurological: He is alert and oriented to person, place, and time.  Skin: Skin is warm and dry.  Psychiatric: He has a normal mood and affect. His behavior is normal.     Assessment/Plan 64 yo male with incisional periumbilical hernia -lap incisional hernia repair with mesh -planned outpatient procedure -[facility] protocols  Mickeal Skinner, MD 02/08/2020, 8:22 AM

## 2020-02-08 NOTE — Op Note (Signed)
Preoperative diagnosis: incisional hernia without obstruction or gangrene  Postoperative diagnosis: Same   Procedure: laparoscopic incisional hernia repair with placement of underlay mesh  Surgeon: Gurney Maxin, M.D.  Asst: none  Anesthesia: Gen.   Indications for procedure: Jacob Blanchard is a 64 y.o. male with symptoms of abdominal pain and hernia reducible on exam.  Description of procedure: The patient was brought into the operative suite, placed supine. Anesthesia was administered with endotracheal tube. Patient was strapped in place and foot board was secured. Both arms were tucked. All pressure points were offloaded by foam padding. The patient was prepped and draped in the usual sterile fashion.  A small incision was made over the left subcostal area and 69mm trocar was placed with optical entry. Pneumoperitoneum was applied with high flow low pressure. The abdominal cavity was inspected and there was a 4 cm periumbilical hernia which was completely reduced. 1 17mm trocar was placed in the mid left abdomen and 3 additional 46mm trocars 1 in the LLQ. Bilateral TAP was placed with marcaine/exparel.  Blunt dissection was used to remove most of the filmy adhesions with occasional sharp dissection. Cautery was used to provide hemostasis. The hernia was identified and was reduced. The falciform was taken down exposing one other small 1-2 cm hernia. 0 PDS was used in vertical orientation to close the defects with 3 sutures in each hernia. A 15 x 20 cm ventralight mesh was inserted and placed beneath the fascia and peritoneum to the repair the mesh. 8 transfascial 0 novafil sutures were used to secure the mesh in place and absorbable tackers were used to appose the mesh against the abdominal wall in all areas.  The abdominal contents were again inspected and hemostasis was intact.  0 vicryl was used to close the fascial defect of the 26mm trocar site using suture passer. Pneumoperitoneum was  removed, all trocar were removed. All incisions were closed with 4-0 monocryl subcuticular stitch. The patient woke from anesthesia and was brought to PACU in stable condition.  Findings: 4 cm periumbilical incisional hernia and 1 cm supraumbilical incisional hernia  Specimen: none  Blood loss: 10 ml  Local anesthesia: 50 ml [id number] Marcaine  Complications: none  Implant: 15 x 20 cm ventralight ST mesh  Images:     Gurney Maxin, M.D. General, Bariatric, & Minimally Invasive Surgery [facility]

## 2020-02-08 NOTE — Anesthesia Preprocedure Evaluation (Addendum)
Anesthesia Evaluation  Patient identified by MRN, date of birth, ID band Patient awake    Reviewed: Allergy & Precautions, NPO status , Patient's Chart, lab work & pertinent test results  Airway Mallampati: I  TM Distance: >3 FB Neck ROM: Full    Dental no notable dental hx. (+) Teeth Intact, Dental Advisory Given   Pulmonary asthma , former smoker,    Pulmonary exam normal breath sounds clear to auscultation       Cardiovascular Normal cardiovascular exam Rhythm:Regular Rate:Normal  HLD   Neuro/Psych  Headaches, PSYCHIATRIC DISORDERS Anxiety    GI/Hepatic Neg liver ROS, GERD  Medicated and Controlled,H/o barrett's esophagus   Endo/Other  negative endocrine ROS  Renal/GU negative Renal ROS  negative genitourinary   Musculoskeletal  (+) Arthritis ,   Abdominal   Peds  Hematology negative hematology ROS (+)   Anesthesia Other Findings   Reproductive/Obstetrics                           Anesthesia Physical Anesthesia Plan  ASA: II  Anesthesia Plan: General   Post-op Pain Management:    Induction: Intravenous  PONV Risk Score and Plan: 2 and Ondansetron, Dexamethasone and Midazolam  Airway Management Planned: Oral ETT  Additional Equipment:   Intra-op Plan:   Post-operative Plan: Extubation in OR  Informed Consent: I have reviewed the patients History and Physical, chart, labs and discussed the procedure including the risks, benefits and alternatives for the proposed anesthesia with the patient or authorized representative who has indicated his/her understanding and acceptance.     Dental advisory given  Plan Discussed with: CRNA  Anesthesia Plan Comments:        Anesthesia Quick Evaluation

## 2020-02-08 NOTE — Transfer of Care (Signed)
Immediate Anesthesia Transfer of Care Note  Patient: Jacob Blanchard  Procedure(s) Performed: LAPAROSCOPIC INCISIONAL HERNIA REPAIR WITH MESH (N/A Abdomen)  Patient Location: PACU  Anesthesia Type:General  Level of Consciousness: drowsy and responds to stimulation  Airway & Oxygen Therapy: Patient Spontanous Breathing and Patient connected to face mask oxygen  Post-op Assessment: Report given to RN and Post -op Vital signs reviewed and stable  Post vital signs: Reviewed and stable  Last Vitals:  Vitals Value Taken Time  BP 160/101 02/08/20 1035  Temp    Pulse 103 02/08/20 1038  Resp 18 02/08/20 1038  SpO2 98 % 02/08/20 1038  Vitals shown include unvalidated device data.  Last Pain:  Vitals:   02/08/20 0706  TempSrc: Oral  PainSc: 0-No pain      Patients Stated Pain Goal: 5 ([id number])  Complications: No apparent anesthesia complications

## 2020-02-08 NOTE — Discharge Instructions (Signed)
Post Anesthesia Home Care Instructions  Activity: Get plenty of rest for the remainder of the day. A responsible individual must stay with you for 24 hours following the procedure.  For the next 24 hours, DO NOT: -Drive a car -Paediatric nurse -Drink alcoholic beverages -Take any medication unless instructed by your physician -Make any legal decisions or sign important papers.  Meals: Start with liquid foods such as gelatin or soup. Progress to regular foods as tolerated. Avoid greasy, spicy, heavy foods. If nausea and/or vomiting occur, drink only clear liquids until the nausea and/or vomiting subsides. Call your physician if vomiting continues.  Special Instructions/Symptoms: Your throat may feel dry or sore from the anesthesia or the breathing tube placed in your throat during surgery. If this causes discomfort, gargle with warm salt water. The discomfort should disappear within 24 hours.    Diagnostic Laparoscopy, Care After This sheet gives you information about how to care for yourself after your procedure. Your health care provider may also give you more specific instructions. If you have problems or questions, contact your health care provider. What can I expect after the procedure? After the procedure, it is common to have:  Mild discomfort in the abdomen.  Sore throat. Women who have laparoscopy with pelvic examination may have mild cramping and fluid coming from the vagina for a few days after the procedure. Follow these instructions at home: Medicines  Take over-the-counter and prescription medicines only as told by your health care provider.  If you were prescribed an antibiotic medicine, take it as told by your health care provider. Do not stop taking the antibiotic even if you start to feel better. Driving  Do not drive for 24 hours if you were given a medicine to help you relax (sedative) during your procedure.  Do not drive or use heavy machinery while taking  prescription pain medicine. Bathing  Do not take baths, swim, or use a hot tub until your health care provider approves. You may take showers. Incision care   Follow instructions from your health care provider about how to take care of your incisions. Make sure you: ? Wash your hands with soap and water before you change your bandage (dressing). If soap and water are not available, use hand sanitizer. ? Change your dressing as told by your health care provider. ? Leave stitches (sutures), skin glue, or adhesive strips in place. These skin closures may need to stay in place for 2 weeks or longer. If adhesive strip edges start to loosen and curl up, you may trim the loose edges. Do not remove adhesive strips completely unless your health care provider tells you to do that.  Check your incision areas every day for signs of infection. Check for: ? Redness, swelling, or pain. ? Fluid or blood. ? Warmth. ? Pus or a bad smell. Activity  Return to your normal activities as told by your health care provider. Ask your health care provider what activities are safe for you.  Do not lift anything that is heavier than 10 lb (4.5 kg), or the limit that you are told, until your health care provider says that it is safe. General instructions  To prevent or treat constipation while you are taking prescription pain medicine, your health care provider may recommend that you: ? Drink enough fluid to keep your urine pale yellow. ? Take over-the-counter or prescription medicines. ? Eat foods that are high in fiber, such as fresh fruits and vegetables, whole grains, and beans. ?  Limit foods that are high in fat and processed sugars, such as fried and sweet foods.  Do not use any products that contain nicotine or tobacco, such as cigarettes and e-cigarettes. If you need help quitting, ask your health care provider.  Keep all follow-up visits as told by your health care provider. This is important. Contact a  health care provider if:  You develop shoulder pain.  You feel lightheaded or faint.  You are unable to pass gas or have a bowel movement.  You feel nauseous or you vomit.  You develop a rash.  You have redness, swelling, or pain around any incision.  You have fluid or blood coming from any incision.  Any incision feels warm to the touch.  You have pus or a bad smell coming from any incision.  You have a fever or chills. Get help right away if:  You have severe pain.  You have vomiting that does not go away.  You have heavy bleeding from the vagina.  Any incision opens.  You have trouble breathing.  You have chest pain. Summary  After the procedure, it is common to have mild discomfort in the abdomen and a sore throat.  Check your incision areas every day for signs of infection.  Return to your normal activities as told by your health care provider. Ask your health care provider what activities are safe for you. This information is not intended to replace advice given to you by your health care provider. Make sure you discuss any questions you have with your health care provider. Document Revised: 09/03/2017 Document Reviewed: 03/17/2017 Elsevier Patient Education  [place]   Next dose of tylenol after 1:15 pm today if needed.   Information for Discharge Teaching: EXPAREL (bupivacaine liposome injectable suspension)   Your surgeon or anesthesiologist gave you EXPAREL(bupivacaine) to help control your pain after surgery.   EXPAREL is a local anesthetic that provides pain relief by numbing the tissue around the surgical site.  EXPAREL is designed to release pain medication over time and can control pain for up to 72 hours.  Depending on how you respond to EXPAREL, you may require less pain medication during your recovery.  Possible side effects:  Temporary loss of sensation or ability to move in the area where bupivacaine was injected.  Nausea,  vomiting, constipation  Rarely, numbness and tingling in your mouth or lips, lightheadedness, or anxiety may occur.  Call your doctor right away if you think you may be experiencing any of these sensations, or if you have other questions regarding possible side effects.  Follow all other discharge instructions given to you by your surgeon or nurse. Eat a healthy diet and drink plenty of water or other fluids.  If you return to the hospital for any reason within 96 hours following the administration of EXPAREL, it is important for health care providers to know that you have received this anesthetic. A teal colored band has been placed on your arm with the date, time and amount of EXPAREL you have received in order to alert and inform your health care providers. Please leave this armband in place for the full 96 hours following administration, and then you may remove the band.  May remove green armband Monday, Feb 12, 2020.

## 2020-02-12 NOTE — Anesthesia Postprocedure Evaluation (Signed)
Anesthesia Post Note  Patient: Jacob Blanchard  Procedure(s) Performed: LAPAROSCOPIC INCISIONAL HERNIA REPAIR WITH MESH (N/A Abdomen)     Patient location during evaluation: PACU Anesthesia Type: General Level of consciousness: awake and alert Pain management: pain level controlled Vital Signs Assessment: post-procedure vital signs reviewed and stable Respiratory status: spontaneous breathing, nonlabored ventilation, respiratory function stable and patient connected to nasal cannula oxygen Cardiovascular status: blood pressure returned to baseline and stable Postop Assessment: no apparent nausea or vomiting Anesthetic complications: no    Last Vitals:  Vitals:   02/08/20 1115 02/08/20 1249  BP: (!) 138/93 (!) 154/98  Pulse: 84 69  Resp: 12 12  Temp:  (!) 36.1 C  SpO2: 99% 98%    Last Pain:  Vitals:   02/09/20 1051  TempSrc:   PainSc: 3                  Jai Steil L Diamonique Ruedas

## 2020-02-21 ENCOUNTER — Other Ambulatory Visit: Payer: Self-pay | Admitting: Family Medicine

## 2020-03-05 DIAGNOSIS — M50321 Other cervical disc degeneration at C4-C5 level: Secondary | ICD-10-CM | POA: Diagnosis not present

## 2020-03-05 DIAGNOSIS — M50322 Other cervical disc degeneration at C5-C6 level: Secondary | ICD-10-CM | POA: Diagnosis not present

## 2020-03-05 DIAGNOSIS — M9901 Segmental and somatic dysfunction of cervical region: Secondary | ICD-10-CM | POA: Diagnosis not present

## 2020-03-05 DIAGNOSIS — M50323 Other cervical disc degeneration at C6-C7 level: Secondary | ICD-10-CM | POA: Diagnosis not present

## 2020-03-09 ENCOUNTER — Other Ambulatory Visit: Payer: Self-pay | Admitting: Sports Medicine

## 2020-03-09 DIAGNOSIS — M8000XD Age-related osteoporosis with current pathological fracture, unspecified site, subsequent encounter for fracture with routine healing: Secondary | ICD-10-CM

## 2020-03-15 ENCOUNTER — Other Ambulatory Visit: Payer: Self-pay | Admitting: Family Medicine

## 2020-04-02 DIAGNOSIS — M9901 Segmental and somatic dysfunction of cervical region: Secondary | ICD-10-CM | POA: Diagnosis not present

## 2020-04-02 DIAGNOSIS — M50323 Other cervical disc degeneration at C6-C7 level: Secondary | ICD-10-CM | POA: Diagnosis not present

## 2020-04-02 DIAGNOSIS — M50321 Other cervical disc degeneration at C4-C5 level: Secondary | ICD-10-CM | POA: Diagnosis not present

## 2020-04-02 DIAGNOSIS — M50322 Other cervical disc degeneration at C5-C6 level: Secondary | ICD-10-CM | POA: Diagnosis not present

## 2020-04-11 ENCOUNTER — Encounter: Payer: Self-pay | Admitting: Emergency Medicine

## 2020-04-11 ENCOUNTER — Emergency Department (INDEPENDENT_AMBULATORY_CARE_PROVIDER_SITE_OTHER)
Admission: EM | Admit: 2020-04-11 | Discharge: 2020-04-11 | Disposition: A | Discharge status: Home / Self Care | Payer: BC Managed Care – PPO | Source: Home / Self Care

## 2020-04-11 ENCOUNTER — Emergency Department (INDEPENDENT_AMBULATORY_CARE_PROVIDER_SITE_OTHER): Payer: BC Managed Care – PPO

## 2020-04-11 ENCOUNTER — Other Ambulatory Visit: Payer: Self-pay

## 2020-04-11 DIAGNOSIS — M25571 Pain in right ankle and joints of right foot: Secondary | ICD-10-CM | POA: Diagnosis not present

## 2020-04-11 DIAGNOSIS — S93601A Unspecified sprain of right foot, initial encounter: Secondary | ICD-10-CM | POA: Diagnosis not present

## 2020-04-11 DIAGNOSIS — M79671 Pain in right foot: Secondary | ICD-10-CM | POA: Diagnosis not present

## 2020-04-11 DIAGNOSIS — S99921A Unspecified injury of right foot, initial encounter: Secondary | ICD-10-CM | POA: Diagnosis not present

## 2020-04-11 DIAGNOSIS — S93401A Sprain of unspecified ligament of right ankle, initial encounter: Secondary | ICD-10-CM

## 2020-04-11 DIAGNOSIS — W1840XA Slipping, tripping and stumbling without falling, unspecified, initial encounter: Secondary | ICD-10-CM

## 2020-04-11 DIAGNOSIS — M7989 Other specified soft tissue disorders: Secondary | ICD-10-CM | POA: Diagnosis not present

## 2020-04-11 DIAGNOSIS — M25471 Effusion, right ankle: Secondary | ICD-10-CM | POA: Diagnosis not present

## 2020-04-11 MED ORDER — IBUPROFEN 400 MG PO TABS: 400.0000 mg | ORAL_TABLET | Freq: Three times a day (TID) | ORAL | 0 refills | Status: DC | PRN

## 2020-04-11 NOTE — ED Provider Notes (Signed)
[name]    CSN: [id number] Arrival date & time: 04/11/20  1518      History   Chief Complaint Chief Complaint  Patient presents with  . Ankle Injury    right  . Ankle Pain    right    HPI Jacob Blanchard is a 64 y.o. male.   HPI Jacob Blanchard is a 64 y.o. male presenting to UC with c/o Right ankle and foot pain that started around 0730 this morning, hyperextending it tripping down steps to his garage. Pain is sharp and throbbing, worse with weight bearing.  He took ibuprofen 400mg  at 1400 with mild relief. No hx of prior fractures in same ankle or foot.    Past Medical History:  Diagnosis Date  . Anxiety    prn xanax started 01/2019  . Barrett's esophagus    Bx+ 2007, 2009, and 2020.  Marland Kitchen Chronic constipation    GI->Linzess trial 05/2019->helpful  . Contact dermatitis and other eczema, due to unspecified cause    +scarring  . Epididymal cyst 06/2018   Bilat: small on R, but 4 cm on left  Asymptomatic.  Marland Kitchen GERD (gastroesophageal reflux disease)   . History of adenomatous polyp of colon 08/10/2006   2007 and 12/2018; recall 12/2019.  Marland Kitchen Hyperlipidemia   . Immunization series complete    has had both doses pfizer covid 19 vaccine  . Incisional hernia   . Left foot pain    ? Arch strain.  Some numbness to suggest possible tarsal tunnel syndrome vs lumbar radiculitis (sports med MD).  Responded very well to custom orthotic.  . Migraine syndrome    verapamil prophylaxis, cluster migraine  . Seasonal and perennial allergic rhinitis    vaccine/injection therapy helpful  . Small bowel obstruction ([place]) Fall of 2019   surgery->lysis of adhesions  . Vertebral compression fracture ([place]) 05/2019   T11 and T12 and L 1, minimal trauma.  Dr. Darene Lamer put him on fosamax.    Patient Active Problem List   Diagnosis Date Noted  . Personal history of colonic polyps 12/14/2019  . Umbilical hernia without obstruction and without gangrene 12/14/2019  . Osteoporosis 05/24/2019   . Lumbar spondylosis 05/12/2019  . Thoracic compression fracture ([place]) 05/12/2019  . Chronic constipation 05/12/2019  . Left foot pain 01/21/2017  . Maxillary sinusitis 07/19/2015  . Chronic asthma 10/03/2012  . Seasonal and perennial allergic rhinitis 06/05/2008  . GERD 06/05/2008  . Eczema 06/05/2008  . Fatigue 02/21/2008    Past Surgical History:  Procedure Laterality Date  . ABDOMINAL EXPLORATION SURGERY  Fall 2019   SBO->lysis of adhesions  . COLONOSCOPY W/ POLYPECTOMY  08/10/06; 12/2018   Several adenomatous polyps 2007 and 2020; 01-25-2020  . DEXA  05/24/2019   Dr. Bartholome Bill -2.4 + vert comp fx->fosamax started.  . ESOPHAGOGASTRODUODENOSCOPY  2007 and 07/20/08;12/2018   GERD, BARRET's esoph (Dr. Henrene Pastor). Same 12/2018.  Marland Kitchen INCISIONAL HERNIA REPAIR N/A 02/08/2020   Procedure: LAPAROSCOPIC INCISIONAL HERNIA REPAIR WITH MESH;  Surgeon: Kinsinger, Arta Bruce, MD;  Location: [facility];  Service: General;  Laterality: N/A;  . LE venous doppler u/s  04/19/15   L leg NORMAL  . right knee arthroscopy  1980s   Torn meniscus (Dr. Noemi Chapel)       Home Medications    Prior to Admission medications   Medication Sig Start Date End Date Taking? Authorizing Provider  atorvastatin (LIPITOR) 40 MG tablet TAKE 1 TABLET BY MOUTH EVERY DAY 02/21/20  Yes McGowen, Adrian Blackwater, MD  Calcium Carbonate-Vitamin D 600-400 MG-UNIT tablet Take 1 tablet by mouth 2 (two) times daily. Patient taking differently: Take 1 tablet by mouth daily.  06/23/19  Yes Silverio Decamp, MD  cyclobenzaprine (FLEXERIL) 10 MG tablet TAKE 1 TABLET (10 MG TOTAL) BY MOUTH 2 (TWO) TIMES DAILY AS NEEDED. Patient taking differently: Take 10 mg by mouth as needed. Takes 1/4 to 1/4 qod 03/22/19  Yes McGowen, Adrian Blackwater, MD  fluticasone (FLONASE) 50 MCG/ACT nasal spray Place 2 sprays into both nostrils daily. 06/23/18  Yes McGowen, Adrian Blackwater, MD  lansoprazole (PREVACID) 30 MG capsule TAKE 1 CAPSULE BY MOUTH TWICE A DAY  02/22/20  Yes McGowen, Adrian Blackwater, MD  linaclotide (LINZESS) 290 MCG CAPS capsule TAKE 1 CAPSULE (290 MCG TOTAL) BY MOUTH DAILY BEFORE BREAKFAST. Patient taking differently: TAKE 1 CAPSULE (290 MCG TOTAL) BY MOUTH DAILY in pm 12/12/19  Yes Zehr, Janett Billow D, PA-C  verapamil (CALAN) 120 MG tablet Take 120 mg by mouth 2 (two) times daily. For migraines 06/19/19  Yes [provider]  alendronate (FOSAMAX) 70 MG tablet TAKE 1 TABLET (70 MG TOTAL) BY MOUTH EVERY 7 (SEVEN) DAYS. 03/11/20   Silverio Decamp, MD  ALPRAZolam Duanne Moron) 0.5 MG tablet Take 1/2-1 tab po bid prn anxiety 06/29/19   McGowen, Adrian Blackwater, MD  HYDROcodone-acetaminophen (NORCO/VICODIN) 5-325 MG tablet Take 1 tablet by mouth every 6 (six) hours as needed for moderate pain. 02/08/20   Kinsinger, Arta Bruce, MD  ibuprofen (ADVIL) 400 MG tablet Take 1-2 tablets (400-800 mg total) by mouth every 8 (eight) hours as needed. 04/11/20   Noe Gens, PA-C    Family History Family History  Problem Relation Age of Onset  . Other Father        chemical PNA siphoning gas  . Healthy Brother   . Colon cancer Neg Hx   . Esophageal cancer Neg Hx   . Rectal cancer Neg Hx   . Stomach cancer Neg Hx     Social History Social History   Tobacco Use  . Smoking status: Former Smoker    Packs/day: 1.00    Years: 10.00    Pack years: 10.00    Types: Cigarettes    Quit date: 07/06/1993    Years since quitting: 26.7  . Smokeless tobacco: Never Used  Vaping Use  . Vaping Use: Never used  Substance Use Topics  . Alcohol use: Yes    Comment: 1 or 2 daily  . Drug use: No     Allergies   Oxycodone   Review of Systems Review of Systems  Musculoskeletal: Positive for arthralgias and gait problem.  Skin: Negative for color change and wound.  Neurological: Negative for weakness and numbness.     Physical Exam Triage Vital Signs ED Triage Vitals  Enc Vitals Group     BP 04/11/20 1534 (!) 147/92     Pulse Rate 04/11/20 1534 98      Resp 04/11/20 1534 15     Temp 04/11/20 1534 97.7 F (36.5 C)     Temp Source 04/11/20 1534 Oral     SpO2 04/11/20 1534 98 %     Weight 04/11/20 1536 155 lb (70.3 kg)     Height 04/11/20 1536 5\' 11"  (1.803 m)     Head Circumference --      Peak Flow --      Pain Score 04/11/20 1535 5     Pain Loc --  Pain Edu? --      Excl. in [place]? --    No data found.  Updated Vital Signs BP (!) 147/92 (BP Location: Left Arm)   Pulse 98   Temp 97.7 F (36.5 C) (Oral)   Resp 15   Ht 5\' 11"  (1.803 m)   Wt 155 lb (70.3 kg)   SpO2 98%   BMI 21.62 kg/m   Visual Acuity Right Eye Distance:   Left Eye Distance:   Bilateral Distance:    Right Eye Near:   Left Eye Near:    Bilateral Near:     Physical Exam Vitals and nursing note reviewed.  Constitutional:      Appearance: Normal appearance. He is well-developed.  HENT:     Head: Normocephalic and atraumatic.  Cardiovascular:     Rate and Rhythm: Normal rate.  Pulmonary:     Effort: Pulmonary effort is normal.  Musculoskeletal:        General: Tenderness present. No swelling. Normal range of motion.     Cervical back: Normal range of motion.     Comments: Right ankle and foot: no obvious deformity or edema, tenderness to lateral ankle and dorsum of foot. Full ROM ankle and toes. Calf is soft, non-tender.  Skin:    General: Skin is warm and dry.     Capillary Refill: Capillary refill takes less than 2 seconds.     Findings: No bruising or erythema.  Neurological:     Mental Status: He is alert and oriented to person, place, and time.     Sensory: No sensory deficit.  Psychiatric:        Behavior: Behavior normal.      UC Treatments / Results  Labs (all labs ordered are listed, but only abnormal results are displayed) Labs Reviewed - No data to display  EKG   Radiology DG Ankle Complete Right  Result Date: 04/11/2020 CLINICAL DATA:  Right foot and ankle pain, swelling and tenderness since rolling foot/ankle this  morning walking down steps. EXAM: RIGHT ANKLE - COMPLETE 3+ VIEW COMPARISON:  None. FINDINGS: There is no evidence of fracture or dislocation. Ankle mortise is preserved. There is a small ankle joint effusion. Small plantar calcaneal spur. Soft tissues are unremarkable. IMPRESSION: 1. No fracture or dislocation of the right ankle. 2. Small ankle joint effusion. Electronically Signed   By: Keith Rake M.D.   On: 04/11/2020 16:20   DG Foot Complete Right  Result Date: 04/11/2020 CLINICAL DATA:  Right foot and ankle pain, swelling and tenderness since rolling foot/ankle this morning walking down steps. EXAM: RIGHT FOOT COMPLETE - 3+ VIEW COMPARISON:  None. FINDINGS: There is no evidence of fracture or dislocation. Mild hammertoe deformity of the third and fourth digits. No significant arthropathy. No focal soft tissue abnormality. IMPRESSION: No fracture or subluxation of the right foot. Electronically Signed   By: Keith Rake M.D.   On: 04/11/2020 16:22    Procedures Procedures (including critical care time)  Medications Ordered in UC Medications - No data to display  Initial Impression / Assessment and Plan / UC Course  I have reviewed the triage vital signs and the nursing notes.  Pertinent labs & imaging results that were available during my care of the patient were reviewed by me and considered in my medical decision making (see chart for details).     Reviewed imaging with pt Will tx as sprain Pt declined crutches after trying them in UC F/u Sports Medicine 1-2 weeks  Final Clinical Impressions(s) / UC Diagnoses   Final diagnoses:  Sprain of right ankle, unspecified ligament, initial encounter  Right foot sprain, initial encounter     Discharge Instructions      You may take 500mg  acetaminophen every 4-6 hours or in combination with ibuprofen 400-800mg  every 8 hours as needed for pain and inflammation.  Call to schedule a follow up appointment with Sports Medicine in  1-2 weeks if not improving.    ED Prescriptions    Medication Sig Dispense Auth. Provider   ibuprofen (ADVIL) 400 MG tablet Take 1-2 tablets (400-800 mg total) by mouth every 8 (eight) hours as needed. 30 tablet Noe Gens, [place]     I have reviewed the PDMP during this encounter.   Noe Gens, [place] 04/13/20 [phone]

## 2020-04-11 NOTE — ED Triage Notes (Addendum)
Rolled or hyperextended his R foot/ankle at 0730 going down the steps to his garage  Sharp & throbbing pain to R ankle since then - pt wearing compression socks-  Ibuprofen 400mg  at 1400 No ice or elevation  COVID vaccine Pfizer March 2021

## 2020-04-11 NOTE — Discharge Instructions (Signed)
  You may take 500mg  acetaminophen every 4-6 hours or in combination with ibuprofen 400-800mg  every 8 hours as needed for pain and inflammation.  Call to schedule a follow up appointment with Sports Medicine in 1-2 weeks if not improving.

## 2020-04-16 ENCOUNTER — Encounter: Payer: Self-pay | Admitting: Family Medicine

## 2020-04-17 ENCOUNTER — Other Ambulatory Visit: Payer: Self-pay | Admitting: Family Medicine

## 2020-04-22 ENCOUNTER — Encounter: Payer: Self-pay | Admitting: Family Medicine

## 2020-04-22 ENCOUNTER — Other Ambulatory Visit: Payer: Self-pay

## 2020-04-22 ENCOUNTER — Telehealth (INDEPENDENT_AMBULATORY_CARE_PROVIDER_SITE_OTHER): Payer: BC Managed Care – PPO | Admitting: Family Medicine

## 2020-04-22 VITALS — Temp 97.7°F | Ht 71.0 in | Wt 155.0 lb

## 2020-04-22 DIAGNOSIS — E7800 Pure hypercholesterolemia, unspecified: Secondary | ICD-10-CM | POA: Diagnosis not present

## 2020-04-22 DIAGNOSIS — F4322 Adjustment disorder with anxiety: Secondary | ICD-10-CM

## 2020-04-22 DIAGNOSIS — F411 Generalized anxiety disorder: Secondary | ICD-10-CM

## 2020-04-22 MED ORDER — ATORVASTATIN CALCIUM 40 MG PO TABS: 40.0000 mg | ORAL_TABLET | Freq: Every day | ORAL | 3 refills | Status: DC

## 2020-04-22 MED ORDER — FLUTICASONE PROPIONATE 50 MCG/ACT NA SUSP: 2.0000 | Freq: Every day | NASAL | 11 refills | Status: DC

## 2020-04-22 MED ORDER — CYCLOBENZAPRINE HCL 10 MG PO TABS: 10.0000 mg | ORAL_TABLET | Freq: Two times a day (BID) | ORAL | 3 refills | Status: DC | PRN

## 2020-04-22 NOTE — Progress Notes (Signed)
Virtual Visit via Video Note  I connected with pt on 04/22/20 at 10:30 AM EDT by a video enabled telemedicine application and verified that I am speaking with the correct person using two identifiers.  Location patient: home Location provider:work or home office Persons participating in the virtual visit: patient, provider  I discussed the limitations of evaluation and management by telemedicine and the availability of in person appointments. The patient expressed understanding and agreed to proceed.  Telemedicine visit is a necessity given the COVID-19 restrictions in place at the current time.  HPI: 64 y/o WM being seen today for f/u GAD, high risk medication use. Hx of GAD with hx of panic attack. Feeling ok. Not taking alpraz anymore.  Doesn't want to be on anything that has potential for addiction. He does still suffer from chronic anxiety but not nearly as bad as when I last saw him or when he started alpraz. Says things getting back to normal in life for him regarding work, this helps a lot! Denies depressed mood.  HLD: tolerating statin.  Diet good.  Activity level good.  Got umb hernia surgery about 2 mo ago, doing pretty well. Sprained ankle recently, this is getting better.  PMP AWARE reviewed today: most recent rx for alprazolam 0.5mg  was filled 05/14/19, # 68, rx by me. No red flags.   ROS: See pertinent positives and negatives per HPI.  Past Medical History:  Diagnosis Date  . Anxiety    prn xanax started 01/2019  . Barrett's esophagus    Bx+ 2007, 2009, and 2020.  Marland Kitchen Chronic constipation    GI->Linzess trial 05/2019->helpful  . Contact dermatitis and other eczema, due to unspecified cause    +scarring  . Epididymal cyst 06/2018   Bilat: small on R, but 4 cm on left  Asymptomatic.  Marland Kitchen GERD (gastroesophageal reflux disease)   . History of adenomatous polyp of colon 08/10/2006; 01/2020   2007 and 12/2018; 01/2020, recall 2026  . Hyperlipidemia   . Immunization series  complete    has had both doses pfizer covid 19 vaccine  . Incisional hernia   . Left foot pain    ? Arch strain.  Some numbness to suggest possible tarsal tunnel syndrome vs lumbar radiculitis (sports med MD).  Responded very well to custom orthotic.  . Migraine syndrome    verapamil prophylaxis, cluster migraine  . Seasonal and perennial allergic rhinitis    vaccine/injection therapy helpful  . Small bowel obstruction ([place]) Fall of 2019   surgery->lysis of adhesions  . Vertebral compression fracture ([place]) 05/2019   T11 and T12 and L 1, minimal trauma.  Dr. Darene Lamer put him on fosamax.    Past Surgical History:  Procedure Laterality Date  . ABDOMINAL EXPLORATION SURGERY  Fall 2019   SBO->lysis of adhesions  . COLONOSCOPY W/ POLYPECTOMY  08/10/06; 12/2018   Several adenomatous polyps 2007 and 2020; 01-25-2020. recall 2026  . DEXA  05/24/2019   Dr. Bartholome Bill -2.4 + vert comp fx->fosamax started.  . ESOPHAGOGASTRODUODENOSCOPY  2007 and 07/20/08;12/2018   GERD, BARRET's esoph (Dr. Henrene Pastor). Same 12/2018.  Marland Kitchen INCISIONAL HERNIA REPAIR N/A 02/08/2020   Procedure: LAPAROSCOPIC INCISIONAL HERNIA REPAIR WITH MESH;  Surgeon: Kinsinger, Arta Bruce, MD;  Location: [facility];  Service: General;  Laterality: N/A;  . LE venous doppler u/s  04/19/15   L leg NORMAL  . right knee arthroscopy  1980s   Torn meniscus (Dr. Noemi Chapel)    Family History  Problem Relation Age of  Onset  . Other Father        chemical PNA siphoning gas  . Healthy Brother   . Colon cancer Neg Hx   . Esophageal cancer Neg Hx   . Rectal cancer Neg Hx   . Stomach cancer Neg Hx      Current Outpatient Medications:  .  alendronate (FOSAMAX) 70 MG tablet, TAKE 1 TABLET (70 MG TOTAL) BY MOUTH EVERY 7 (SEVEN) DAYS., Disp: 12 tablet, Rfl: 3 .  atorvastatin (LIPITOR) 40 MG tablet, TAKE 1 TABLET BY MOUTH EVERY DAY, Disp: 30 tablet, Rfl: 0 .  Calcium Carbonate-Vitamin D 600-400 MG-UNIT tablet, Take 1 tablet by mouth 2 (two)  times daily. (Patient taking differently: Take 1 tablet by mouth daily. ), Disp: 60 tablet, Rfl: 11 .  cyclobenzaprine (FLEXERIL) 10 MG tablet, TAKE 1 TABLET (10 MG TOTAL) BY MOUTH 2 (TWO) TIMES DAILY AS NEEDED. (Patient taking differently: Take 10 mg by mouth as needed. Takes 1/4 to 1/4 qod), Disp: 30 tablet, Rfl: 3 .  fluticasone (FLONASE) 50 MCG/ACT nasal spray, Place 2 sprays into both nostrils daily., Disp: 16 g, Rfl: 11 .  ibuprofen (ADVIL) 400 MG tablet, Take 1-2 tablets (400-800 mg total) by mouth every 8 (eight) hours as needed., Disp: 30 tablet, Rfl: 0 .  lansoprazole (PREVACID) 30 MG capsule, TAKE 1 CAPSULE BY MOUTH TWICE A DAY, Disp: 60 capsule, Rfl: 0 .  linaclotide (LINZESS) 290 MCG CAPS capsule, TAKE 1 CAPSULE (290 MCG TOTAL) BY MOUTH DAILY BEFORE BREAKFAST. (Patient taking differently: TAKE 1 CAPSULE (290 MCG TOTAL) BY MOUTH DAILY in pm), Disp: 90 capsule, Rfl: 1 .  verapamil (CALAN) 120 MG tablet, Take 60 mg by mouth once. For migraines, Disp: , Rfl:  .  ALPRAZolam (XANAX) 0.5 MG tablet, Take 1/2-1 tab po bid prn anxiety (Patient not taking: Reported on 04/22/2020), Disp: 60 tablet, Rfl: 1  EXAM:  VITALS per patient if applicable: Temp 21.3 F (36.5 C) (Oral)   Ht 5\' 11"  (1.803 m)   Wt 155 lb (70.3 kg)   SpO2 98%   BMI 21.62 kg/m    GENERAL: alert, oriented, appears well and in no acute distress  HEENT: atraumatic, conjunttiva clear, no obvious abnormalities on inspection of external nose and ears  NECK: normal movements of the head and neck  LUNGS: on inspection no signs of respiratory distress, breathing rate appears normal, no obvious gross SOB, gasping or wheezing  CV: no obvious cyanosis  MS: moves all visible extremities without noticeable abnormality  PSYCH/NEURO: pleasant and cooperative, no obvious depression or anxiety, speech and thought processing grossly intact  LABS: none today  Lab Results  Component Value Date   TSH 1.94 06/23/2018   Lab Results   Component Value Date   WBC 6.1 06/29/2019   HGB 16.0 02/08/2020   HCT 47.0 02/08/2020   MCV 97.4 06/29/2019   PLT 320.0 06/29/2019   Lab Results  Component Value Date   CREATININE 0.90 02/08/2020   BUN 9 02/08/2020   NA 139 02/08/2020   K 3.9 02/08/2020   CL 102 02/08/2020   CO2 29 06/29/2019   Lab Results  Component Value Date   ALT 26 06/29/2019   AST 23 06/29/2019   ALKPHOS 86 06/29/2019   BILITOT 0.7 06/29/2019   Lab Results  Component Value Date   CHOL 174 06/29/2019   Lab Results  Component Value Date   HDL 72.00 06/29/2019   Lab Results  Component Value Date   LDLCALC 92  06/29/2019   Lab Results  Component Value Date   TRIG 47.0 06/29/2019   Lab Results  Component Value Date   CHOLHDL 2 06/29/2019   Lab Results  Component Value Date   PSA 0.22 06/29/2019   PSA 0.17 06/23/2018   PSA 0.29 06/17/2017    ASSESSMENT AND PLAN:  Discussed the following assessment and plan:  1) GAD, with recent superimposed adjustment d/o from covid 19 crisis and it's associated life changes/work changes, etc. He is much improved lately, no longer taking xanax and doesn't want to take this kind of med. He doesn't think he needs any meds for this problem at this time.  2) HLD: tolerating statin. Plan rpt labs at [facility] coming up in 3 mo.  -we discussed possible serious and likely etiologies, options for evaluation and workup, limitations of telemedicine visit vs in person visit, treatment, treatment risks and precautions. Pt prefers to treat via telemedicine empirically rather then risking or undertaking an in person visit at this moment. Patient agrees to seek prompt in person care if worsening, new symptoms arise, or if is not improving with treatment.   I discussed the assessment and treatment plan with the patient. The patient was provided an opportunity to ask questions and all were answered. The patient agreed with the plan and demonstrated an understanding of the  instructions.   The patient was advised to call back or seek an in-person evaluation if the symptoms worsen or if the condition fails to improve as anticipated.  F/u: plans for CPE 07/12/20  Signed:  Crissie Sickles, MD           04/22/2020

## 2020-04-22 NOTE — Addendum Note (Signed)
Addended by: Tammi Sou on: 04/22/2020 10:56 AM   Modules accepted: Orders

## 2020-04-30 DIAGNOSIS — M50321 Other cervical disc degeneration at C4-C5 level: Secondary | ICD-10-CM | POA: Diagnosis not present

## 2020-04-30 DIAGNOSIS — M50323 Other cervical disc degeneration at C6-C7 level: Secondary | ICD-10-CM | POA: Diagnosis not present

## 2020-04-30 DIAGNOSIS — M9901 Segmental and somatic dysfunction of cervical region: Secondary | ICD-10-CM | POA: Diagnosis not present

## 2020-04-30 DIAGNOSIS — M50322 Other cervical disc degeneration at C5-C6 level: Secondary | ICD-10-CM | POA: Diagnosis not present

## 2020-05-03 DIAGNOSIS — H43393 Other vitreous opacities, bilateral: Secondary | ICD-10-CM | POA: Diagnosis not present

## 2020-05-03 DIAGNOSIS — H25041 Posterior subcapsular polar age-related cataract, right eye: Secondary | ICD-10-CM | POA: Diagnosis not present

## 2020-05-03 DIAGNOSIS — H2513 Age-related nuclear cataract, bilateral: Secondary | ICD-10-CM | POA: Diagnosis not present

## 2020-05-03 DIAGNOSIS — H25012 Cortical age-related cataract, left eye: Secondary | ICD-10-CM | POA: Diagnosis not present

## 2020-05-14 ENCOUNTER — Ambulatory Visit: Payer: BC Managed Care – PPO | Admitting: Gastroenterology

## 2020-05-14 ENCOUNTER — Encounter: Payer: Self-pay | Admitting: Gastroenterology

## 2020-05-14 ENCOUNTER — Other Ambulatory Visit: Payer: Self-pay | Admitting: Family Medicine

## 2020-05-14 VITALS — BP 108/72 | HR 80 | Ht 71.0 in | Wt 149.0 lb

## 2020-05-14 DIAGNOSIS — R1084 Generalized abdominal pain: Secondary | ICD-10-CM

## 2020-05-14 DIAGNOSIS — K5909 Other constipation: Secondary | ICD-10-CM

## 2020-05-14 MED ORDER — DICYCLOMINE HCL 10 MG PO CAPS
10.0000 mg | ORAL_CAPSULE | Freq: Two times a day (BID) | ORAL | 3 refills | Status: DC
Start: 2020-05-14 — End: 2020-08-06

## 2020-05-14 NOTE — Patient Instructions (Addendum)
If you are age 66 or older, your body mass index should be between 23-30. Your Body mass index is 20.78 kg/m. If this is out of the aforementioned range listed, please consider follow up with your Primary Care Provider.  If you are age 57 or younger, your body mass index should be between 19-25. Your Body mass index is 20.78 kg/m. If this is out of the aformentioned range listed, please consider follow up with your Primary Care Provider.   We have sent the following medications to your pharmacy for you to pick up at your convenience: Dicyclomine 10 mg twice daily.   Follow up in 4-6 weeks.

## 2020-05-14 NOTE — Progress Notes (Signed)
05/14/2020 Jacob Blanchard [id number] Dec 14, 1955   HISTORY OF PRESENT ILLNESS:  This is a 64 year old male who is a patient of Dr. Blanch Media.  He has history of small bowel obstruction related to adhesions.  Had ex-lap with lysis of adhesions in 06/2018.  He was admitted to the hospital again in 02/2019 at [facility] for 3 days with partial small bowel obstruction.  He had a CT enterography in June 2020  that showed the following:  1. Small bowel appears normal. No findings for obstruction. No findings for acute enteritis. 2. Large amount of stool throughout the colon. 3. Emphysema. 4. Compression fractures of T11 and T12 vertebral bodies. The compression fracture at T11 has progressed since prior study. I saw him in 05/2019 for complaints of constipation.  Started him on Linzess 290 mcg daily.    Last colonoscopy 01/2020:  - Four 1 to 2 mm polyps in the descending colon and in the ascending colon, removed with a cold snare. Resected and retrieved. - Diverticulosis in the sigmoid colon. - The examination was otherwise normal on direct and retroflexion views.  Tubular adenomas and hyperplastic polyps.  He has had his ventral/incisional hernia repaired in May of this year.  He has struggled with regulating his bowels and taking his Linzess.  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  He is currently waking up and taking it in the middle of the night so that he can have bowel movements worse in the morning before leaving for work. He is also taking MiraLAX at bedtime. It sounds like that he is having several bowel movements, but mostly liquid/watery, but he is asking about increasing his regimen further. It is somewhat confusing how he relays his symptoms and sometimes I have a very hard time trying to decipher exactly what is going on. He complains of intermittent diffuse abdominal pains, but more so on the  left side. Overall he has had 3 CT scans in September 2019. He is asking for some other type of imaging.  Past Medical History:  Diagnosis Date  . Anxiety    prn xanax started 01/2019->self d/c'd in fall/winter 2020  . Barrett's esophagus    Bx+ 2007, 2009, and 2020.  Marland Kitchen Chronic constipation    GI->Linzess trial 05/2019->helpful  . Contact dermatitis and other eczema, due to unspecified cause    +scarring  . Epididymal cyst 06/2018   Bilat: small on R, but 4 cm on left  Asymptomatic.  Marland Kitchen GERD (gastroesophageal reflux disease)   . History of adenomatous polyp of colon 08/10/2006; 01/2020   2007 and 12/2018; 01/2020, recall 2026  . Hyperlipidemia   . Immunization series complete    has had both doses pfizer covid 19 vaccine  . Incisional hernia   . Left foot pain    ? Arch strain.  Some numbness to suggest possible tarsal tunnel syndrome vs lumbar radiculitis (sports med MD).  Responded very well to custom orthotic.  . Migraine syndrome    verapamil prophylaxis, cluster migraine  . Seasonal and perennial allergic rhinitis    vaccine/injection therapy helpful  . Small bowel obstruction ([place]) Fall of 2019   surgery->lysis of adhesions  . Vertebral compression fracture ([place]) 05/2019   T11 and T12 and L 1, minimal trauma.  Dr. Darene Lamer put him on fosamax.   Past Surgical History:  Procedure Laterality Date  . ABDOMINAL  EXPLORATION SURGERY  Fall 2019   SBO->lysis of adhesions  . COLONOSCOPY W/ POLYPECTOMY  08/10/06; 12/2018   Several adenomatous polyps 2007 and 2020; 01-25-2020. recall 2026  . DEXA  05/24/2019   Dr. Bartholome Bill -2.4 + vert comp fx->fosamax started.  . ESOPHAGOGASTRODUODENOSCOPY  2007 and 07/20/08;12/2018   GERD, BARRET's esoph (Dr. Henrene Pastor). Same 12/2018.  Marland Kitchen INCISIONAL HERNIA REPAIR N/A 02/08/2020   Procedure: LAPAROSCOPIC INCISIONAL HERNIA REPAIR WITH MESH;  Surgeon: Kinsinger, Arta Bruce, MD;  Location: [facility];  Service: General;  Laterality: N/A;  . LE venous  doppler u/s  04/19/15   L leg NORMAL  . right knee arthroscopy  1980s   Torn meniscus (Dr. Noemi Chapel)    reports that he quit smoking about 26 years ago. His smoking use included cigarettes. He has a 10.00 pack-year smoking history. He has never used smokeless tobacco. He reports current alcohol use. He reports that he does not use drugs. family history includes Healthy in his brother; Other in his father. Allergies  Allergen Reactions  . Oxycodone     nausea      Outpatient Encounter Medications as of 05/14/2020  Medication Sig  . alendronate (FOSAMAX) 70 MG tablet TAKE 1 TABLET (70 MG TOTAL) BY MOUTH EVERY 7 (SEVEN) DAYS.  Marland Kitchen atorvastatin (LIPITOR) 40 MG tablet Take 1 tablet (40 mg total) by mouth daily.  . Calcium Carbonate-Vitamin D 600-400 MG-UNIT tablet Take 1 tablet by mouth 2 (two) times daily. (Patient taking differently: Take 1 tablet by mouth daily. )  . cyclobenzaprine (FLEXERIL) 10 MG tablet Take 1 tablet (10 mg total) by mouth 2 (two) times daily as needed.  . fluticasone (FLONASE) 50 MCG/ACT nasal spray Place 2 sprays into both nostrils daily.  Marland Kitchen ibuprofen (ADVIL) 400 MG tablet Take 1-2 tablets (400-800 mg total) by mouth every 8 (eight) hours as needed.  . lansoprazole (PREVACID) 30 MG capsule TAKE 1 CAPSULE BY MOUTH TWICE A DAY  . linaclotide (LINZESS) 290 MCG CAPS capsule TAKE 1 CAPSULE (290 MCG TOTAL) BY MOUTH DAILY BEFORE BREAKFAST. (Patient taking differently: TAKE 1 CAPSULE (290 MCG TOTAL) BY MOUTH DAILY in pm)  . verapamil (CALAN) 120 MG tablet Take 60 mg by mouth once. For migraines   No facility-administered encounter medications on file as of 05/14/2020.     REVIEW OF SYSTEMS  : All other systems reviewed and negative except where noted in the History of Present Illness.   PHYSICAL EXAM: BP 108/72   Pulse 80   Ht 5\' 11"  (1.803 m)   Wt 149 lb (67.6 kg)   BMI 20.78 kg/m  General: Well developed white male in no acute distress Head: Normocephalic and  atraumatic Eyes:  Sclerae anicteric, conjunctiva pink. Ears: Normal auditory acuity Lungs: Clear throughout to auscultation; no increased WOB. Heart: Regular rate and rhythm; no M/R/G. Abdomen: Soft, non-distended.  BS present.  Non-tender. Musculoskeletal: Symmetrical with no gross deformities  Skin: No lesions on visible extremities Extremities: No edema  Neurological: Alert oriented x 4, grossly non-focal Psychological:  Alert and cooperative. Normal mood and affect  ASSESSMENT AND PLAN: *64 year old male with chronic constipation and recurrent complaints of abdominal pain.  Does have history of SBO due to adhesions with subsequent surgery and CT enterography that looked good in 02/2019. It sounds like that he is having several liquid/watery bowel movements a day with taking Linzess 290 mcg daily and MiraLAX at bedtime, but he is asking about increasing his regimen further. I sometimes have  a difficult time deciphering exactly what he is relaying when he discusses his symptoms. We discussed possibly changing his medication to [place] instead of the [place]. He is asking about other imaging with an MRI, etc. He has had 3 CT scans in September 2019. I do not think that we are missing anything bad or serious. I think that he has a lot of anxiety and IBS related to/surrounding that. I have asked him to try Bentyl 10 mg twice daily on a regular basis for the next several weeks to see if that helps with his abdominal pain. If not then could consider another CT scan although I think it is going to be very low yield and unrevealing. He will return for follow-up in about 4 to 6 weeks. Prescription was sent to his pharmacy.   CC:  McGowen, Adrian Blackwater, MD

## 2020-05-15 NOTE — Telephone Encounter (Signed)
Rx refilled with 2 refills. Further refills pending appointment 07/12/20

## 2020-05-28 ENCOUNTER — Telehealth: Payer: Self-pay | Admitting: Gastroenterology

## 2020-05-28 DIAGNOSIS — M50322 Other cervical disc degeneration at C5-C6 level: Secondary | ICD-10-CM | POA: Diagnosis not present

## 2020-05-28 DIAGNOSIS — M50321 Other cervical disc degeneration at C4-C5 level: Secondary | ICD-10-CM | POA: Diagnosis not present

## 2020-05-28 DIAGNOSIS — M50323 Other cervical disc degeneration at C6-C7 level: Secondary | ICD-10-CM | POA: Diagnosis not present

## 2020-05-28 DIAGNOSIS — M9901 Segmental and somatic dysfunction of cervical region: Secondary | ICD-10-CM | POA: Diagnosis not present

## 2020-05-28 NOTE — Telephone Encounter (Signed)
Pt is wanting to inform Alonza Bogus that the medication he has been taking for two weeks is not helping Bentyl, pt would like to know if Janett Billow would like to order a CT scan for him.

## 2020-05-28 NOTE — Telephone Encounter (Signed)
Spoke to patient who states that he was started on Dicyclomine 10 mg twice daily at 05/14/20 office visit. He was calling back with a report stating that his symptoms have not improved.He continues to have alternating right and left side abdominal pain. Constipation is somewhat controlled with Linzess 290 mcg. Patient mentioned that it was discussed at his office visit that a CT abd/pelvis would be the next step.A CT Enterography was done 03/10/19. Jessica, I did not see anything under your assessment and plan. Please advise.

## 2020-05-29 DIAGNOSIS — R1084 Generalized abdominal pain: Secondary | ICD-10-CM | POA: Insufficient documentation

## 2020-05-30 ENCOUNTER — Other Ambulatory Visit: Payer: BC Managed Care – PPO

## 2020-05-30 ENCOUNTER — Other Ambulatory Visit: Payer: Self-pay | Admitting: Gastroenterology

## 2020-05-30 DIAGNOSIS — R1084 Generalized abdominal pain: Secondary | ICD-10-CM

## 2020-05-30 LAB — BUN/CREATININE RATIO
BUN: 11 mg/dL (ref 7–25)
Creat: 1.02 mg/dL (ref 0.70–1.25)
GFR, Est African American: 89 mL/min/{1.73_m2} (ref >=60)
GFR, Est Non African American: 77 mL/min/{1.73_m2} (ref >=60)

## 2020-05-30 NOTE — Telephone Encounter (Signed)
We discussed that if he did not have improvement on the Bentyl we could consider another CT scan, but he has had 3 CT scans overall including the CT enterography since September 2019 so I do not think it is going to show [place] any cause of his symptoms. I think that this is all IBS. I really wanted him to try the Bentyl longer than just 2 weeks, in fact he has a follow-up with me again on September 15. Go ahead and order CT scan of the abdomen and pelvis with contrast for complaints of generalized abdominal pain. Then all we can do is offer him reassurance if it is unremarkable and continue to try to manage the symptoms.  Thank you for your help,  Jess

## 2020-05-30 NOTE — Progress Notes (Signed)
Noted  

## 2020-05-30 NOTE — Telephone Encounter (Signed)
Spoke to patient to inform him of Jessica's recommendations for his abdominal pain. CT is scheduled for 06/06/20 at [facility]. Patient will come by the office in the next few days for labs, to pick up contract and instructions. All questions answered. Patient voiced understanding.

## 2020-06-06 ENCOUNTER — Other Ambulatory Visit: Payer: Self-pay

## 2020-06-06 ENCOUNTER — Ambulatory Visit (HOSPITAL_COMMUNITY)
Admission: RE | Admit: 2020-06-06 | Discharge: 2020-06-06 | Disposition: A | Discharge status: Home / Self Care | Payer: BC Managed Care – PPO | Source: Ambulatory Visit | Attending: Gastroenterology | Admitting: Gastroenterology

## 2020-06-06 ENCOUNTER — Encounter (HOSPITAL_COMMUNITY): Payer: Self-pay

## 2020-06-06 DIAGNOSIS — R1084 Generalized abdominal pain: Secondary | ICD-10-CM

## 2020-06-06 DIAGNOSIS — K5730 Diverticulosis of large intestine without perforation or abscess without bleeding: Secondary | ICD-10-CM | POA: Diagnosis not present

## 2020-06-06 MED ORDER — IOHEXOL 300 MG/ML  SOLN
100.0000 mL | Freq: Once | INTRAMUSCULAR | Status: AC | PRN
  Administered 2020-06-06: 100 mL via INTRAVENOUS

## 2020-06-12 ENCOUNTER — Other Ambulatory Visit: Payer: Self-pay | Admitting: Gastroenterology

## 2020-06-12 DIAGNOSIS — H25813 Combined forms of age-related cataract, bilateral: Secondary | ICD-10-CM | POA: Diagnosis not present

## 2020-06-12 DIAGNOSIS — H52223 Regular astigmatism, bilateral: Secondary | ICD-10-CM | POA: Diagnosis not present

## 2020-06-13 ENCOUNTER — Other Ambulatory Visit: Payer: Self-pay | Admitting: Gastroenterology

## 2020-06-13 ENCOUNTER — Other Ambulatory Visit: Payer: Self-pay

## 2020-06-13 MED ORDER — LUBIPROSTONE 24 MCG PO CAPS
24.0000 ug | ORAL_CAPSULE | Freq: Two times a day (BID) | ORAL | 3 refills | Status: DC
Start: 2020-06-13 — End: 2020-06-19

## 2020-06-17 ENCOUNTER — Emergency Department
Admission: EM | Admit: 2020-06-17 | Discharge: 2020-06-17 | Disposition: A | Discharge status: Home / Self Care | Payer: BC Managed Care – PPO | Source: Home / Self Care

## 2020-06-17 ENCOUNTER — Other Ambulatory Visit: Payer: Self-pay

## 2020-06-17 DIAGNOSIS — J0100 Acute maxillary sinusitis, unspecified: Secondary | ICD-10-CM

## 2020-06-17 DIAGNOSIS — R519 Headache, unspecified: Secondary | ICD-10-CM | POA: Diagnosis not present

## 2020-06-17 MED ORDER — DOXYCYCLINE HYCLATE 100 MG PO CAPS
100.0000 mg | ORAL_CAPSULE | Freq: Two times a day (BID) | ORAL | 0 refills | Status: AC
Start: 2020-06-17 — End: 2020-06-24

## 2020-06-17 MED ORDER — IPRATROPIUM BROMIDE 0.06 % NA SOLN
2.0000 | Freq: Four times a day (QID) | NASAL | 1 refills | Status: DC
Start: 2020-06-17 — End: 2021-09-18

## 2020-06-17 NOTE — ED Triage Notes (Signed)
Patient presents to Urgent Care with complaints of sinus pressure on the left side under his eye since about4 days ago. Patient reports he does not have nasal drainage, cough, shortness of breath. Pt does not think it could be coming from dental pain. Pt has been vaccinated for covid.

## 2020-06-17 NOTE — ED Provider Notes (Signed)
Jacob Blanchard CARE    CSN: [id number] Arrival date & time: 06/17/20  1521      History   Chief Complaint Chief Complaint  Patient presents with  . Sinusitis    HPI Jacob Blanchard is a 64 y.o. male.   HPI  Jacob Blanchard is a 64 y.o. male presenting to UC with c/o 4 days of Left maxillary sinus pressure and soreness, minimal congestion.  Denies fever, chills, cough, post-nasal drainage. Denies ear pain. No change in vision. No sick contacts or recent travel. Pt fully vaccinated for COVID.  Denies tooth pain or pain with chewing.  Pt concerned he has a sinus infection but has not taken any medication for his symptoms.    Past Medical History:  Diagnosis Date  . Anxiety    prn xanax started 01/2019->self d/c'd in fall/winter 2020  . Barrett's esophagus    Bx+ 2007, 2009, and 2020.  Marland Kitchen Chronic constipation    GI->Linzess trial 05/2019->helpful  . Contact dermatitis and other eczema, due to unspecified cause    +scarring  . Epididymal cyst 06/2018   Bilat: small on R, but 4 cm on left  Asymptomatic.  Marland Kitchen GERD (gastroesophageal reflux disease)   . History of adenomatous polyp of colon 08/10/2006; 01/2020   2007 and 12/2018; 01/2020, recall 2026  . Hyperlipidemia   . Immunization series complete    has had both doses pfizer covid 19 vaccine  . Incisional hernia   . Left foot pain    ? Arch strain.  Some numbness to suggest possible tarsal tunnel syndrome vs lumbar radiculitis (sports med MD).  Responded very well to custom orthotic.  . Migraine syndrome    verapamil prophylaxis, cluster migraine  . Seasonal and perennial allergic rhinitis    vaccine/injection therapy helpful  . Small bowel obstruction ([place]) Fall of 2019   surgery->lysis of adhesions  . Vertebral compression fracture ([place]) 05/2019   T11 and T12 and L 1, minimal trauma.  Dr. Darene Lamer put him on fosamax.    Patient Active Problem List   Diagnosis Date Noted  . Generalized abdominal pain 05/29/2020  . Personal  history of colonic polyps 12/14/2019  . Umbilical hernia without obstruction and without gangrene 12/14/2019  . Osteoporosis 05/24/2019  . Lumbar spondylosis 05/12/2019  . Thoracic compression fracture ([place]) 05/12/2019  . Chronic constipation 05/12/2019  . Partial bowel obstruction ([place]) 02/19/2019  . Left foot pain 01/21/2017  . Maxillary sinusitis 07/19/2015  . Chronic asthma 10/03/2012  . Seasonal and perennial allergic rhinitis 06/05/2008  . GERD 06/05/2008  . Eczema 06/05/2008  . Fatigue 02/21/2008    Past Surgical History:  Procedure Laterality Date  . ABDOMINAL EXPLORATION SURGERY  Fall 2019   SBO->lysis of adhesions  . COLONOSCOPY W/ POLYPECTOMY  08/10/06; 12/2018   Several adenomatous polyps 2007 and 2020; 01-25-2020. recall 2026  . DEXA  05/24/2019   Dr. Bartholome Bill -2.4 + vert comp fx->fosamax started.  . ESOPHAGOGASTRODUODENOSCOPY  2007 and 07/20/08;12/2018   GERD, BARRET's esoph (Dr. Henrene Pastor). Same 12/2018.  Marland Kitchen INCISIONAL HERNIA REPAIR N/A 02/08/2020   Procedure: LAPAROSCOPIC INCISIONAL HERNIA REPAIR WITH MESH;  Surgeon: Kinsinger, Arta Bruce, MD;  Location: [facility];  Service: General;  Laterality: N/A;  . LE venous doppler u/s  04/19/15   L leg NORMAL  . right knee arthroscopy  1980s   Torn meniscus (Dr. Noemi Chapel)       Home Medications    Prior to Admission medications   Medication Sig  Start Date End Date Taking? Authorizing Provider  alendronate (FOSAMAX) 70 MG tablet TAKE 1 TABLET (70 MG TOTAL) BY MOUTH EVERY 7 (SEVEN) DAYS. 03/11/20   Silverio Decamp, MD  atorvastatin (LIPITOR) 40 MG tablet Take 1 tablet (40 mg total) by mouth daily. 04/22/20   McGowen, Adrian Blackwater, MD  Calcium Carbonate-Vitamin D 600-400 MG-UNIT tablet Take 1 tablet by mouth 2 (two) times daily. Patient taking differently: Take 1 tablet by mouth daily.  06/23/19   Silverio Decamp, MD  cyclobenzaprine (FLEXERIL) 10 MG tablet Take 1 tablet (10 mg total) by mouth 2 (two) times  daily as needed. 04/22/20   McGowen, Adrian Blackwater, MD  dicyclomine (BENTYL) 10 MG capsule Take 1 capsule (10 mg total) by mouth 2 (two) times daily. 05/14/20   Zehr, Laban Emperor, PA-C  doxycycline (VIBRAMYCIN) 100 MG capsule Take 1 capsule (100 mg total) by mouth 2 (two) times daily for 7 days. 06/17/20 06/24/20  Noe Gens, PA-C  fluticasone (FLONASE) 50 MCG/ACT nasal spray Place 2 sprays into both nostrils daily. 04/22/20   McGowen, Adrian Blackwater, MD  ibuprofen (ADVIL) 400 MG tablet Take 1-2 tablets (400-800 mg total) by mouth every 8 (eight) hours as needed. 04/11/20   Noe Gens, PA-C  ipratropium (ATROVENT) 0.06 % nasal spray Place 2 sprays into both nostrils 4 (four) times daily. 06/17/20   Noe Gens, PA-C  lansoprazole (PREVACID) 30 MG capsule TAKE 1 CAPSULE BY MOUTH TWICE A DAY 05/15/20   McGowen, Adrian Blackwater, MD  lubiprostone (AMITIZA) 24 MCG capsule Take 1 capsule (24 mcg total) by mouth 2 (two) times daily with a meal. 06/13/20   Zehr, Laban Emperor, PA-C  verapamil (CALAN) 120 MG tablet Take 60 mg by mouth once. For migraines 06/19/19   [provider]    Family History Family History  Problem Relation Age of Onset  . Other Father        chemical PNA siphoning gas  . Healthy Brother   . Colon cancer Neg Hx   . Esophageal cancer Neg Hx   . Rectal cancer Neg Hx   . Stomach cancer Neg Hx     Social History Social History   Tobacco Use  . Smoking status: Former Smoker    Packs/day: 1.00    Years: 10.00    Pack years: 10.00    Types: Cigarettes    Quit date: 07/06/1993    Years since quitting: 26.9  . Smokeless tobacco: Never Used  Vaping Use  . Vaping Use: Never used  Substance Use Topics  . Alcohol use: Yes    Comment: 1 or 2 daily  . Drug use: No     Allergies   Oxycodone   Review of Systems Review of Systems  Constitutional: Negative for chills and fever.  HENT: Positive for congestion, sinus pressure and sinus pain. Negative for dental problem, ear pain,  postnasal drip, sore throat, trouble swallowing and voice change.   Respiratory: Negative for cough and shortness of breath.   Cardiovascular: Negative for chest pain and palpitations.  Gastrointestinal: Negative for abdominal pain, diarrhea, nausea and vomiting.  Musculoskeletal: Negative for arthralgias, back pain and myalgias.  Skin: Negative for rash.  Neurological: Negative for dizziness and light-headedness.  All other systems reviewed and are negative.    Physical Exam Triage Vital Signs ED Triage Vitals  Enc Vitals Group     BP 06/17/20 1549 125/89     Pulse Rate 06/17/20 1549 85  Resp 06/17/20 1549 16     Temp 06/17/20 1549 98.4 F (36.9 C)     Temp Source 06/17/20 1549 Oral     SpO2 06/17/20 1549 98 %     Weight --      Height --      Head Circumference --      Peak Flow --      Pain Score 06/17/20 1546 2     Pain Loc --      Pain Edu? --      Excl. in [place]? --    No data found.  Updated Vital Signs BP 125/89 (BP Location: Right Arm)   Pulse 85   Temp 98.4 F (36.9 C) (Oral)   Resp 16   SpO2 98%      Physical Exam Vitals and nursing note reviewed.  Constitutional:      General: He is not in acute distress.    Appearance: Normal appearance. He is well-developed. He is not ill-appearing, toxic-appearing or diaphoretic.  HENT:     Head: Normocephalic and atraumatic.     Right Ear: Tympanic membrane and ear canal normal.     Left Ear: Tympanic membrane and ear canal normal.     Nose:     Right Sinus: No maxillary sinus tenderness or frontal sinus tenderness.     Left Sinus: Maxillary sinus tenderness (mild) present. No frontal sinus tenderness.     Mouth/Throat:     Lips: Pink.     Mouth: Mucous membranes are moist.     Dentition: No dental tenderness, dental caries or dental abscesses.     Pharynx: Oropharynx is clear. Uvula midline. No pharyngeal swelling, oropharyngeal exudate, posterior oropharyngeal erythema or uvula swelling.  Cardiovascular:      Rate and Rhythm: Normal rate and regular rhythm.  Pulmonary:     Effort: Pulmonary effort is normal. No respiratory distress.     Breath sounds: Normal breath sounds. No stridor. No wheezing, rhonchi or rales.  Musculoskeletal:        General: Normal range of motion.     Cervical back: Normal range of motion.  Skin:    General: Skin is warm and dry.  Neurological:     Mental Status: He is alert and oriented to person, place, and time.  Psychiatric:        Behavior: Behavior normal.      UC Treatments / Results  Labs (all labs ordered are listed, but only abnormal results are displayed) Labs Reviewed - No data to display  EKG   Radiology No results found.  Procedures Procedures (including critical care time)  Medications Ordered in UC Medications - No data to display  Initial Impression / Assessment and Plan / UC Course  I have reviewed the triage vital signs and the nursing notes.  Pertinent labs & imaging results that were available during my care of the patient were reviewed by me and considered in my medical decision making (see chart for details).    Pain most c/w sinus headache, unlikely to be bacterial infection at this time. Pt concerned symptoms will continue to worsen into an infection, hx of same. Encouraged symptomatic tx first Prescription for doxycycline provided to take only if symptoms worsening F/u with PCP as needed AVS given  Final Clinical Impressions(s) / UC Diagnoses   Final diagnoses:  Sinus headache  Acute non-recurrent maxillary sinusitis     Discharge Instructions     You may take 500mg  acetaminophen every 4-6 hours  or in combination with ibuprofen 400-600mg  every 6-8 hours as needed for pain, inflammation, and fever.  Be sure to well hydrated with clear liquids and get at least 8 hours of sleep at night, preferably more while sick.   Please follow up with family medicine in 1 week if needed.     ED Prescriptions     Medication Sig Dispense Auth. Provider   ipratropium (ATROVENT) 0.06 % nasal spray Place 2 sprays into both nostrils 4 (four) times daily. 15 mL Leeroy Cha O, PA-C   doxycycline (VIBRAMYCIN) 100 MG capsule Take 1 capsule (100 mg total) by mouth 2 (two) times daily for 7 days. 14 capsule Noe Gens, [place]     PDMP not reviewed this encounter.   Noe Gens, [place] 06/17/20 1851

## 2020-06-17 NOTE — Discharge Instructions (Signed)
  You may take 500mg acetaminophen every 4-6 hours or in combination with ibuprofen 400-600mg every 6-8 hours as needed for pain, inflammation, and fever.  Be sure to well hydrated with clear liquids and get at least 8 hours of sleep at night, preferably more while sick.   Please follow up with family medicine in 1 week if needed.   

## 2020-06-19 ENCOUNTER — Ambulatory Visit: Payer: BC Managed Care – PPO | Admitting: Gastroenterology

## 2020-06-19 ENCOUNTER — Encounter: Payer: Self-pay | Admitting: Gastroenterology

## 2020-06-19 VITALS — BP 110/70 | HR 97 | Ht 71.0 in | Wt 149.0 lb

## 2020-06-19 DIAGNOSIS — K5909 Other constipation: Secondary | ICD-10-CM

## 2020-06-19 NOTE — Progress Notes (Signed)
Noted  

## 2020-06-19 NOTE — Progress Notes (Signed)
06/19/2020 Jacob Blanchard [id number] May 14, 1956   HISTORY OF PRESENT ILLNESS: This is a 64 year old male who is a patient of Dr. Blanch Media.  He follows here for complaints of chronic constipation and recurrent complaints of abdominal pain.  He has a history of small bowel obstruction related to adhesions.  Had ex lap with lysis of adhesions in September 2019.  He was admitted to the hospital again in May 2020 at [facility] for 3 days with partial small bowel obstruction.  He had a subsequent CT enterography in June 2020 that showed no bowel issues except for a large amount of stool throughout the colon.    Last colonoscopy April 2021 as follows:  - Four 1 to 2 mm polyps in the descending colon and in the ascending colon, removed with a cold snare. Resected and retrieved. - Diverticulosis in the sigmoid colon. - The examination was otherwise normal on direct and retroflexion views.  Tubular adenomas and hyperplastic polyps.  Please see my note from his last visit on May 14, 2020 for further details.  Nonetheless at the conclusion of that visit he was taking Linzess 290 mcg daily and a dose of MiraLAX every day.  I started him on Bentyl twice daily as well.  He was requesting some type of imaging but we decided to hold off.  In the interim he called back requesting CT scan.  Once again CT scan of the abdomen and pelvis with contrast was performed on September 2 and showed signs of diverticular disease in the sigmoid colon as well as partially stool-filled colon.  No acute issues.  We tried changing from [place] to [place], but that was too expensive.  Now he is continuing on his Linzess 290 mcg daily, but has discontinued the MiraLAX and is now taking some prune juice every day as well.  He continues to have 2 large volume liquidy bowel movements each morning with his current regimen.  He seems to be satisfied with this for now.  He continues to take the Bentyl twice daily, but is unsure if it is  helping much or not.  Past Medical History:  Diagnosis Date  . Anxiety    prn xanax started 01/2019->self d/c'd in fall/winter 2020  . Barrett's esophagus    Bx+ 2007, 2009, and 2020.  Marland Kitchen Chronic constipation    GI->Linzess trial 05/2019->helpful  . Contact dermatitis and other eczema, due to unspecified cause    +scarring  . Epididymal cyst 06/2018   Bilat: small on R, but 4 cm on left  Asymptomatic.  Marland Kitchen GERD (gastroesophageal reflux disease)   . History of adenomatous polyp of colon 08/10/2006; 01/2020   2007 and 12/2018; 01/2020, recall 2026  . Hyperlipidemia   . Immunization series complete    has had both doses pfizer covid 19 vaccine  . Incisional hernia   . Left foot pain    ? Arch strain.  Some numbness to suggest possible tarsal tunnel syndrome vs lumbar radiculitis (sports med MD).  Responded very well to custom orthotic.  . Migraine syndrome    verapamil prophylaxis, cluster migraine  . Seasonal and perennial allergic rhinitis    vaccine/injection therapy helpful  . Small bowel obstruction ([place]) Fall of 2019   surgery->lysis of adhesions  . Vertebral compression fracture ([place]) 05/2019   T11 and T12 and L 1, minimal trauma.  Dr. Darene Lamer put him on fosamax.   Past Surgical History:  Procedure Laterality Date  . ABDOMINAL EXPLORATION SURGERY  Fall 2019   SBO->lysis of adhesions  . COLONOSCOPY W/ POLYPECTOMY  08/10/06; 12/2018   Several adenomatous polyps 2007 and 2020; 01-25-2020. recall 2026  . DEXA  05/24/2019   Dr. Bartholome Bill -2.4 + vert comp fx->fosamax started.  . ESOPHAGOGASTRODUODENOSCOPY  2007 and 07/20/08;12/2018   GERD, BARRET's esoph (Dr. Henrene Pastor). Same 12/2018.  Marland Kitchen INCISIONAL HERNIA REPAIR N/A 02/08/2020   Procedure: LAPAROSCOPIC INCISIONAL HERNIA REPAIR WITH MESH;  Surgeon: Kinsinger, Arta Bruce, MD;  Location: [facility];  Service: General;  Laterality: N/A;  . LE venous doppler u/s  04/19/15   L leg NORMAL  . right knee arthroscopy  1980s   Torn meniscus  (Dr. Noemi Chapel)    reports that he quit smoking about 26 years ago. His smoking use included cigarettes. He has a 10.00 pack-year smoking history. He has never used smokeless tobacco. He reports current alcohol use. He reports that he does not use drugs. family history includes Healthy in his brother; Other in his father. Allergies  Allergen Reactions  . Oxycodone     nausea      Outpatient Encounter Medications as of 06/19/2020  Medication Sig  . alendronate (FOSAMAX) 70 MG tablet TAKE 1 TABLET (70 MG TOTAL) BY MOUTH EVERY 7 (SEVEN) DAYS.  Marland Kitchen atorvastatin (LIPITOR) 40 MG tablet Take 1 tablet (40 mg total) by mouth daily.  . Calcium Carbonate-Vitamin D 600-400 MG-UNIT tablet Take 1 tablet by mouth 2 (two) times daily. (Patient taking differently: Take 1 tablet by mouth daily. )  . cyclobenzaprine (FLEXERIL) 10 MG tablet Take 1 tablet (10 mg total) by mouth 2 (two) times daily as needed.  . dicyclomine (BENTYL) 10 MG capsule Take 1 capsule (10 mg total) by mouth 2 (two) times daily.  Marland Kitchen doxycycline (VIBRAMYCIN) 100 MG capsule Take 1 capsule (100 mg total) by mouth 2 (two) times daily for 7 days.  . fluticasone (FLONASE) 50 MCG/ACT nasal spray Place 2 sprays into both nostrils daily.  Marland Kitchen ibuprofen (ADVIL) 400 MG tablet Take 1-2 tablets (400-800 mg total) by mouth every 8 (eight) hours as needed.  Marland Kitchen ipratropium (ATROVENT) 0.06 % nasal spray Place 2 sprays into both nostrils 4 (four) times daily.  . lansoprazole (PREVACID) 30 MG capsule TAKE 1 CAPSULE BY MOUTH TWICE A DAY  . linaclotide (LINZESS) 290 MCG CAPS capsule Take 290 mcg by mouth daily before breakfast.  . verapamil (CALAN) 120 MG tablet Take 60 mg by mouth once. For migraines  . [DISCONTINUED] lubiprostone (AMITIZA) 24 MCG capsule Take 1 capsule (24 mcg total) by mouth 2 (two) times daily with a meal.   No facility-administered encounter medications on file as of 06/19/2020.     REVIEW OF SYSTEMS  : All other systems reviewed and  negative except where noted in the History of Present Illness.   PHYSICAL EXAM: BP 110/70   Pulse 97   Ht 5\' 11"  (1.803 m)   Wt 149 lb (67.6 kg)   BMI 20.78 kg/m  General: Well developed white male in no acute distress Head: Normocephalic and atraumatic Eyes:  Sclerae anicteric, conjunctiva pink. Ears: Normal auditory acuity Lungs: Clear throughout to auscultation; no W/R/R. Heart: Regular rate and rhythm; no M/R/G. Abdomen: Soft, non-distended.  BS present.  Non-tender. Musculoskeletal: Symmetrical with no gross deformities  Skin: No lesions on visible extremities Extremities: No edema  Neurological: Alert oriented x 4, grossly non-focal Psychological:  Alert and cooperative. Normal mood and affect  ASSESSMENT AND PLAN: *64 year old male with chronic constipation and recurrent  complaints of abdominal pain.  He does have history of SBO due to adhesions with subsequent surgery and CT enterography that looked good in May 2020.  He is currently on Linzess 290 mcg daily and was previously taking a dose of MiraLAX every day as well.  Since his last visit he has discontinued the MiraLAX and has been drinking prune juice.  Continues on the Linzess.  Another CT scan proved unremarkable except for stool throughout the colon.  We tried changing medication to [place], but this was too expensive for him.  He is taking Bentyl twice daily, but is unsure if it really makes much of a difference or not.  At this point he would like to continue the [place].  I have asked him to add Benefiber or Citrucel to his bowel regimen.  He had done this in the remote past.  This may help him move his bowels better but also allow some bulk and formed stools so they are not as liquidy.  He will start with 1 tablespoon mixed in 8 ounces of liquid daily and increase to twice daily if needed.  Advised that too much could cause bloating and associated abdominal discomfort.  He would just like to call for follow-up if he has  any new problems or ongoing issues.   CC:  McGowen, Adrian Blackwater, MD

## 2020-06-19 NOTE — Patient Instructions (Signed)
If you are age 66 or older, your body mass index should be between 23-30. Your Body mass index is 20.78 kg/m. If this is out of the aforementioned range listed, please consider follow up with your Primary Care Provider.  If you are age 62 or younger, your body mass index should be between 19-25. Your Body mass index is 20.78 kg/m. If this is out of the aformentioned range listed, please consider follow up with your Primary Care Provider.   Start Benefiber or Citrucel 2-3 teaspoons (as discussed with Jessica) in 8 ounces of liquid daily and may increase to twice daily if tolerated.   Follow up as needed.

## 2020-06-25 DIAGNOSIS — M50322 Other cervical disc degeneration at C5-C6 level: Secondary | ICD-10-CM | POA: Diagnosis not present

## 2020-06-25 DIAGNOSIS — M9901 Segmental and somatic dysfunction of cervical region: Secondary | ICD-10-CM | POA: Diagnosis not present

## 2020-06-25 DIAGNOSIS — M50323 Other cervical disc degeneration at C6-C7 level: Secondary | ICD-10-CM | POA: Diagnosis not present

## 2020-06-25 DIAGNOSIS — M50321 Other cervical disc degeneration at C4-C5 level: Secondary | ICD-10-CM | POA: Diagnosis not present

## 2020-06-29 ENCOUNTER — Ambulatory Visit: Payer: Self-pay

## 2020-06-29 ENCOUNTER — Telehealth: Payer: Self-pay | Admitting: Emergency Medicine

## 2020-06-29 MED ORDER — DOXYCYCLINE HYCLATE 100 MG PO CAPS: 100.0000 mg | ORAL_CAPSULE | Freq: Two times a day (BID) | ORAL | 0 refills | Status: DC

## 2020-06-29 NOTE — Telephone Encounter (Signed)
Refill doxycycline 100mg , one BID

## 2020-06-29 NOTE — Telephone Encounter (Signed)
Patient called back to report ongoing facial pressure and sense that his sinus infection did not clear with first course of antibiotics started 06/17/20.

## 2020-06-29 NOTE — Telephone Encounter (Signed)
Returning patient's message about return/worsening of symptoms; left message VM to call us today.

## 2020-07-08 DIAGNOSIS — H25813 Combined forms of age-related cataract, bilateral: Secondary | ICD-10-CM | POA: Diagnosis not present

## 2020-07-10 ENCOUNTER — Other Ambulatory Visit: Payer: Self-pay

## 2020-07-12 ENCOUNTER — Other Ambulatory Visit: Payer: Self-pay

## 2020-07-12 ENCOUNTER — Encounter: Payer: Self-pay | Admitting: Family Medicine

## 2020-07-12 ENCOUNTER — Ambulatory Visit (INDEPENDENT_AMBULATORY_CARE_PROVIDER_SITE_OTHER): Payer: BC Managed Care – PPO | Admitting: Family Medicine

## 2020-07-12 VITALS — BP 140/93 | HR 94 | Temp 97.6°F | Ht 68.5 in | Wt 149.0 lb

## 2020-07-12 DIAGNOSIS — R030 Elevated blood-pressure reading, without diagnosis of hypertension: Secondary | ICD-10-CM

## 2020-07-12 DIAGNOSIS — E7800 Pure hypercholesterolemia, unspecified: Secondary | ICD-10-CM | POA: Diagnosis not present

## 2020-07-12 DIAGNOSIS — Z0000 Encounter for general adult medical examination without abnormal findings: Secondary | ICD-10-CM

## 2020-07-12 DIAGNOSIS — Z125 Encounter for screening for malignant neoplasm of prostate: Secondary | ICD-10-CM | POA: Diagnosis not present

## 2020-07-12 DIAGNOSIS — Z23 Encounter for immunization: Secondary | ICD-10-CM | POA: Diagnosis not present

## 2020-07-12 LAB — LIPID PANEL
Cholesterol: 158 mg/dL (ref 0–200)
HDL: 72.7 mg/dL (ref >39.00)
LDL Cholesterol: 78 mg/dL (ref 0–99)
NonHDL: 85.41
Total CHOL/HDL Ratio: 2
Triglycerides: 39 mg/dL (ref 0.0–149.0)
VLDL: 7.8 mg/dL (ref 0.0–40.0)

## 2020-07-12 LAB — CBC WITH DIFFERENTIAL/PLATELET
Basophils Absolute: 0 10*3/uL (ref 0.0–0.1)
Basophils Relative: 0.5 % (ref 0.0–3.0)
Eosinophils Absolute: 0.1 10*3/uL (ref 0.0–0.7)
Eosinophils Relative: 1.5 % (ref 0.0–5.0)
HCT: 43.1 % (ref 39.0–52.0)
Hemoglobin: 14.5 g/dL (ref 13.0–17.0)
Lymphocytes Relative: 20.6 % (ref 12.0–46.0)
Lymphs Abs: 1 10*3/uL (ref 0.7–4.0)
MCHC: 33.6 g/dL (ref 30.0–36.0)
MCV: 97.8 fl (ref 78.0–100.0)
Monocytes Absolute: 0.7 10*3/uL (ref 0.1–1.0)
Monocytes Relative: 13.8 % — ABNORMAL HIGH (ref 3.0–12.0)
Neutro Abs: 3.2 10*3/uL (ref 1.4–7.7)
Neutrophils Relative %: 63.6 % (ref 43.0–77.0)
Platelets: 345 10*3/uL (ref 150.0–400.0)
RBC: 4.41 Mil/uL (ref 4.22–5.81)
RDW: 13.9 % (ref 11.5–15.5)
WBC: 5.1 10*3/uL (ref 4.0–10.5)

## 2020-07-12 LAB — COMPREHENSIVE METABOLIC PANEL
ALT: 32 U/L (ref 0–53)
AST: 24 U/L (ref 0–37)
Albumin: 4.5 g/dL (ref 3.5–5.2)
Alkaline Phosphatase: 58 U/L (ref 39–117)
BUN: 6 mg/dL (ref 6–23)
CO2: 31 mEq/L (ref 19–32)
Calcium: 10.1 mg/dL (ref 8.4–10.5)
Chloride: 103 mEq/L (ref 96–112)
Creatinine, Ser: 0.87 mg/dL (ref 0.40–1.50)
GFR: 90.03 mL/min (ref >60.00)
Glucose, Bld: 91 mg/dL (ref 70–99)
Potassium: 5.2 mEq/L — ABNORMAL HIGH (ref 3.5–5.1)
Sodium: 140 mEq/L (ref 135–145)
Total Bilirubin: 0.6 mg/dL (ref 0.2–1.2)
Total Protein: 6.9 g/dL (ref 6.0–8.3)

## 2020-07-12 LAB — PSA, MEDICARE: PSA: 0.16 ng/ml (ref 0.10–4.00)

## 2020-07-12 NOTE — Patient Instructions (Signed)

## 2020-07-12 NOTE — Progress Notes (Signed)
Office Note 07/12/2020  CC:  Chief Complaint  Patient presents with  . Annual Exam    HPI:  Jacob Blanchard is a 64 y.o. White male who is here for annual health maintenance exam and f/u HLD. I last saw him 3 mo ago. A/P as of last visit: "1) GAD, with recent superimposed adjustment d/o from covid 19 crisis and it's associated life changes/work changes, etc. He is much improved lately, no longer taking xanax and doesn't want to take this kind of med. He doesn't think he needs any meds for this problem at this time.  2) HLD: tolerating statin. Plan rpt labs at [facility] coming up in 3 mo."  INTERIM HX: Getting cataract surgery next week. Recent CT abd/pelv for abd pain 06/06/20 done by Dr. Henrene Pastor: ok except diverticulosis.  HLD: tolerating lipitor daily. 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 Exercise: walks 1 mild qod.Yard work.  He will be following up with Dr. Darene Lamer for his osteoporosis, fosamax.  Prior to recently starting sudafed his home bps were 120/70 avg, but have ben 140/90 or since getting on sudafed for sinus sx's lately.  Past Medical History:  Diagnosis Date  . Anxiety    prn xanax started 01/2019->self d/c'd in fall/winter 2020  . Barrett's esophagus    Bx+ 2007, 2009, and 2020.  Marland Kitchen Chronic constipation    GI->Linzess trial 05/2019->helpful  . Contact dermatitis and other eczema, due to unspecified cause    +scarring  . Diverticulosis 2021   noted on CT abd 06/2020  . Epididymal cyst 06/2018   Bilat: small on R, but 4 cm on left  Asymptomatic.  Marland Kitchen GERD (gastroesophageal reflux disease)   . History of adenomatous polyp of colon 08/10/2006; 01/2020   2007 and 12/2018; 01/2020, recall 2024  . Hyperlipidemia   . Immunization series complete    has had both doses pfizer covid 19 vaccine  . Incisional hernia   . Left foot pain    ? Arch strain.  Some numbness to suggest possible tarsal tunnel syndrome vs lumbar radiculitis (sports med MD).   Responded very well to custom orthotic.  . Migraine syndrome    verapamil prophylaxis, cluster migraine  . Seasonal and perennial allergic rhinitis    vaccine/injection therapy helpful  . Small bowel obstruction ([place]) Fall of 2019   surgery->lysis of adhesions  . Vertebral compression fracture ([place]) 05/2019   T11 and T12 and L 1, minimal trauma.  Dr. Darene Lamer put him on fosamax.    Past Surgical History:  Procedure Laterality Date  . ABDOMINAL EXPLORATION SURGERY  Fall 2019   SBO->lysis of adhesions  . COLONOSCOPY W/ POLYPECTOMY  08/10/06; 12/2018   Several adenomatous polyps 2007 and 2020; 01-25-2020. recall 2024  . DEXA  05/24/2019   Dr. Bartholome Bill -2.4 + vert comp fx->fosamax started.  . ESOPHAGOGASTRODUODENOSCOPY  2007 and 07/20/08;12/2018   GERD, BARRET's esoph (Dr. Henrene Pastor). Same 12/2018.  Marland Kitchen INCISIONAL HERNIA REPAIR N/A 02/08/2020   Procedure: LAPAROSCOPIC INCISIONAL HERNIA REPAIR WITH MESH;  Surgeon: Kinsinger, Arta Bruce, MD;  Location: [facility];  Service: General;  Laterality: N/A;  . LE venous doppler u/s  04/19/15   L leg NORMAL  . right knee arthroscopy  1980s   Torn meniscus (Dr. Noemi Chapel)    Family History  Problem Relation Age of Onset  . Other Father        chemical PNA siphoning gas  . Healthy Brother   .  Colon cancer Neg Hx   . Esophageal cancer Neg Hx   . Rectal cancer Neg Hx   . Stomach cancer Neg Hx     Social History   Socioeconomic History  . Marital status: Married    Spouse name: Not on file  . Number of children: 1  . Years of education: Not on file  . Highest education level: Not on file  Occupational History  . Occupation: Designer, jewellery: Education administrator  Tobacco Use  . Smoking status: Former Smoker    Packs/day: 1.00    Years: 10.00    Pack years: 10.00    Types: Cigarettes    Quit date: 07/06/1993    Years since quitting: 27.0  . Smokeless tobacco: Never Used  Vaping Use  . Vaping Use:  Never used  Substance and Sexual Activity  . Alcohol use: Yes    Comment: 1 or 2 daily  . Drug use: No  . Sexual activity: Not on file  Other Topics Concern  . Not on file  Social History Narrative   Married, one daughter.   Educ: [employer]   Occup: Biochemist, clinical for [employer].    No tobacco, 2 beers a night, no hx of problem drinking/drug use.   Caffeine: 2-3 cups coffee every morning.  Tea x 2 later in day.   Social Determinants of Health   Financial Resource Strain:   . Difficulty of Paying Living Expenses: Not on file  Food Insecurity:   . Worried About Charity fundraiser in the Last Year: Not on file  . Ran Out of Food in the Last Year: Not on file  Transportation Needs:   . Lack of Transportation (Medical): Not on file  . Lack of Transportation (Non-Medical): Not on file  Physical Activity:   . Days of Exercise per Week: Not on file  . Minutes of Exercise per Session: Not on file  Stress:   . Feeling of Stress : Not on file  Social Connections:   . Frequency of Communication with Friends and Family: Not on file  . Frequency of Social Gatherings with Friends and Family: Not on file  . Attends Religious Services: Not on file  . Active Member of Clubs or Organizations: Not on file  . Attends Archivist Meetings: Not on file  . Marital Status: Not on file  Intimate Partner Violence:   . Fear of Current or Ex-Partner: Not on file  . Emotionally Abused: Not on file  . Physically Abused: Not on file  . Sexually Abused: Not on file    Outpatient Medications Prior to Visit  Medication Sig Dispense Refill  . alendronate (FOSAMAX) 70 MG tablet TAKE 1 TABLET (70 MG TOTAL) BY MOUTH EVERY 7 (SEVEN) DAYS. 12 tablet 3  . atorvastatin (LIPITOR) 40 MG tablet Take 1 tablet (40 mg total) by mouth daily. 90 tablet 3  . Calcium Carbonate-Vitamin D 600-400 MG-UNIT tablet Take 1 tablet by mouth 2 (two) times daily. (Patient taking differently: Take 1 tablet by mouth daily. )  60 tablet 11  . cyclobenzaprine (FLEXERIL) 10 MG tablet Take 1 tablet (10 mg total) by mouth 2 (two) times daily as needed. 30 tablet 3  . dicyclomine (BENTYL) 10 MG capsule Take 1 capsule (10 mg total) by mouth 2 (two) times daily. 60 capsule 3  . fluticasone (FLONASE) 50 MCG/ACT nasal spray Place 2 sprays into both nostrils daily. 16 g 11  .  ibuprofen (ADVIL) 400 MG tablet Take 1-2 tablets (400-800 mg total) by mouth every 8 (eight) hours as needed. 30 tablet 0  . ipratropium (ATROVENT) 0.06 % nasal spray Place 2 sprays into both nostrils 4 (four) times daily. (Patient taking differently: Place 2 sprays into both nostrils in the morning and at bedtime. ) 15 mL 1  . lansoprazole (PREVACID) 30 MG capsule TAKE 1 CAPSULE BY MOUTH TWICE A DAY 60 capsule 2  . linaclotide (LINZESS) 290 MCG CAPS capsule Take 290 mcg by mouth daily before breakfast.    . polyethylene glycol powder (GLYCOLAX/MIRALAX) 17 GM/SCOOP powder Take by mouth.    . pseudoephedrine (SUDAFED) 30 MG tablet Take by mouth.    . verapamil (CALAN) 120 MG tablet Take 60 mg by mouth once. For migraines    . doxycycline (VIBRAMYCIN) 100 MG capsule Take 1 capsule (100 mg total) by mouth 2 (two) times daily. Take with food. 14 capsule 0   No facility-administered medications prior to visit.    Allergies  Allergen Reactions  . Oxycodone     nausea    ROS Review of Systems  Constitutional: Negative for appetite change, chills, fatigue and fever.  HENT: Negative for congestion, dental problem, ear pain and sore throat.   Eyes: Negative for discharge, redness and visual disturbance.  Respiratory: Negative for cough, chest tightness, shortness of breath and wheezing.   Cardiovascular: Negative for chest pain, palpitations and leg swelling.  Gastrointestinal: Negative for abdominal pain, blood in stool, diarrhea, nausea and vomiting.  Genitourinary: Negative for difficulty urinating, dysuria, flank pain, frequency, hematuria and urgency.   Musculoskeletal: Negative for arthralgias, back pain, joint swelling, myalgias and neck stiffness.  Skin: Negative for pallor and rash.  Neurological: Negative for dizziness, speech difficulty, weakness and headaches.  Hematological: Negative for adenopathy. Does not bruise/bleed easily.  Psychiatric/Behavioral: Negative for confusion and sleep disturbance. The patient is not nervous/anxious.     PE; Vitals with BMI 07/12/2020 06/19/2020 06/17/2020  Height 5' 8.5" 5\' 11"  -  Weight 149 lbs 149 lbs -  BMI 88.41 66.06 -  Systolic 301 601 093  Diastolic 93 70 89  Pulse 94 97 85    Gen: Alert, well appearing.  Patient is oriented to person, place, time, and situation. AFFECT: pleasant, lucid thought and speech. ENT: Ears: EACs clear, normal epithelium.  TMs with good light reflex and landmarks bilaterally.  Eyes: no injection, icteris, swelling, or exudate.  EOMI, PERRLA. Nose: no drainage or turbinate edema/swelling.  No injection or focal lesion.  Mouth: lips without lesion/swelling.  Oral mucosa pink and moist.  Dentition intact and without obvious caries or gingival swelling.  Oropharynx without erythema, exudate, or swelling.  Neck: supple/nontender.  No LAD, mass, or TM.  Carotid pulses 2+ bilaterally, without bruits. CV: RRR, no m/r/g.   LUNGS: CTA bilat, nonlabored resps, good aeration in all lung fields. ABD: soft, NT, ND, BS normal.  No hepatospenomegaly or mass.  No bruits. EXT: no clubbing, cyanosis, or edema.  Musculoskeletal: no joint swelling, erythema, warmth, or tenderness.  ROM of all joints intact. Skin - no sores or suspicious lesions or rashes or color changes Rectal exam: negative without mass, lesions or tenderness, PROSTATE EXAM: smooth and symmetric without nodules or tenderness.   Pertinent labs:  Lab Results  Component Value Date   TSH 1.94 06/23/2018   Lab Results  Component Value Date   WBC 6.1 06/29/2019   HGB 16.0 02/08/2020   HCT 47.0 02/08/2020  MCV 97.4 06/29/2019   PLT 320.0 06/29/2019   Lab Results  Component Value Date   CREATININE 1.02 05/30/2020   BUN 11 05/30/2020   NA 139 02/08/2020   K 3.9 02/08/2020   CL 102 02/08/2020   CO2 29 06/29/2019   Lab Results  Component Value Date   ALT 26 06/29/2019   AST 23 06/29/2019   ALKPHOS 86 06/29/2019   BILITOT 0.7 06/29/2019   Lab Results  Component Value Date   CHOL 174 06/29/2019   Lab Results  Component Value Date   HDL 72.00 06/29/2019   Lab Results  Component Value Date   LDLCALC 92 06/29/2019   Lab Results  Component Value Date   TRIG 47.0 06/29/2019   Lab Results  Component Value Date   CHOLHDL 2 06/29/2019   Lab Results  Component Value Date   PSA 0.22 06/29/2019   PSA 0.17 06/23/2018   PSA 0.29 06/17/2017   ASSESSMENT AND PLAN:   Health maintenance exam: Reviewed age and gender appropriate health maintenance issues (prudent diet, regular exercise, health risks of tobacco and excessive alcohol, use of seatbelts, fire alarms in home, use of sunscreen).  Also reviewed age and gender appropriate health screening as well as vaccine recommendations. Vaccines: Tdap-->given today.  Pneumovax->pt deferred today.   FLu->pt deferred today.  Covid and shingrix are UTD. Labs: CBC w/diff, CMET, FLP (HLD), PSA. Prostate ca screening: DRE normal , PSA. Colon ca screening: hx polyps, recall 01/2023.  HLD: tolerating statin.  FLP and hepatic panel today.  Elev bp w/out dx htn: pt's bp consistently normal when NOT TAking sudafed. Encouraged him to LIMIT sudafed use as much as possible. Lytes/cr today. An After Visit Summary was printed and given to the patient.  FOLLOW UP:  Return in about 6 months (around 01/10/2021) for routine chronic illness f/u.  Signed:  Crissie Sickles, MD           07/12/2020

## 2020-07-15 ENCOUNTER — Encounter: Payer: Self-pay | Admitting: Family Medicine

## 2020-07-18 DIAGNOSIS — Z79899 Other long term (current) drug therapy: Secondary | ICD-10-CM | POA: Diagnosis not present

## 2020-07-18 DIAGNOSIS — K589 Irritable bowel syndrome without diarrhea: Secondary | ICD-10-CM | POA: Diagnosis not present

## 2020-07-18 DIAGNOSIS — H25811 Combined forms of age-related cataract, right eye: Secondary | ICD-10-CM | POA: Diagnosis not present

## 2020-07-18 DIAGNOSIS — M1990 Unspecified osteoarthritis, unspecified site: Secondary | ICD-10-CM | POA: Diagnosis not present

## 2020-07-18 DIAGNOSIS — H25813 Combined forms of age-related cataract, bilateral: Secondary | ICD-10-CM | POA: Diagnosis not present

## 2020-07-18 DIAGNOSIS — H2513 Age-related nuclear cataract, bilateral: Secondary | ICD-10-CM | POA: Diagnosis not present

## 2020-07-18 DIAGNOSIS — E785 Hyperlipidemia, unspecified: Secondary | ICD-10-CM | POA: Diagnosis not present

## 2020-07-18 DIAGNOSIS — J45909 Unspecified asthma, uncomplicated: Secondary | ICD-10-CM | POA: Diagnosis not present

## 2020-07-18 DIAGNOSIS — K219 Gastro-esophageal reflux disease without esophagitis: Secondary | ICD-10-CM | POA: Diagnosis not present

## 2020-07-19 DIAGNOSIS — H52202 Unspecified astigmatism, left eye: Secondary | ICD-10-CM | POA: Diagnosis not present

## 2020-07-19 DIAGNOSIS — H25812 Combined forms of age-related cataract, left eye: Secondary | ICD-10-CM | POA: Diagnosis not present

## 2020-07-19 DIAGNOSIS — Z961 Presence of intraocular lens: Secondary | ICD-10-CM | POA: Diagnosis not present

## 2020-07-19 DIAGNOSIS — H527 Unspecified disorder of refraction: Secondary | ICD-10-CM | POA: Diagnosis not present

## 2020-08-06 ENCOUNTER — Other Ambulatory Visit: Payer: Self-pay | Admitting: Gastroenterology

## 2020-08-06 ENCOUNTER — Other Ambulatory Visit: Payer: Self-pay | Admitting: Family Medicine

## 2020-08-06 DIAGNOSIS — M50323 Other cervical disc degeneration at C6-C7 level: Secondary | ICD-10-CM | POA: Diagnosis not present

## 2020-08-06 DIAGNOSIS — M50322 Other cervical disc degeneration at C5-C6 level: Secondary | ICD-10-CM | POA: Diagnosis not present

## 2020-08-06 DIAGNOSIS — M9901 Segmental and somatic dysfunction of cervical region: Secondary | ICD-10-CM | POA: Diagnosis not present

## 2020-08-06 DIAGNOSIS — M50321 Other cervical disc degeneration at C4-C5 level: Secondary | ICD-10-CM | POA: Diagnosis not present

## 2020-09-03 DIAGNOSIS — Z961 Presence of intraocular lens: Secondary | ICD-10-CM | POA: Diagnosis not present

## 2020-09-03 DIAGNOSIS — H25812 Combined forms of age-related cataract, left eye: Secondary | ICD-10-CM | POA: Diagnosis not present

## 2020-09-03 DIAGNOSIS — J45909 Unspecified asthma, uncomplicated: Secondary | ICD-10-CM | POA: Diagnosis not present

## 2020-09-03 DIAGNOSIS — H524 Presbyopia: Secondary | ICD-10-CM | POA: Insufficient documentation

## 2020-09-03 DIAGNOSIS — M1990 Unspecified osteoarthritis, unspecified site: Secondary | ICD-10-CM | POA: Diagnosis not present

## 2020-09-03 DIAGNOSIS — G43909 Migraine, unspecified, not intractable, without status migrainosus: Secondary | ICD-10-CM | POA: Diagnosis not present

## 2020-09-03 DIAGNOSIS — Z79899 Other long term (current) drug therapy: Secondary | ICD-10-CM | POA: Diagnosis not present

## 2020-09-03 DIAGNOSIS — H527 Unspecified disorder of refraction: Secondary | ICD-10-CM | POA: Diagnosis not present

## 2020-09-03 DIAGNOSIS — K219 Gastro-esophageal reflux disease without esophagitis: Secondary | ICD-10-CM | POA: Diagnosis not present

## 2020-09-03 DIAGNOSIS — E785 Hyperlipidemia, unspecified: Secondary | ICD-10-CM | POA: Diagnosis not present

## 2020-09-03 DIAGNOSIS — H2513 Age-related nuclear cataract, bilateral: Secondary | ICD-10-CM | POA: Diagnosis not present

## 2020-09-03 DIAGNOSIS — Z7951 Long term (current) use of inhaled steroids: Secondary | ICD-10-CM | POA: Diagnosis not present

## 2020-09-03 DIAGNOSIS — H52202 Unspecified astigmatism, left eye: Secondary | ICD-10-CM | POA: Diagnosis not present

## 2020-09-03 DIAGNOSIS — K589 Irritable bowel syndrome without diarrhea: Secondary | ICD-10-CM | POA: Diagnosis not present

## 2020-09-03 DIAGNOSIS — Z9841 Cataract extraction status, right eye: Secondary | ICD-10-CM | POA: Diagnosis not present

## 2020-09-03 HISTORY — PX: INTRAOCULAR LENS INSERTION: SHX110

## 2020-09-04 DIAGNOSIS — Z961 Presence of intraocular lens: Secondary | ICD-10-CM | POA: Diagnosis not present

## 2020-09-04 DIAGNOSIS — H527 Unspecified disorder of refraction: Secondary | ICD-10-CM | POA: Diagnosis not present

## 2020-09-09 ENCOUNTER — Other Ambulatory Visit: Payer: Self-pay | Admitting: Sports Medicine

## 2020-09-09 DIAGNOSIS — M8000XD Age-related osteoporosis with current pathological fracture, unspecified site, subsequent encounter for fracture with routine healing: Secondary | ICD-10-CM

## 2020-09-24 ENCOUNTER — Encounter: Payer: Self-pay | Admitting: Family Medicine

## 2020-09-25 ENCOUNTER — Other Ambulatory Visit: Payer: Self-pay | Admitting: Gastroenterology

## 2020-10-11 ENCOUNTER — Other Ambulatory Visit: Payer: Self-pay | Admitting: Family Medicine

## 2020-10-15 NOTE — Telephone Encounter (Signed)
RF request for flexeril LOV: 07/12/20 Next ov: 01/10/21 Last written: 04/22/20 (30,3)

## 2020-11-28 DIAGNOSIS — M50321 Other cervical disc degeneration at C4-C5 level: Secondary | ICD-10-CM | POA: Diagnosis not present

## 2020-11-28 DIAGNOSIS — M50322 Other cervical disc degeneration at C5-C6 level: Secondary | ICD-10-CM | POA: Diagnosis not present

## 2020-11-28 DIAGNOSIS — M9901 Segmental and somatic dysfunction of cervical region: Secondary | ICD-10-CM | POA: Diagnosis not present

## 2020-11-28 DIAGNOSIS — M50323 Other cervical disc degeneration at C6-C7 level: Secondary | ICD-10-CM | POA: Diagnosis not present

## 2020-12-16 DIAGNOSIS — G44009 Cluster headache syndrome, unspecified, not intractable: Secondary | ICD-10-CM | POA: Diagnosis not present

## 2021-01-02 DIAGNOSIS — M50323 Other cervical disc degeneration at C6-C7 level: Secondary | ICD-10-CM | POA: Diagnosis not present

## 2021-01-02 DIAGNOSIS — M50322 Other cervical disc degeneration at C5-C6 level: Secondary | ICD-10-CM | POA: Diagnosis not present

## 2021-01-02 DIAGNOSIS — M50321 Other cervical disc degeneration at C4-C5 level: Secondary | ICD-10-CM | POA: Diagnosis not present

## 2021-01-02 DIAGNOSIS — M9901 Segmental and somatic dysfunction of cervical region: Secondary | ICD-10-CM | POA: Diagnosis not present

## 2021-01-10 ENCOUNTER — Ambulatory Visit: Payer: BC Managed Care – PPO | Admitting: Family Medicine

## 2021-01-31 ENCOUNTER — Ambulatory Visit: Payer: BC Managed Care – PPO | Admitting: Family Medicine

## 2021-01-31 ENCOUNTER — Other Ambulatory Visit: Payer: Self-pay | Admitting: Family Medicine

## 2021-01-31 ENCOUNTER — Other Ambulatory Visit: Payer: Self-pay

## 2021-01-31 ENCOUNTER — Encounter: Payer: Self-pay | Admitting: Family Medicine

## 2021-01-31 VITALS — BP 139/93 | HR 92 | Temp 97.6°F | Wt 152.2 lb

## 2021-01-31 DIAGNOSIS — Z23 Encounter for immunization: Secondary | ICD-10-CM

## 2021-01-31 DIAGNOSIS — R030 Elevated blood-pressure reading, without diagnosis of hypertension: Secondary | ICD-10-CM | POA: Diagnosis not present

## 2021-01-31 DIAGNOSIS — E785 Hyperlipidemia, unspecified: Secondary | ICD-10-CM | POA: Diagnosis not present

## 2021-01-31 DIAGNOSIS — M810 Age-related osteoporosis without current pathological fracture: Secondary | ICD-10-CM

## 2021-01-31 LAB — COMPREHENSIVE METABOLIC PANEL
ALT: 27 U/L (ref 0–53)
AST: 23 U/L (ref 0–37)
Albumin: 4.5 g/dL (ref 3.5–5.2)
Alkaline Phosphatase: 62 U/L (ref 39–117)
BUN: 9 mg/dL (ref 6–23)
CO2: 28 mEq/L (ref 19–32)
Calcium: 10 mg/dL (ref 8.4–10.5)
Chloride: 101 mEq/L (ref 96–112)
Creatinine, Ser: 0.85 mg/dL (ref 0.40–1.50)
GFR: 90.62 mL/min (ref >60.00)
Glucose, Bld: 78 mg/dL (ref 70–99)
Potassium: 4.3 mEq/L (ref 3.5–5.1)
Sodium: 136 mEq/L (ref 135–145)
Total Bilirubin: 0.6 mg/dL (ref 0.2–1.2)
Total Protein: 7 g/dL (ref 6.0–8.3)

## 2021-01-31 LAB — LIPID PANEL
Cholesterol: 160 mg/dL (ref 0–200)
HDL: 72.7 mg/dL (ref >39.00)
LDL Cholesterol: 76 mg/dL (ref 0–99)
NonHDL: 87.39
Total CHOL/HDL Ratio: 2
Triglycerides: 56 mg/dL (ref 0.0–149.0)
VLDL: 11.2 mg/dL (ref 0.0–40.0)

## 2021-01-31 LAB — VITAMIN D 25 HYDROXY (VIT D DEFICIENCY, FRACTURES): VITD: 40.56 ng/mL (ref 30.00–100.00)

## 2021-01-31 NOTE — Patient Instructions (Signed)

## 2021-01-31 NOTE — Progress Notes (Signed)
OFFICE VISIT  01/31/2021  CC:  Chief Complaint  Patient presents with  . Follow-up    6 month   HPI:    Patient is a 65 y.o. Caucasian male who presents for 6 mo f/u HLD, osteoporosis with hx of vert comp fx, and elev bp w/out dx of HTN. A/P as of last visit: "Health maintenance exam: Reviewed age and gender appropriate health maintenance issues (prudent diet, regular exercise, health risks of tobacco and excessive alcohol, use of seatbelts, fire alarms in home, use of sunscreen).  Also reviewed age and gender appropriate health screening as well as vaccine recommendations. Vaccines: Tdap-->given today.  Pneumovax->pt deferred today.   FLu->pt deferred today.  Covid and shingrix are UTD. Labs: CBC w/diff, CMET, FLP (HLD), PSA. Prostate ca screening: DRE normal , PSA. Colon ca screening: hx polyps, recall 01/2023.  HLD: tolerating atorvastatin 40mg  qd.  FLP and hepatic panel today.  Elev bp w/out dx htn: pt's bp consistently normal when NOT TAking sudafed. Encouraged him to LIMIT sudafed use as much as possible. Lytes/cr today."  INTERIM HX: Feeling fine but a bit flustered today from rushing around already, etc. Admits to eating high fat meats and drinks 1 pepsi per day.  Sugar in coffee. Exercise: walking regularly.  Yard work.  HLD: atorva 40mg  qd, LDL was 78 six mo ago.  Osteoporosis: taking alendrate weekly as rx'd, also on Ca+D supplement.  Elev bp w/out dx htn:  Home bp monitoring consistently 120s/80-90 but only a couple checks over the last 6 mo.  [facility]: [facility]->still taking verapamil for prophylaxis and helpful.  ROS as above, plus--> no fevers, no CP, no SOB, no wheezing, no cough, no dizziness, no HAs, no rashes, no melena/hematochezia.  No polyuria or polydipsia.  No myalgias or arthralgias.  No focal weakness, paresthesias, or tremors.  No acute vision or hearing abnormalities.  No dysuria or unusual/new urinary urgency or frequency.  No recent  changes in lower legs. No n/v/d or abd pain.  No palpitations.     Past Medical History:  Diagnosis Date  . Anxiety    prn xanax started 01/2019->self d/c'd in fall/winter 2020  . Barrett's esophagus    Bx+ 2007, 2009, and 2020.  Marland Kitchen Chronic constipation    GI->Linzess trial 05/2019->helpful  . Contact dermatitis and other eczema, due to unspecified cause    +scarring  . Diverticulosis 2021   noted on CT abd 06/2020  . Epididymal cyst 06/2018   Bilat: small on R, but 4 cm on left  Asymptomatic.  Marland Kitchen GERD (gastroesophageal reflux disease)   . History of adenomatous polyp of colon 08/10/2006; 01/2020   2007 and 12/2018; 01/2020, recall 2024  . Hyperlipidemia   . Incisional hernia   . Left foot pain    ? Arch strain.  Some numbness to suggest possible tarsal tunnel syndrome vs lumbar radiculitis (sports med MD).  Responded very well to custom orthotic.  . Migraine syndrome    verapamil prophylaxis, cluster migraine  . Pseudophakia of both eyes   . Seasonal and perennial allergic rhinitis    vaccine/injection therapy helpful  . Small bowel obstruction ([place]) Fall of 2019   surgery->lysis of adhesions  . Vertebral compression fracture ([place]) 05/2019   T11 and T12 and L 1, minimal trauma.  Dr. Darene Lamer put him on fosamax.    Past Surgical History:  Procedure Laterality Date  . ABDOMINAL EXPLORATION SURGERY  Fall 2019   SBO->lysis of adhesions  .  COLONOSCOPY W/ POLYPECTOMY  08/10/06; 12/2018   Several adenomatous polyps 2007 and 2020; 01-25-2020. recall 2024  . DEXA  05/24/2019   Dr. Bartholome Bill -2.4 + vert comp fx->fosamax started.  . ESOPHAGOGASTRODUODENOSCOPY  2007 and 07/20/08;12/2018   GERD, BARRET's esoph (Dr. Henrene Pastor). Same 12/2018.  Marland Kitchen INCISIONAL HERNIA REPAIR N/A 02/08/2020   Procedure: LAPAROSCOPIC INCISIONAL HERNIA REPAIR WITH MESH;  Surgeon: Kinsinger, Arta Bruce, MD;  Location: [place];  Service: General;  Laterality: N/A;  . INTRAOCULAR LENS INSERTION  09/03/2020   OU  .  LE venous doppler u/s  04/19/15   L leg NORMAL  . right knee arthroscopy  1980s   Torn meniscus (Dr. Noemi Chapel)    Outpatient Medications Prior to Visit  Medication Sig Dispense Refill  . alendronate (FOSAMAX) 70 MG tablet TAKE 1 TABLET (70 MG TOTAL) BY MOUTH EVERY 7 (SEVEN) DAYS. 12 tablet 3  . atorvastatin (LIPITOR) 40 MG tablet Take 1 tablet (40 mg total) by mouth daily. 90 tablet 3  . CALCIUM + VITAMIN D3 600-10 MG-MCG tablet TAKE 1 TABLET BY MOUTH TWICE A DAY 180 tablet 3  . cyclobenzaprine (FLEXERIL) 10 MG tablet TAKE 1 TABLET (10 MG TOTAL) BY MOUTH 2 (TWO) TIMES DAILY AS NEEDED. 30 tablet 3  . dicyclomine (BENTYL) 10 MG capsule TAKE 1 CAPSULE (10 MG TOTAL) BY MOUTH 2 (TWO) TIMES DAILY. 180 capsule 1  . fluticasone (FLONASE) 50 MCG/ACT nasal spray Place 2 sprays into both nostrils daily. 16 g 11  . ibuprofen (ADVIL) 400 MG tablet Take 1-2 tablets (400-800 mg total) by mouth every 8 (eight) hours as needed. 30 tablet 0  . ipratropium (ATROVENT) 0.06 % nasal spray Place 2 sprays into both nostrils 4 (four) times daily. (Patient taking differently: Place 2 sprays into both nostrils in the morning and at bedtime.) 15 mL 1  . lansoprazole (PREVACID) 30 MG capsule TAKE 1 CAPSULE BY MOUTH TWICE A DAY 180 capsule 1  . LINZESS 290 MCG CAPS capsule TAKE 1 CAPSULE (290 MCG TOTAL) BY MOUTH DAILY IN PM 90 capsule 1  . polyethylene glycol powder (GLYCOLAX/MIRALAX) 17 GM/SCOOP powder Take by mouth.    . pseudoephedrine (SUDAFED) 30 MG tablet Take by mouth.    . verapamil (CALAN) 120 MG tablet Take 60 mg by mouth once. For migraines     No facility-administered medications prior to visit.    Allergies  Allergen Reactions  . Oxycodone     nausea    ROS As per HPI  PE: Vitals with BMI 01/31/2021 07/12/2020 06/19/2020  Height - 5' 8.5" 5\' 11"   Weight 152 lbs 3 oz 149 lbs 149 lbs  BMI - 25.42 70.62  Systolic 376 283 151  Diastolic 93 93 70  Pulse 92 94 97     Gen: Alert, well appearing.   Patient is oriented to person, place, time, and situation. AFFECT: pleasant, lucid thought and speech. CV: RRR, no m/r/g.   LUNGS: CTA bilat, nonlabored resps, good aeration in all lung fields. EXT: no clubbing or cyanosis.  no edema.    LABS:  Lab Results  Component Value Date   TSH 1.94 06/23/2018   Lab Results  Component Value Date   WBC 5.1 07/12/2020   HGB 14.5 07/12/2020   HCT 43.1 07/12/2020   MCV 97.8 07/12/2020   PLT 345.0 07/12/2020   Lab Results  Component Value Date   CREATININE 0.87 07/12/2020   BUN 6 07/12/2020   NA 140 07/12/2020   K 5.2  No hemolysis seen (H) 07/12/2020   CL 103 07/12/2020   CO2 31 07/12/2020   Lab Results  Component Value Date   ALT 32 07/12/2020   AST 24 07/12/2020   ALKPHOS 58 07/12/2020   BILITOT 0.6 07/12/2020   Lab Results  Component Value Date   CHOL 158 07/12/2020   Lab Results  Component Value Date   HDL 72.70 07/12/2020   Lab Results  Component Value Date   LDLCALC 78 07/12/2020   Lab Results  Component Value Date   TRIG 39.0 07/12/2020   Lab Results  Component Value Date   CHOLHDL 2 07/12/2020   Lab Results  Component Value Date   PSA 0.16 07/12/2020   PSA 0.22 06/29/2019   PSA 0.17 06/23/2018   IMPRESSION AND PLAN:  1) HLD: tolerating atorva 40mg  qd. FLP and hepatic panel today.  2) Elev bp w/out dx htn: suspect he does have mild HTN but he wants to avoid med if at all possible, prefers to start checking bp at least weekly at home and knows to call or return if bp consistently > 140/90, o/w we'll review the bp's at next f/u in 6 mo. DASH diet reviewed/handout given today. Lytes/cr today.  3) Osteoporosis with hx of vertebral fx: tolerating alendronate.  Sounds like he takes his Ca and vit D supp infrequently b/c afraid it may cause him to have more migraines. Checking vit D and Ca++ today.  An After Visit Summary was printed and given to the patient.  FOLLOW UP: Return in about 6 months (around  08/02/2021) for annual CPE (fasting).  Signed:  Crissie Sickles, MD           01/31/2021

## 2021-02-04 ENCOUNTER — Other Ambulatory Visit: Payer: Self-pay | Admitting: Gastroenterology

## 2021-02-04 ENCOUNTER — Other Ambulatory Visit: Payer: Self-pay | Admitting: Family Medicine

## 2021-02-13 DIAGNOSIS — M9901 Segmental and somatic dysfunction of cervical region: Secondary | ICD-10-CM | POA: Diagnosis not present

## 2021-02-13 DIAGNOSIS — M50321 Other cervical disc degeneration at C4-C5 level: Secondary | ICD-10-CM | POA: Diagnosis not present

## 2021-02-13 DIAGNOSIS — M50323 Other cervical disc degeneration at C6-C7 level: Secondary | ICD-10-CM | POA: Diagnosis not present

## 2021-02-13 DIAGNOSIS — M50322 Other cervical disc degeneration at C5-C6 level: Secondary | ICD-10-CM | POA: Diagnosis not present

## 2021-03-14 ENCOUNTER — Other Ambulatory Visit: Payer: Self-pay | Admitting: Sports Medicine

## 2021-03-14 DIAGNOSIS — M8000XD Age-related osteoporosis with current pathological fracture, unspecified site, subsequent encounter for fracture with routine healing: Secondary | ICD-10-CM

## 2021-03-17 ENCOUNTER — Telehealth: Payer: Self-pay

## 2021-03-17 NOTE — Telephone Encounter (Signed)
Left voicemail for patient to call back to get appt scheduled for med refils. AM

## 2021-03-17 NOTE — Telephone Encounter (Signed)
Patient needs an appt for med refills. A virtual appt is OK. Please call patient to schedule.

## 2021-03-20 DIAGNOSIS — M50321 Other cervical disc degeneration at C4-C5 level: Secondary | ICD-10-CM | POA: Diagnosis not present

## 2021-03-20 DIAGNOSIS — M50322 Other cervical disc degeneration at C5-C6 level: Secondary | ICD-10-CM | POA: Diagnosis not present

## 2021-03-20 DIAGNOSIS — M9901 Segmental and somatic dysfunction of cervical region: Secondary | ICD-10-CM | POA: Diagnosis not present

## 2021-03-20 DIAGNOSIS — M50323 Other cervical disc degeneration at C6-C7 level: Secondary | ICD-10-CM | POA: Diagnosis not present

## 2021-03-21 ENCOUNTER — Ambulatory Visit: Payer: BC Managed Care – PPO | Admitting: Sports Medicine

## 2021-03-21 ENCOUNTER — Other Ambulatory Visit: Payer: Self-pay

## 2021-03-21 DIAGNOSIS — M8000XD Age-related osteoporosis with current pathological fracture, unspecified site, subsequent encounter for fracture with routine healing: Secondary | ICD-10-CM | POA: Diagnosis not present

## 2021-03-21 DIAGNOSIS — M720 Palmar fascial fibromatosis [Dupuytren]: Secondary | ICD-10-CM | POA: Diagnosis not present

## 2021-03-21 DIAGNOSIS — S22000D Wedge compression fracture of unspecified thoracic vertebra, subsequent encounter for fracture with routine healing: Secondary | ICD-10-CM

## 2021-03-21 MED ORDER — CALCIUM CARBONATE-VITAMIN D3 600-400 MG-UNIT PO TABS: 1.0000 | ORAL_TABLET | Freq: Two times a day (BID) | ORAL | 3 refills | Status: AC

## 2021-03-21 MED ORDER — ALENDRONATE SODIUM 70 MG PO TABS: 70.0000 mg | ORAL_TABLET | ORAL | 3 refills | Status: DC

## 2021-03-21 NOTE — Assessment & Plan Note (Signed)
T12 and L1 compression fractures are stable on the CT scan from 2021, stability over a year implies that we do not need to follow this anymore.

## 2021-03-21 NOTE — Assessment & Plan Note (Signed)
Very mild, no intervention needed.

## 2021-03-21 NOTE — Progress Notes (Signed)
    Procedures performed today:    None.  Independent interpretation of notes and tests performed by another provider:   None.  Brief History, Exam, Impression, and Recommendations:    Thoracic compression fracture (HCC) T12 and L1 compression fractures are stable on the CT scan from 2021, stability over a year implies that we do not need to follow this anymore.  Osteoporosis Last bone density test showed a T score of -2.4, combined with a vertebral compression fracture we made the diagnosis of osteoporosis, started Fosamax and calcium/vitamin D, continue this, we will get an updated bone density test. Return as needed.  Dupuytren's contracture of both hands Very mild, no intervention needed.    ___________________________________________ Gwen Her. Dianah Field, M.D., ABFM., CAQSM. Primary Care and [place] Instructor of [place] [facility]

## 2021-03-21 NOTE — Assessment & Plan Note (Signed)
Last bone density test showed a T score of -2.4, combined with a vertebral compression fracture we made the diagnosis of osteoporosis, started Fosamax and calcium/vitamin D, continue this, we will get an updated bone density test. Return as needed.

## 2021-03-24 ENCOUNTER — Telehealth: Payer: Self-pay

## 2021-03-24 NOTE — Telephone Encounter (Signed)
Patient is requesting to have his xrays that show his compression fractures printed out so he can show his employer.

## 2021-03-24 NOTE — Telephone Encounter (Signed)
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  He can just go down to imaging and request a disc burned for free.  It will definitely be clearer than just having a low quality laser printer job.

## 2021-03-24 NOTE — Telephone Encounter (Signed)
Patient aware of this option. Called Imaging and requested they get the disc ready for patient pick up. Patient aware.

## 2021-04-17 DIAGNOSIS — M9901 Segmental and somatic dysfunction of cervical region: Secondary | ICD-10-CM | POA: Diagnosis not present

## 2021-04-17 DIAGNOSIS — M50321 Other cervical disc degeneration at C4-C5 level: Secondary | ICD-10-CM | POA: Diagnosis not present

## 2021-04-17 DIAGNOSIS — M50323 Other cervical disc degeneration at C6-C7 level: Secondary | ICD-10-CM | POA: Diagnosis not present

## 2021-04-17 DIAGNOSIS — M50322 Other cervical disc degeneration at C5-C6 level: Secondary | ICD-10-CM | POA: Diagnosis not present

## 2021-05-01 DIAGNOSIS — H10523 Angular blepharoconjunctivitis, bilateral: Secondary | ICD-10-CM | POA: Diagnosis not present

## 2021-05-14 ENCOUNTER — Other Ambulatory Visit: Payer: Self-pay | Admitting: Gastroenterology

## 2021-05-21 ENCOUNTER — Other Ambulatory Visit: Payer: Self-pay | Admitting: Family Medicine

## 2021-05-28 ENCOUNTER — Other Ambulatory Visit: Payer: Self-pay

## 2021-05-28 ENCOUNTER — Ambulatory Visit (INDEPENDENT_AMBULATORY_CARE_PROVIDER_SITE_OTHER): Payer: BC Managed Care – PPO

## 2021-05-28 DIAGNOSIS — M8000XD Age-related osteoporosis with current pathological fracture, unspecified site, subsequent encounter for fracture with routine healing: Secondary | ICD-10-CM

## 2021-05-29 DIAGNOSIS — M50321 Other cervical disc degeneration at C4-C5 level: Secondary | ICD-10-CM | POA: Diagnosis not present

## 2021-05-29 DIAGNOSIS — M50322 Other cervical disc degeneration at C5-C6 level: Secondary | ICD-10-CM | POA: Diagnosis not present

## 2021-05-29 DIAGNOSIS — M50323 Other cervical disc degeneration at C6-C7 level: Secondary | ICD-10-CM | POA: Diagnosis not present

## 2021-05-29 DIAGNOSIS — M9901 Segmental and somatic dysfunction of cervical region: Secondary | ICD-10-CM | POA: Diagnosis not present

## 2021-05-30 DIAGNOSIS — M8589 Other specified disorders of bone density and structure, multiple sites: Secondary | ICD-10-CM | POA: Diagnosis not present

## 2021-06-02 NOTE — Telephone Encounter (Signed)
That is listed in the radiology report, he needs to contact [facility] imaging and let them know they need to addend the report, thats separate from [place].

## 2021-06-11 ENCOUNTER — Ambulatory Visit: Payer: BC Managed Care – PPO | Admitting: Physician Assistant

## 2021-06-11 ENCOUNTER — Encounter: Payer: Self-pay | Admitting: Physician Assistant

## 2021-06-11 VITALS — BP 140/70 | HR 94 | Ht 68.5 in | Wt 149.1 lb

## 2021-06-11 DIAGNOSIS — K5909 Other constipation: Secondary | ICD-10-CM | POA: Diagnosis not present

## 2021-06-11 NOTE — Patient Instructions (Addendum)
Try Linzess 145 mcg daily before breakfast. Call with update after 1 weeks, ask for [facility].   We have sent the following medications to your pharmacy for you to pick up at your convenience: Linzess 290 mcg daily before breakfast,   If you are age 67 or older, your body mass index should be between 23-30. Your Body mass index is 22.34 kg/m. If this is out of the aforementioned range listed, please consider follow up with your Primary Care Provider.  If you are age 30 or younger, your body mass index should be between 19-25. Your Body mass index is 22.34 kg/m. If this is out of the aformentioned range listed, please consider follow up with your Primary Care Provider.   __________________________________________________________  The [place] [facility] providers would like to encourage you to use [facility] to communicate with providers for non-urgent requests or questions.  Due to long hold times on the telephone, sending your provider a message by [facility] may be a faster and more efficient way to get a response.  Please allow 48 business hours for a response.  Please remember that this is for non-urgent requests.

## 2021-06-11 NOTE — Progress Notes (Signed)
Noted  

## 2021-06-11 NOTE — Progress Notes (Signed)
Chief Complaint: Constipation  HPI:    Jacob Blanchard is a 65 year old male with a past medical history as listed below, known to Dr. Henrene Pastor, who presents to clinic today for follow-up of his constipation.    01/2020 colonoscopy with for 1-2 mm polyps in the descending and ascending colon, diverticulosis in the sigmoid colon otherwise normal.  Pathology showed mixture of tubular adenomas and hyperplastic polyps.    06/19/2020 patient seen in clinic by Alonza Bogus.  At that time his history of small bowel obstruction related to adhesions was noted.  Described he had an ex lap with lysis of adhesions in September 2019 and was admitted to the hospital again May 2020 at [facility] for 3 days with partial small bowel obstruction.  Had subsequent CT enterography in June 2020 that showed no bowel issues except for large amount of stool throughout the colon.  At that time patient was continued on Linzess 290 mcg daily and a little prune juice.  Was told to start a fiber supplement.    Today, the patient presents to clinic and tells me he has stopped the MiraLAX and the fiber because it made it feel like his Linzess was not working as well.  Currently, and over the past few years, he typically wakes up between 1 to 3:00 in the morning and will take his dose of Linzess 290 mcg.  Within 3 to 4 hours he is up and having multiple loose bowel movements "like I am cleaning out for a colonoscopy".  Tells me that it feels like it comes out in "sections", like there is a "tight spot" in there tells me he will have some loose stool then sit and relax and have some more loose stool.  Tells me that it is "impossible to think I am not cleaned out after all that", but throughout the day he may have some occasional right lower quadrant discomfort and feel like he may be needs to have a bowel movement again but he cannot.  He is wondering if he can decrease his dose of Linzess.  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    Denies fever, chills, weight loss, blood in his stool or symptoms that awaken him from sleep.  Past Medical History:  Diagnosis Date   Anxiety    prn xanax started 01/2019->self d/c'd in fall/winter 2020   Barrett's esophagus    Bx+ 2007, 2009, and 2020.   Chronic constipation    GI->Linzess trial 05/2019->helpful   Contact dermatitis and other eczema, due to unspecified cause    +scarring   Diverticulosis 2021   noted on CT abd 06/2020   Epididymal cyst 06/2018   Bilat: small on R, but 4 cm on left  Asymptomatic.   GERD (gastroesophageal reflux disease)    History of adenomatous polyp of colon 08/10/2006; 01/2020   2007 and 12/2018; 01/2020, recall 2024   Hyperlipidemia    Incisional hernia    Left foot pain    ? Arch strain.  Some numbness to suggest possible tarsal tunnel syndrome vs lumbar radiculitis (sports med MD).  Responded very well to custom orthotic.   Migraine syndrome    verapamil prophylaxis, cluster migraine   Pseudophakia of both  eyes    Seasonal and perennial allergic rhinitis    vaccine/injection therapy helpful   Small bowel obstruction ([place]) Fall of 2019   surgery->lysis of adhesions   Vertebral compression fracture ([place]) 05/2019   T11 and T12 and L 1, minimal trauma.  Dr. Darene Lamer put him on fosamax.    Past Surgical History:  Procedure Laterality Date   ABDOMINAL EXPLORATION SURGERY  Fall 2019   SBO->lysis of adhesions   COLONOSCOPY W/ POLYPECTOMY  08/10/06; 12/2018   Several adenomatous polyps 2007 and 2020; 01-25-2020. recall 2024   DEXA  05/24/2019   Dr. Bartholome Bill -2.4 + vert comp fx->fosamax started.   ESOPHAGOGASTRODUODENOSCOPY  2007 and 07/20/08;12/2018   GERD, BARRET's esoph (Dr. Henrene Pastor). Same 12/2018.   INCISIONAL HERNIA REPAIR N/A 02/08/2020   Procedure: LAPAROSCOPIC  INCISIONAL HERNIA REPAIR WITH MESH;  Surgeon: Kinsinger, Arta Bruce, MD;  Location: [place];  Service: General;  Laterality: N/A;   INTRAOCULAR LENS INSERTION  09/03/2020   OU   LE venous doppler u/s  04/19/15   L leg NORMAL   right knee arthroscopy  1980s   Torn meniscus (Dr. Noemi Chapel)    Current Outpatient Medications  Medication Sig Dispense Refill   alendronate (FOSAMAX) 70 MG tablet Take 1 tablet (70 mg total) by mouth every 7 (seven) days. 12 tablet 3   atorvastatin (LIPITOR) 40 MG tablet TAKE 1 TABLET BY MOUTH EVERY DAY 90 tablet 3   Calcium Carbonate-Vitamin D3 (CALCIUM + VITAMIN D3) 600-400 MG-UNIT TABS Take 1 tablet by mouth 2 (two) times daily. 180 tablet 3   cyclobenzaprine (FLEXERIL) 10 MG tablet TAKE 1 TABLET (10 MG TOTAL) BY MOUTH 2 (TWO) TIMES DAILY AS NEEDED. 30 tablet 3   dicyclomine (BENTYL) 10 MG capsule TAKE 1 CAPSULE BY MOUTH 2 TIMES DAILY. 180 capsule 1   fluticasone (FLONASE) 50 MCG/ACT nasal spray SPRAY 2 SPRAYS INTO EACH NOSTRIL EVERY DAY 48 mL 0   ibuprofen (ADVIL) 400 MG tablet Take 1-2 tablets (400-800 mg total) by mouth every 8 (eight) hours as needed. 30 tablet 0   ipratropium (ATROVENT) 0.06 % nasal spray Place 2 sprays into both nostrils 4 (four) times daily. (Patient taking differently: Place 2 sprays into both nostrils in the morning and at bedtime.) 15 mL 1   lansoprazole (PREVACID) 30 MG capsule TAKE 1 CAPSULE BY MOUTH TWICE A DAY 180 capsule 0   LINZESS 290 MCG CAPS capsule TAKE 1 CAPSULE (290 MCG TOTAL) BY MOUTH DAILY IN PM 90 capsule 1   polyethylene glycol powder (GLYCOLAX/MIRALAX) 17 GM/SCOOP powder Take by mouth.     pseudoephedrine (SUDAFED) 30 MG tablet Take by mouth.     verapamil (CALAN) 120 MG tablet Take 60 mg by mouth once. For migraines     No current facility-administered medications for this visit.    Allergies as of 06/11/2021 - Review Complete 06/11/2021  Allergen Reaction Noted   Oxycodone  01/31/2020    Family  History  Problem Relation Age of Onset   Other Father        chemical PNA siphoning gas   Healthy Brother    Colon cancer Neg Hx    Esophageal cancer Neg Hx    Rectal cancer Neg Hx    Stomach cancer Neg Hx     Social History   Socioeconomic History   Marital status: Married    Spouse name: Not on file   Number of children: 1   Years of education: Not on file  Highest education level: Not on file  Occupational History   Occupation: Education officer, museum product-sells    Employer: [employer]  Tobacco Use   Smoking status: Former    Packs/day: 1.00    Years: 10.00    Pack years: 10.00    Types: Cigarettes    Quit date: 07/06/1993    Years since quitting: 27.9   Smokeless tobacco: Never  Vaping Use   Vaping Use: Never used  Substance and Sexual Activity   Alcohol use: Yes    Comment: 1 or 2 daily   Drug use: No   Sexual activity: Not on file  Other Topics Concern   Not on file  Social History Narrative   Married, one daughter.   Educ: [employer]   Occup: Biochemist, clinical for [employer].    No tobacco, 2 beers a night, no hx of problem drinking/drug use.   Caffeine: 2-3 cups coffee every morning.  Tea x 2 later in day.   Social Determinants of Health   Financial Resource Strain: Not on file  Food Insecurity: Not on file  Transportation Needs: Not on file  Physical Activity: Not on file  Stress: Not on file  Social Connections: Not on file  Intimate Partner Violence: Not on file    Review of Systems:    Constitutional: No weight loss, fever or chills Cardiovascular: No chest pain  Respiratory: No SOB  Gastrointestinal: See HPI and otherwise negative   Physical Exam:  Vital signs: BP 140/70   Pulse 94   Ht 5' 8.5" (1.74 m)   Wt 149 lb 2 oz (67.6 kg)   BMI 22.34 kg/m   Constitutional:   Pleasant Caucasian male appears to be in NAD, Well developed, Well nourished, alert and cooperative Respiratory: Respirations even and unlabored. Lungs clear  to auscultation bilaterally.   No wheezes, crackles, or rhonchi.  Cardiovascular: Normal S1, S2. No MRG. Regular rate and rhythm. No peripheral edema, cyanosis or pallor.  Gastrointestinal:  Soft, nondistended, nontender. No rebound or guarding. Normal bowel sounds. No appreciable masses or hepatomegaly. Rectal:  Not performed.  Psychiatric: Demonstrates good judgement and reason without abnormal affect or behaviors.  RELEVANT LABS AND IMAGING: CBC    Component Value Date/Time   WBC 5.1 07/12/2020 0904   RBC 4.41 07/12/2020 0904   HGB 14.5 07/12/2020 0904   HCT 43.1 07/12/2020 0904   PLT 345.0 07/12/2020 0904   MCV 97.8 07/12/2020 0904   MCHC 33.6 07/12/2020 0904   RDW 13.9 07/12/2020 0904   LYMPHSABS 1.0 07/12/2020 0904   MONOABS 0.7 07/12/2020 0904   EOSABS 0.1 07/12/2020 0904   BASOSABS 0.0 07/12/2020 0904    CMP     Component Value Date/Time   NA 136 01/31/2021 0828   K 4.3 01/31/2021 0828   CL 101 01/31/2021 0828   CO2 28 01/31/2021 0828   GLUCOSE 78 01/31/2021 0828   BUN 9 01/31/2021 0828   CREATININE 0.85 01/31/2021 0828   CREATININE 1.02 05/30/2020 1543   CALCIUM 10.0 01/31/2021 0828   PROT 7.0 01/31/2021 0828   ALBUMIN 4.5 01/31/2021 0828   AST 23 01/31/2021 0828   ALT 27 01/31/2021 0828   ALKPHOS 62 01/31/2021 0828   BILITOT 0.6 01/31/2021 0828   GFRNONAA 77 05/30/2020 1543   GFRAA 89 05/30/2020 1543    Assessment: 1.  Chronic constipation: History of small bowel obstructions and lysis of adhesions, none since 2020, seems to be having liquid stools with Linzess 290 mcg  daily; likely still some stricturing of the bowel on top of chronic constipation  Plan: 1.  We will try a decreased dose of Linzess 145 mcg daily.  Provided him with 7 days of samples.  If he feels like this works better for him we can continue. 2.  Martin Majestic ahead and refilled Linzess 290 mcg daily for 30 days so that the patient is not without medicine.  He will let me know if he needs a years  worth of refills and what dose he would like. 3.  Patient to follow in clinic in a year or sooner if necessary.  Ellouise Newer, PA-C [facility] 06/11/2021, 9:30 AM  Cc: McGowen, Adrian Blackwater, MD

## 2021-06-17 ENCOUNTER — Telehealth: Payer: Self-pay | Admitting: Physician Assistant

## 2021-06-17 MED ORDER — LINACLOTIDE 145 MCG PO CAPS: 145.0000 ug | ORAL_CAPSULE | Freq: Every day | ORAL | 3 refills | Status: DC

## 2021-06-17 NOTE — Telephone Encounter (Signed)
Pt stated that Linzess 140 mcg is working. He needs a rf sent [facility] in [facility].

## 2021-06-17 NOTE — Telephone Encounter (Signed)
Script sent to pharmacy.

## 2021-06-24 DIAGNOSIS — M9901 Segmental and somatic dysfunction of cervical region: Secondary | ICD-10-CM | POA: Diagnosis not present

## 2021-06-24 DIAGNOSIS — M50323 Other cervical disc degeneration at C6-C7 level: Secondary | ICD-10-CM | POA: Diagnosis not present

## 2021-06-24 DIAGNOSIS — M50322 Other cervical disc degeneration at C5-C6 level: Secondary | ICD-10-CM | POA: Diagnosis not present

## 2021-06-24 DIAGNOSIS — M50321 Other cervical disc degeneration at C4-C5 level: Secondary | ICD-10-CM | POA: Diagnosis not present

## 2021-06-26 DIAGNOSIS — H10523 Angular blepharoconjunctivitis, bilateral: Secondary | ICD-10-CM | POA: Diagnosis not present

## 2021-06-26 DIAGNOSIS — H16223 Keratoconjunctivitis sicca, not specified as Sjogren's, bilateral: Secondary | ICD-10-CM | POA: Diagnosis not present

## 2021-07-15 ENCOUNTER — Other Ambulatory Visit: Payer: Self-pay | Admitting: Physician Assistant

## 2021-07-31 ENCOUNTER — Other Ambulatory Visit: Payer: Self-pay | Admitting: Family Medicine

## 2021-07-31 DIAGNOSIS — M50322 Other cervical disc degeneration at C5-C6 level: Secondary | ICD-10-CM | POA: Diagnosis not present

## 2021-07-31 DIAGNOSIS — M50321 Other cervical disc degeneration at C4-C5 level: Secondary | ICD-10-CM | POA: Diagnosis not present

## 2021-07-31 DIAGNOSIS — M9901 Segmental and somatic dysfunction of cervical region: Secondary | ICD-10-CM | POA: Diagnosis not present

## 2021-07-31 DIAGNOSIS — M50323 Other cervical disc degeneration at C6-C7 level: Secondary | ICD-10-CM | POA: Diagnosis not present

## 2021-08-01 ENCOUNTER — Other Ambulatory Visit: Payer: Self-pay | Admitting: Gastroenterology

## 2021-08-15 ENCOUNTER — Encounter: Payer: BC Managed Care – PPO | Admitting: Family Medicine

## 2021-08-18 ENCOUNTER — Other Ambulatory Visit: Payer: Self-pay | Admitting: Family Medicine

## 2021-09-01 ENCOUNTER — Telehealth: Payer: Self-pay | Admitting: Physician Assistant

## 2021-09-01 ENCOUNTER — Other Ambulatory Visit: Payer: Self-pay | Admitting: Family Medicine

## 2021-09-01 DIAGNOSIS — R1084 Generalized abdominal pain: Secondary | ICD-10-CM

## 2021-09-01 DIAGNOSIS — K5909 Other constipation: Secondary | ICD-10-CM

## 2021-09-01 MED ORDER — LINACLOTIDE 290 MCG PO CAPS: 290.0000 ug | ORAL_CAPSULE | Freq: Every day | ORAL | 11 refills | Status: DC

## 2021-09-01 NOTE — Telephone Encounter (Signed)
Inbound call from patient states he is having pains in his gut, does not want to eat, and severe constipation. Suggests he need stronger linzess prescription. Best contact number [phone]

## 2021-09-01 NOTE — Telephone Encounter (Signed)
Left message for pt to call back  °

## 2021-09-01 NOTE — Telephone Encounter (Signed)
Called and spoke with pt. Pt reports that he has been taking 2 133mcg Linzess daily which has provided him some relief. Pt is requesting Rx for Linzess 249mcg, per last office note will refill for pt. Pt also requesting a CT scan be ordered due to his previous surgical history. Pt would also like to rule out any possible obstructions. Pt states that his last BM was today but was very small. Please advise.

## 2021-09-01 NOTE — Telephone Encounter (Signed)
Patient returned call. We have reviewed recommendations. Pt is aware that Rx has been sent in for Linzess 280mcg. Pt will stop by the xray dept in the basement at his convenience. Pt verbalized understanding and had no other concerns.   X ray order in epic

## 2021-09-02 ENCOUNTER — Ambulatory Visit (INDEPENDENT_AMBULATORY_CARE_PROVIDER_SITE_OTHER)
Admission: RE | Admit: 2021-09-02 | Discharge: 2021-09-02 | Disposition: A | Discharge status: Home / Self Care | Payer: BC Managed Care – PPO | Source: Ambulatory Visit | Attending: Physician Assistant | Admitting: Physician Assistant

## 2021-09-02 ENCOUNTER — Other Ambulatory Visit: Payer: Self-pay

## 2021-09-02 DIAGNOSIS — R109 Unspecified abdominal pain: Secondary | ICD-10-CM | POA: Diagnosis not present

## 2021-09-02 DIAGNOSIS — R1084 Generalized abdominal pain: Secondary | ICD-10-CM

## 2021-09-02 DIAGNOSIS — K5909 Other constipation: Secondary | ICD-10-CM

## 2021-09-18 ENCOUNTER — Encounter: Payer: Self-pay | Admitting: Family Medicine

## 2021-09-18 ENCOUNTER — Other Ambulatory Visit: Payer: Self-pay

## 2021-09-18 ENCOUNTER — Ambulatory Visit (INDEPENDENT_AMBULATORY_CARE_PROVIDER_SITE_OTHER): Payer: BC Managed Care – PPO | Admitting: Family Medicine

## 2021-09-18 VITALS — BP 132/80 | HR 89 | Temp 98.0°F | Ht 70.0 in | Wt 153.2 lb

## 2021-09-18 DIAGNOSIS — Z125 Encounter for screening for malignant neoplasm of prostate: Secondary | ICD-10-CM | POA: Diagnosis not present

## 2021-09-18 DIAGNOSIS — R030 Elevated blood-pressure reading, without diagnosis of hypertension: Secondary | ICD-10-CM

## 2021-09-18 DIAGNOSIS — E7800 Pure hypercholesterolemia, unspecified: Secondary | ICD-10-CM

## 2021-09-18 DIAGNOSIS — Z0000 Encounter for general adult medical examination without abnormal findings: Secondary | ICD-10-CM

## 2021-09-18 LAB — CBC WITH DIFFERENTIAL/PLATELET
Basophils Absolute: 0 10*3/uL (ref 0.0–0.1)
Basophils Relative: 0.6 % (ref 0.0–3.0)
Eosinophils Absolute: 0.1 10*3/uL (ref 0.0–0.7)
Eosinophils Relative: 1.8 % (ref 0.0–5.0)
HCT: 43.2 % (ref 39.0–52.0)
Hemoglobin: 14.6 g/dL (ref 13.0–17.0)
Lymphocytes Relative: 23.6 % (ref 12.0–46.0)
Lymphs Abs: 1.1 10*3/uL (ref 0.7–4.0)
MCHC: 33.7 g/dL (ref 30.0–36.0)
MCV: 97.2 fl (ref 78.0–100.0)
Monocytes Absolute: 0.6 10*3/uL (ref 0.1–1.0)
Monocytes Relative: 13.2 % — ABNORMAL HIGH (ref 3.0–12.0)
Neutro Abs: 2.8 10*3/uL (ref 1.4–7.7)
Neutrophils Relative %: 60.8 % (ref 43.0–77.0)
Platelets: 449 10*3/uL — ABNORMAL HIGH (ref 150.0–400.0)
RBC: 4.44 Mil/uL (ref 4.22–5.81)
RDW: 13.4 % (ref 11.5–15.5)
WBC: 4.6 10*3/uL (ref 4.0–10.5)

## 2021-09-18 LAB — COMPREHENSIVE METABOLIC PANEL
ALT: 24 U/L (ref 0–53)
AST: 22 U/L (ref 0–37)
Albumin: 4.4 g/dL (ref 3.5–5.2)
Alkaline Phosphatase: 92 U/L (ref 39–117)
BUN: 6 mg/dL (ref 6–23)
CO2: 30 mEq/L (ref 19–32)
Calcium: 9.5 mg/dL (ref 8.4–10.5)
Chloride: 100 mEq/L (ref 96–112)
Creatinine, Ser: 0.81 mg/dL (ref 0.40–1.50)
GFR: 91.54 mL/min (ref >60.00)
Glucose, Bld: 79 mg/dL (ref 70–99)
Potassium: 4.8 mEq/L (ref 3.5–5.1)
Sodium: 136 mEq/L (ref 135–145)
Total Bilirubin: 0.8 mg/dL (ref 0.2–1.2)
Total Protein: 7.2 g/dL (ref 6.0–8.3)

## 2021-09-18 LAB — LIPID PANEL
Cholesterol: 175 mg/dL (ref 0–200)
HDL: 81.2 mg/dL (ref >39.00)
LDL Cholesterol: 85 mg/dL (ref 0–99)
NonHDL: 94
Total CHOL/HDL Ratio: 2
Triglycerides: 44 mg/dL (ref 0.0–149.0)
VLDL: 8.8 mg/dL (ref 0.0–40.0)

## 2021-09-18 LAB — PSA, MEDICARE: PSA: 0.2 ng/ml (ref 0.10–4.00)

## 2021-09-18 MED ORDER — LANSOPRAZOLE 30 MG PO CPDR: 30.0000 mg | DELAYED_RELEASE_CAPSULE | Freq: Two times a day (BID) | ORAL | 3 refills | Status: DC

## 2021-09-18 NOTE — Progress Notes (Signed)
Office Note 09/18/2021  CC:  Chief Complaint  Patient presents with   Annual Exam    Pt is fasting   HPI:  Patient is a 65 y.o. male who is here for annual health maintenance exam and 7 mo f/u HLD and elev bp w/out dx HTN. A/P as of last visit: "1) HLD: tolerating atorva 40mg  qd. FLP and hepatic panel today.   2) Elev bp w/out dx htn: suspect he does have mild HTN but he wants to avoid med if at all possible, prefers to start checking bp at least weekly at home and knows to call or return if bp consistently > 140/90, o/w we'll review the bp's at next f/u in 6 mo. DASH diet reviewed/handout given today. Lytes/cr today.   3) Osteoporosis with hx of vertebral fx: tolerating alendronate.  Sounds like he takes his Ca and vit D supp infrequently b/c afraid it may cause him to have more migraines. Checking vit D and Ca++ today."  INTERIM HX: He is doing well, no acute complaints. Taking cholesterol medicine daily.  Home blood pressures consistently around 130-135/75-85.     Past Medical History:  Diagnosis Date   Anxiety    prn xanax started 01/2019->self d/c'd in fall/winter 2020   Barrett's esophagus    Bx+ 2007, 2009, and 2020.   Chronic constipation    GI->Linzess trial 05/2019->helpful   Contact dermatitis and other eczema, due to unspecified cause    +scarring   Diverticulosis 2021   noted on CT abd 06/2020   Epididymal cyst 06/2018   Bilat: small on R, but 4 cm on left  Asymptomatic.   GERD (gastroesophageal reflux disease)    History of adenomatous polyp of colon 08/10/2006; 01/2020   2007 and 12/2018; 01/2020, recall 2024   Hyperlipidemia    Incisional hernia    Left foot pain    ? Arch strain.  Some numbness to suggest possible tarsal tunnel syndrome vs lumbar radiculitis (sports med MD).  Responded very well to custom orthotic.   Migraine syndrome    verapamil prophylaxis, cluster migraine   Pseudophakia of both eyes    Seasonal and perennial allergic rhinitis     vaccine/injection therapy helpful   Small bowel obstruction ([place]) Fall of 2019   surgery->lysis of adhesions   Vertebral compression fracture ([place]) 05/2019   T11 and T12 and L 1, minimal trauma.  Dr. Darene Lamer put him on fosamax.    Past Surgical History:  Procedure Laterality Date   ABDOMINAL EXPLORATION SURGERY  Fall 2019   SBO->lysis of adhesions   COLONOSCOPY W/ POLYPECTOMY  08/10/06; 12/2018   Several adenomatous polyps 2007 and 2020; 01-25-2020. recall 2024   DEXA  05/24/2019   Dr. Bartholome Bill -2.4 + vert comp fx->fosamax started.   ESOPHAGOGASTRODUODENOSCOPY  2007 and 07/20/08;12/2018   GERD, BARRET's esoph (Dr. Henrene Pastor). Same 12/2018.   INCISIONAL HERNIA REPAIR N/A 02/08/2020   Procedure: LAPAROSCOPIC INCISIONAL HERNIA REPAIR WITH MESH;  Surgeon: Kinsinger, Arta Bruce, MD;  Location: [facility];  Service: General;  Laterality: N/A;   INTRAOCULAR LENS INSERTION  09/03/2020   OU   LE venous doppler u/s  04/19/15   L leg NORMAL   right knee arthroscopy  1980s   Torn meniscus (Dr. Noemi Chapel)    Family History  Problem Relation Age of Onset   Other Father        chemical PNA siphoning gas   Healthy Brother    Colon cancer Neg Hx  Esophageal cancer Neg Hx    Rectal cancer Neg Hx    Stomach cancer Neg Hx     Social History   Socioeconomic History   Marital status: Married    Spouse name: Not on file   Number of children: 1   Years of education: Not on file   Highest education level: Not on file  Occupational History   Occupation: Education officer, museum product-sells    Employer: [employer]  Tobacco Use   Smoking status: Former    Packs/day: 1.00    Years: 10.00    Pack years: 10.00    Types: Cigarettes    Quit date: 07/06/1993    Years since quitting: 28.2   Smokeless tobacco: Never  Vaping Use   Vaping Use: Never used  Substance and Sexual Activity   Alcohol use: Yes    Comment: 1 or 2 daily   Drug use: No   Sexual activity: Not on file   Other Topics Concern   Not on file  Social History Narrative   Married, one daughter.   Educ: [employer]   Occup: Biochemist, clinical for [employer].    No tobacco, 2 beers a night, no hx of problem drinking/drug use.   Caffeine: 2-3 cups coffee every morning.  Tea x 2 later in day.   Social Determinants of Health   Financial Resource Strain: Not on file  Food Insecurity: Not on file  Transportation Needs: Not on file  Physical Activity: Not on file  Stress: Not on file  Social Connections: Not on file  Intimate Partner Violence: Not on file    Outpatient Medications Prior to Visit  Medication Sig Dispense Refill   alendronate (FOSAMAX) 70 MG tablet Take 1 tablet (70 mg total) by mouth every 7 (seven) days. 12 tablet 3   atorvastatin (LIPITOR) 40 MG tablet TAKE 1 TABLET BY MOUTH EVERY DAY 90 tablet 3   Calcium Carbonate-Vitamin D3 (CALCIUM + VITAMIN D3) 600-400 MG-UNIT TABS Take 1 tablet by mouth 2 (two) times daily. 180 tablet 3   cyclobenzaprine (FLEXERIL) 10 MG tablet TAKE 1 TABLET BY MOUTH 2 TIMES DAILY AS NEEDED. 30 tablet 1   dicyclomine (BENTYL) 10 MG capsule TAKE 1 CAPSULE BY MOUTH TWICE A DAY 180 capsule 1   fluticasone (FLONASE) 50 MCG/ACT nasal spray SPRAY 2 SPRAYS INTO EACH NOSTRIL EVERY DAY 9.9 mL 0   ibuprofen (ADVIL) 400 MG tablet Take 1-2 tablets (400-800 mg total) by mouth every 8 (eight) hours as needed. 30 tablet 0   lansoprazole (PREVACID) 30 MG capsule TAKE 1 CAPSULE BY MOUTH TWICE A DAY 180 capsule 0   linaclotide (LINZESS) 290 MCG CAPS capsule Take 1 capsule (290 mcg total) by mouth daily before breakfast. 30 capsule 11   polyethylene glycol powder (GLYCOLAX/MIRALAX) 17 GM/SCOOP powder Take by mouth.     pseudoephedrine (SUDAFED) 30 MG tablet Take by mouth.     verapamil (CALAN) 120 MG tablet Take 60 mg by mouth once. For migraines     ipratropium (ATROVENT) 0.06 % nasal spray Place 2 sprays into both nostrils 4 (four) times daily. (Patient not taking: Reported on  09/18/2021) 15 mL 1   No facility-administered medications prior to visit.    Allergies  Allergen Reactions   Oxycodone     nausea   ROS Review of Systems  Constitutional:  Negative for appetite change, chills, fatigue and fever.  HENT:  Negative for congestion, dental problem, ear pain and sore throat.  Eyes:  Negative for discharge, redness and visual disturbance.  Respiratory:  Negative for cough, chest tightness, shortness of breath and wheezing.   Cardiovascular:  Negative for chest pain, palpitations and leg swelling.  Gastrointestinal:  Positive for constipation (chronic). Negative for abdominal pain, blood in stool, diarrhea, nausea and vomiting.  Genitourinary:  Negative for difficulty urinating, dysuria, flank pain, frequency, hematuria and urgency.  Musculoskeletal:  Positive for back pain (mild, intermittent---since compression fractures a couple years ago). Negative for arthralgias, joint swelling, myalgias and neck stiffness.  Skin:  Negative for pallor and rash.  Neurological:  Negative for dizziness, speech difficulty, weakness and headaches.  Hematological:  Negative for adenopathy. Does not bruise/bleed easily.  Psychiatric/Behavioral:  Negative for confusion and sleep disturbance. The patient is not nervous/anxious.    PE; Vitals with BMI 09/18/2021 06/11/2021 01/31/2021  Height 5\' 10"  5' 8.5" -  Weight 153 lbs 3 oz 149 lbs 2 oz 152 lbs 3 oz  BMI 08.67 61.95 -  Systolic 093 267 124  Diastolic 90 70 93  Pulse 89 94 92   Gen: Alert, well appearing.  Patient is oriented to person, place, time, and situation. AFFECT: pleasant, lucid thought and speech. ENT: Ears: EACs clear, normal epithelium.  TMs with good light reflex and landmarks bilaterally.  Eyes: no injection, icteris, swelling, or exudate.  EOMI, PERRLA. Nose: no drainage or turbinate edema/swelling.  No injection or focal lesion.  Mouth: lips without lesion/swelling.  Oral mucosa pink and moist.  Dentition  intact and without obvious caries or gingival swelling.  Oropharynx without erythema, exudate, or swelling.  Neck: supple/nontender.  No LAD, mass, or TM.  Carotid pulses 2+ bilaterally, without bruits. CV: RRR, no m/r/g.   LUNGS: CTA bilat, nonlabored resps, good aeration in all lung fields. ABD: soft, NT, ND, BS normal.  No hepatospenomegaly or mass.  No bruits. EXT: no clubbing, cyanosis, or edema.  Musculoskeletal: no joint swelling, erythema, warmth, or tenderness.  ROM of all joints intact. Skin - no sores or suspicious lesions or rashes or color changes  Pertinent labs:  Lab Results  Component Value Date   TSH 1.94 06/23/2018   Lab Results  Component Value Date   WBC 5.1 07/12/2020   HGB 14.5 07/12/2020   HCT 43.1 07/12/2020   MCV 97.8 07/12/2020   PLT 345.0 07/12/2020   Lab Results  Component Value Date   CREATININE 0.85 01/31/2021   BUN 9 01/31/2021   NA 136 01/31/2021   K 4.3 01/31/2021   CL 101 01/31/2021   CO2 28 01/31/2021   Lab Results  Component Value Date   ALT 27 01/31/2021   AST 23 01/31/2021   ALKPHOS 62 01/31/2021   BILITOT 0.6 01/31/2021   Lab Results  Component Value Date   CHOL 160 01/31/2021   Lab Results  Component Value Date   HDL 72.70 01/31/2021   Lab Results  Component Value Date   LDLCALC 76 01/31/2021   Lab Results  Component Value Date   TRIG 56.0 01/31/2021   Lab Results  Component Value Date   CHOLHDL 2 01/31/2021   Lab Results  Component Value Date   PSA 0.16 07/12/2020   PSA 0.22 06/29/2019   PSA 0.17 06/23/2018   ASSESSMENT AND PLAN:   #1 hyperlipidemia.  Doing well on atorvastatin 40 mg a day. Last LDL 8 months ago was 76. Lipid and hepatic panel today.  2.  Borderline hypertension.  Patient reiterates that he wants to stay off medications  if at all possible.  I think it is okay to continue to monitor at home and do no meds at this point.  He is working on low-sodium diet.  3. Health maintenance  exam: Reviewed age and gender appropriate health maintenance issues (prudent diet, regular exercise, health risks of tobacco and excessive alcohol, use of seatbelts, fire alarms in home, use of sunscreen).  Also reviewed age and gender appropriate health screening as well as vaccine recommendations. Vaccines: Prevnar 20->UTD.  Otherwise all utd. Labs: cbc, cmet, flp, psa. Prostate ca screening: PSA today. Colon ca screening: recall 2024.  #4 history of compression fractures--T11, T12, and L1.  He was started on Fosamax by Dr. Darene Lamer back in August 2020.  Patient reports a follow-up DEXA this year with Dr. Darene Lamer was improved.  Continue supplemental calcium, vitamin D, and continue Fosamax under the monitoring of Dr. Darene Lamer.  An After Visit Summary was printed and given to the patient.  FOLLOW UP:  No follow-ups on file.  Signed:  Crissie Sickles, MD           09/18/2021

## 2021-09-18 NOTE — Patient Instructions (Signed)

## 2021-09-19 ENCOUNTER — Telehealth: Payer: Self-pay

## 2021-09-19 NOTE — Telephone Encounter (Signed)
Spoke with pt regarding results/recommendations,voiced understanding. ? ?

## 2021-09-19 NOTE — Telephone Encounter (Signed)
Patient returning call about lab results.  

## 2021-10-28 ENCOUNTER — Other Ambulatory Visit: Payer: Self-pay | Admitting: Family Medicine

## 2021-10-28 DIAGNOSIS — M50321 Other cervical disc degeneration at C4-C5 level: Secondary | ICD-10-CM | POA: Diagnosis not present

## 2021-10-28 DIAGNOSIS — M50322 Other cervical disc degeneration at C5-C6 level: Secondary | ICD-10-CM | POA: Diagnosis not present

## 2021-10-28 DIAGNOSIS — M50323 Other cervical disc degeneration at C6-C7 level: Secondary | ICD-10-CM | POA: Diagnosis not present

## 2021-10-28 DIAGNOSIS — M9901 Segmental and somatic dysfunction of cervical region: Secondary | ICD-10-CM | POA: Diagnosis not present

## 2021-11-27 DIAGNOSIS — M50321 Other cervical disc degeneration at C4-C5 level: Secondary | ICD-10-CM | POA: Diagnosis not present

## 2021-11-27 DIAGNOSIS — M50323 Other cervical disc degeneration at C6-C7 level: Secondary | ICD-10-CM | POA: Diagnosis not present

## 2021-11-27 DIAGNOSIS — M9901 Segmental and somatic dysfunction of cervical region: Secondary | ICD-10-CM | POA: Diagnosis not present

## 2021-11-27 DIAGNOSIS — M50322 Other cervical disc degeneration at C5-C6 level: Secondary | ICD-10-CM | POA: Diagnosis not present

## 2021-12-08 DIAGNOSIS — G44009 Cluster headache syndrome, unspecified, not intractable: Secondary | ICD-10-CM | POA: Diagnosis not present

## 2021-12-14 ENCOUNTER — Other Ambulatory Visit: Payer: Self-pay | Admitting: Family Medicine

## 2021-12-22 ENCOUNTER — Other Ambulatory Visit: Payer: Self-pay | Admitting: Family Medicine

## 2022-01-01 DIAGNOSIS — M50322 Other cervical disc degeneration at C5-C6 level: Secondary | ICD-10-CM | POA: Diagnosis not present

## 2022-01-01 DIAGNOSIS — M50323 Other cervical disc degeneration at C6-C7 level: Secondary | ICD-10-CM | POA: Diagnosis not present

## 2022-01-01 DIAGNOSIS — M50321 Other cervical disc degeneration at C4-C5 level: Secondary | ICD-10-CM | POA: Diagnosis not present

## 2022-01-01 DIAGNOSIS — M9901 Segmental and somatic dysfunction of cervical region: Secondary | ICD-10-CM | POA: Diagnosis not present

## 2022-01-08 ENCOUNTER — Other Ambulatory Visit: Payer: Self-pay | Admitting: Family Medicine

## 2022-01-15 ENCOUNTER — Other Ambulatory Visit: Payer: Self-pay | Admitting: Family Medicine

## 2022-01-23 ENCOUNTER — Other Ambulatory Visit: Payer: Self-pay | Admitting: Gastroenterology

## 2022-01-29 DIAGNOSIS — M50321 Other cervical disc degeneration at C4-C5 level: Secondary | ICD-10-CM | POA: Diagnosis not present

## 2022-01-29 DIAGNOSIS — M9901 Segmental and somatic dysfunction of cervical region: Secondary | ICD-10-CM | POA: Diagnosis not present

## 2022-01-29 DIAGNOSIS — M50323 Other cervical disc degeneration at C6-C7 level: Secondary | ICD-10-CM | POA: Diagnosis not present

## 2022-01-29 DIAGNOSIS — M50322 Other cervical disc degeneration at C5-C6 level: Secondary | ICD-10-CM | POA: Diagnosis not present

## 2022-02-19 ENCOUNTER — Other Ambulatory Visit: Payer: Self-pay | Admitting: Sports Medicine

## 2022-02-19 DIAGNOSIS — M8000XD Age-related osteoporosis with current pathological fracture, unspecified site, subsequent encounter for fracture with routine healing: Secondary | ICD-10-CM

## 2022-03-05 DIAGNOSIS — M9901 Segmental and somatic dysfunction of cervical region: Secondary | ICD-10-CM | POA: Diagnosis not present

## 2022-03-05 DIAGNOSIS — M50322 Other cervical disc degeneration at C5-C6 level: Secondary | ICD-10-CM | POA: Diagnosis not present

## 2022-03-05 DIAGNOSIS — M50321 Other cervical disc degeneration at C4-C5 level: Secondary | ICD-10-CM | POA: Diagnosis not present

## 2022-03-05 DIAGNOSIS — M50323 Other cervical disc degeneration at C6-C7 level: Secondary | ICD-10-CM | POA: Diagnosis not present

## 2022-03-19 ENCOUNTER — Ambulatory Visit: Payer: BC Managed Care – PPO | Admitting: Family Medicine

## 2022-03-25 ENCOUNTER — Other Ambulatory Visit: Payer: Self-pay | Admitting: Family Medicine

## 2022-04-02 ENCOUNTER — Encounter: Payer: Self-pay | Admitting: Family Medicine

## 2022-04-02 ENCOUNTER — Ambulatory Visit: Payer: BC Managed Care – PPO | Admitting: Family Medicine

## 2022-04-02 VITALS — BP 133/85 | HR 82 | Temp 97.9°F | Ht 70.0 in | Wt 153.6 lb

## 2022-04-02 DIAGNOSIS — R030 Elevated blood-pressure reading, without diagnosis of hypertension: Secondary | ICD-10-CM | POA: Diagnosis not present

## 2022-04-02 DIAGNOSIS — E7800 Pure hypercholesterolemia, unspecified: Secondary | ICD-10-CM | POA: Diagnosis not present

## 2022-04-02 LAB — LIPID PANEL
Cholesterol: 159 mg/dL (ref 0–200)
HDL: 74.6 mg/dL (ref >39.00)
LDL Cholesterol: 75 mg/dL (ref 0–99)
NonHDL: 84.7
Total CHOL/HDL Ratio: 2
Triglycerides: 50 mg/dL (ref 0.0–149.0)
VLDL: 10 mg/dL (ref 0.0–40.0)

## 2022-04-02 LAB — COMPREHENSIVE METABOLIC PANEL
ALT: 27 U/L (ref 0–53)
AST: 24 U/L (ref 0–37)
Albumin: 4.4 g/dL (ref 3.5–5.2)
Alkaline Phosphatase: 73 U/L (ref 39–117)
BUN: 8 mg/dL (ref 6–23)
CO2: 29 mEq/L (ref 19–32)
Calcium: 9.4 mg/dL (ref 8.4–10.5)
Chloride: 99 mEq/L (ref 96–112)
Creatinine, Ser: 0.82 mg/dL (ref 0.40–1.50)
GFR: 90.86 mL/min (ref >60.00)
Glucose, Bld: 86 mg/dL (ref 70–99)
Potassium: 4.5 mEq/L (ref 3.5–5.1)
Sodium: 134 mEq/L — ABNORMAL LOW (ref 135–145)
Total Bilirubin: 0.7 mg/dL (ref 0.2–1.2)
Total Protein: 6.7 g/dL (ref 6.0–8.3)

## 2022-04-02 MED ORDER — FLUTICASONE PROPIONATE 50 MCG/ACT NA SUSP
NASAL | 1 refills | Status: DC
Start: 2022-04-02 — End: 2022-10-06

## 2022-04-02 NOTE — Progress Notes (Signed)
OFFICE VISIT  04/02/2022  CC:  Chief Complaint  Patient presents with   Hypertension    Pt is fasting. Pt brought bp machine for comparison; 143/96 P 84    Patient is a 66 y.o. male who presents for 6 mo f/u HLD and borderline HTN. A/P as of last visit: "#1 hyperlipidemia.  Doing well on atorvastatin 40 mg a day. Last LDL 8 months ago was 76. Lipid and hepatic panel today.  2.  Borderline hypertension.  Patient reiterates that he wants to stay off medications if at all possible.  I think it is okay to continue to monitor at home and do no meds at this point.  He is working on low-sodium diet.  3. Health maintenance exam: Reviewed age and gender appropriate health maintenance issues (prudent diet, regular exercise, health risks of tobacco and excessive alcohol, use of seatbelts, fire alarms in home, use of sunscreen).  Also reviewed age and gender appropriate health screening as well as vaccine recommendations. Vaccines: Prevnar 20->UTD.  Otherwise all utd. Labs: cbc, cmet, flp, psa. Prostate ca screening: PSA today. Colon ca screening: recall 2024.   #4 history of compression fractures--T11, T12, and L1.  He was started on Fosamax by Dr. Darene Lamer back in August 2020.  Patient reports a follow-up DEXA this year with Dr. Darene Lamer was improved.  Continue supplemental calcium, vitamin D, and continue Fosamax under the monitoring of Dr. Darene Lamer."  INTERIM HX: He brings in blood pressure measurements today and all of them are in the 024-097 systolic range and 35H to 29J diastolic range.  He says it is occasionally goes up in the 242A to 834 systolic range after he has been stressed or active.  He goes down very shortly after that.   Past Medical History:  Diagnosis Date   Anxiety    prn xanax started 01/2019->self d/c'd in fall/winter 2020   Barrett's esophagus    Bx+ 2007, 2009, and 2020.   Chronic constipation    GI->Linzess trial 05/2019->helpful   Contact dermatitis and other eczema, due to  unspecified cause    +scarring   Diverticulosis 2021   noted on CT abd 06/2020   Epididymal cyst 06/2018   Bilat: small on R, but 4 cm on left  Asymptomatic.   GERD (gastroesophageal reflux disease)    History of adenomatous polyp of colon 08/10/2006; 01/2020   2007 and 12/2018; 01/2020, recall 2024   Hyperlipidemia    Incisional hernia    Left foot pain    ? Arch strain.  Some numbness to suggest possible tarsal tunnel syndrome vs lumbar radiculitis (sports med MD).  Responded very well to custom orthotic.   Migraine syndrome    verapamil prophylaxis, cluster migraine   Pseudophakia of both eyes    Seasonal and perennial allergic rhinitis    vaccine/injection therapy helpful   Small bowel obstruction ([place]) Fall of 2019   surgery->lysis of adhesions   Vertebral compression fracture ([place]) 05/2019   T11 and T12 and L 1, minimal trauma.  Dr. Darene Lamer put him on fosamax.    Past Surgical History:  Procedure Laterality Date   ABDOMINAL EXPLORATION SURGERY  Fall 2019   SBO->lysis of adhesions   COLONOSCOPY W/ POLYPECTOMY  08/10/06; 12/2018   Several adenomatous polyps 2007 and 2020; 01-25-2020. recall 2024   DEXA  05/24/2019   Dr. Bartholome Bill -2.4 + vert comp fx->fosamax started.   ESOPHAGOGASTRODUODENOSCOPY  2007 and 07/20/08;12/2018   GERD, BARRET's esoph (Dr. Henrene Pastor). Same 12/2018.  INCISIONAL HERNIA REPAIR N/A 02/08/2020   Procedure: LAPAROSCOPIC INCISIONAL HERNIA REPAIR WITH MESH;  Surgeon: Kinsinger, Arta Bruce, MD;  Location: [facility];  Service: General;  Laterality: N/A;   INTRAOCULAR LENS INSERTION  09/03/2020   OU   LE venous doppler u/s  04/19/15   L leg NORMAL   right knee arthroscopy  1980s   Torn meniscus (Dr. Noemi Chapel)    Outpatient Medications Prior to Visit  Medication Sig Dispense Refill   alendronate (FOSAMAX) 70 MG tablet TAKE 1 TABLET BY MOUTH EVERY 7 DAYS. 12 tablet 0   atorvastatin (LIPITOR) 40 MG tablet TAKE 1 TABLET BY MOUTH EVERY DAY 90 tablet 3    Calcium Carbonate-Vitamin D3 (CALCIUM + VITAMIN D3) 600-400 MG-UNIT TABS Take 1 tablet by mouth 2 (two) times daily. 180 tablet 3   dicyclomine (BENTYL) 10 MG capsule TAKE 1 CAPSULE BY MOUTH TWICE A DAY 180 capsule 1   fluticasone (FLONASE) 50 MCG/ACT nasal spray SPRAY 2 SPRAYS INTO EACH NOSTRIL EVERY DAY 16 mL 1   lansoprazole (PREVACID) 30 MG capsule Take 1 capsule (30 mg total) by mouth 2 (two) times daily. 180 capsule 3   linaclotide (LINZESS) 290 MCG CAPS capsule Take 1 capsule (290 mcg total) by mouth daily before breakfast. 30 capsule 11   polyethylene glycol powder (GLYCOLAX/MIRALAX) 17 GM/SCOOP powder Take by mouth.     pseudoephedrine (SUDAFED) 30 MG tablet Take by mouth.     verapamil (CALAN) 120 MG tablet Take 60 mg by mouth once. For migraines     cyclobenzaprine (FLEXERIL) 10 MG tablet TAKE 1 TABLET BY MOUTH TWICE A DAY AS NEEDED (Patient not taking: Reported on 04/02/2022) 30 tablet 1   ibuprofen (ADVIL) 400 MG tablet Take 1-2 tablets (400-800 mg total) by mouth every 8 (eight) hours as needed. (Patient not taking: Reported on 04/02/2022) 30 tablet 0   No facility-administered medications prior to visit.    Allergies  Allergen Reactions   Oxycodone     nausea    ROS As per HPI  PE:    04/02/2022    8:08 AM 09/18/2021    9:43 AM 09/18/2021    9:12 AM  Vitals with BMI  Height '5\' 10"'$   '5\' 10"'$   Weight 153 lbs 10 oz  153 lbs 3 oz  BMI 16.10  96.04  Systolic 540 981 191  Diastolic 85 80 90  Pulse 82  89     Physical Exam  Gen: Alert, well appearing.  Patient is oriented to person, place, time, and situation. AFFECT: pleasant, lucid thought and speech. No further exam today.  LABS:  Last CBC Lab Results  Component Value Date   WBC 4.6 09/18/2021   HGB 14.6 09/18/2021   HCT 43.2 09/18/2021   MCV 97.2 09/18/2021   RDW 13.4 09/18/2021   PLT 449.0 (H) 47/82/9562   Last metabolic panel Lab Results  Component Value Date   GLUCOSE 79 09/18/2021   NA 136  09/18/2021   K 4.8 09/18/2021   CL 100 09/18/2021   CO2 30 09/18/2021   BUN 6 09/18/2021   CREATININE 0.81 09/18/2021   GFRNONAA 77 05/30/2020   CALCIUM 9.5 09/18/2021   PROT 7.2 09/18/2021   ALBUMIN 4.4 09/18/2021   BILITOT 0.8 09/18/2021   ALKPHOS 92 09/18/2021   AST 22 09/18/2021   ALT 24 09/18/2021   Last lipids Lab Results  Component Value Date   CHOL 175 09/18/2021   HDL 81.20 09/18/2021   LDLCALC 85  09/18/2021   TRIG 44.0 09/18/2021   CHOLHDL 2 09/18/2021   Last thyroid functions Lab Results  Component Value Date   TSH 1.94 06/23/2018   IMPRESSION AND PLAN:  #1 hypercholesterolemia, doing well on atorvastatin 40 mg a day. Lipid panel and hepatic panel today.  2.  Elevated blood pressure without diagnosis of hypertension. Home blood pressures completely normal.  Reassured.  Continue to monitor blood pressure about once a week.  An After Visit Summary was printed and given to the patient.  FOLLOW UP: Return in about 6 months (around 10/02/2022) for annual CPE (fasting.  Signed:  Crissie Sickles, MD           04/02/2022

## 2022-04-09 DIAGNOSIS — M9901 Segmental and somatic dysfunction of cervical region: Secondary | ICD-10-CM | POA: Diagnosis not present

## 2022-04-09 DIAGNOSIS — M50323 Other cervical disc degeneration at C6-C7 level: Secondary | ICD-10-CM | POA: Diagnosis not present

## 2022-04-09 DIAGNOSIS — M50321 Other cervical disc degeneration at C4-C5 level: Secondary | ICD-10-CM | POA: Diagnosis not present

## 2022-04-09 DIAGNOSIS — M50322 Other cervical disc degeneration at C5-C6 level: Secondary | ICD-10-CM | POA: Diagnosis not present

## 2022-05-04 ENCOUNTER — Other Ambulatory Visit: Payer: Self-pay | Admitting: Gastroenterology

## 2022-05-10 ENCOUNTER — Other Ambulatory Visit: Payer: Self-pay | Admitting: Sports Medicine

## 2022-05-10 DIAGNOSIS — M8000XD Age-related osteoporosis with current pathological fracture, unspecified site, subsequent encounter for fracture with routine healing: Secondary | ICD-10-CM

## 2022-05-11 ENCOUNTER — Other Ambulatory Visit: Payer: Self-pay | Admitting: Family Medicine

## 2022-05-14 ENCOUNTER — Other Ambulatory Visit: Payer: Self-pay | Admitting: *Deleted

## 2022-05-14 DIAGNOSIS — M9901 Segmental and somatic dysfunction of cervical region: Secondary | ICD-10-CM | POA: Diagnosis not present

## 2022-05-14 DIAGNOSIS — M50323 Other cervical disc degeneration at C6-C7 level: Secondary | ICD-10-CM | POA: Diagnosis not present

## 2022-05-14 DIAGNOSIS — M50321 Other cervical disc degeneration at C4-C5 level: Secondary | ICD-10-CM | POA: Diagnosis not present

## 2022-05-14 DIAGNOSIS — M50322 Other cervical disc degeneration at C5-C6 level: Secondary | ICD-10-CM | POA: Diagnosis not present

## 2022-05-14 NOTE — Patient Outreach (Signed)
  Care Coordination   05/14/2022 Name: Jacob Blanchard MRN: [id number] DOB: [birth year]   Care Coordination Outreach Attempts:  An unsuccessful telephone outreach was attempted today to offer the patient information about available care coordination services as a benefit of their health plan.   Follow Up Plan:  Additional outreach attempts will be made to offer the patient care coordination information and services.   Encounter Outcome:  No Answer  Care Coordination Interventions Activated:  No   Care Coordination Interventions:  No, not indicated    Raina Mina, RN Care Management Coordinator [place] Office [phone]

## 2022-05-25 ENCOUNTER — Other Ambulatory Visit: Payer: Self-pay | Admitting: *Deleted

## 2022-05-25 ENCOUNTER — Encounter: Payer: Self-pay | Admitting: *Deleted

## 2022-05-25 NOTE — Patient Instructions (Signed)
Visit Information  Thank you for taking time to visit with me today. Please don't hesitate to contact me if I can be of assistance to you.   Following are the goals we discussed today:   Goals Addressed               This Visit's Progress     no needs (pt-stated)        Care Coordination Interventions: Provided education to patient and/or caregiver about advanced directives Reviewed medications with patient and discussed purpose of all medications Reviewed scheduled/upcoming provider appointments including all pending appointments.RN inquired on scheduling an AWV with provider and requested pt to call for this appointment (pt receptive). Screening for signs and symptoms of depression related to chronic disease state  Assessed social determinant of health barriers        Please call the care guide team at [phone] if you need to cancel or reschedule your appointment.   If you are experiencing a Mental Health or [place] or need someone to talk to, please call the Suicide and Crisis Lifeline: 988  Patient verbalizes understanding of instructions and care plan provided today and agrees to view in [place]. Active MyChart status and patient understanding of how to access instructions and care plan via MyChart confirmed with patient.     No further follow up required: No presented needs at this time.  Raina Mina, RN Care Management Coordinator [place] Office [phone]

## 2022-05-25 NOTE — Patient Outreach (Signed)
  Care Coordination   Initial Visit Note   05/25/2022 Name: Jacob Blanchard MRN: [id number] DOB: 1956/01/08  Jacob Blanchard is a 66 y.o. year old male who sees McGowen, Adrian [place], MD for primary care. I spoke with  Jacob Blanchard by phone today  What matters to the patients health and wellness today?  No needs    Goals Addressed               This Visit's Progress     no needs (pt-stated)        Care Coordination Interventions: Provided education to patient and/or caregiver about advanced directives Reviewed medications with patient and discussed purpose of all medications Reviewed scheduled/upcoming provider appointments including all pending appointments.RN inquired on scheduling an AWV with provider and requested pt to call for this appointment (pt receptive). Screening for signs and symptoms of depression related to chronic disease state  Assessed social determinant of health barriers         SDOH assessments and interventions completed:  Yes  SDOH Interventions Today    Flowsheet Row Most Recent Value  SDOH Interventions   Food Insecurity Interventions Intervention Not Indicated  Housing Interventions Intervention Not Indicated  Transportation Interventions Intervention Not Indicated        Care Coordination Interventions Activated:  Yes  Care Coordination Interventions:  Yes, provided   Follow up plan: No further intervention required.   Encounter Outcome:  Pt. Visit Completed   Raina Mina, RN Care Management Coordinator [place] Office [phone]

## 2022-05-29 ENCOUNTER — Ambulatory Visit: Payer: BC Managed Care – PPO | Admitting: Sports Medicine

## 2022-05-29 ENCOUNTER — Ambulatory Visit (INDEPENDENT_AMBULATORY_CARE_PROVIDER_SITE_OTHER): Payer: BC Managed Care – PPO

## 2022-05-29 ENCOUNTER — Encounter: Payer: Self-pay | Admitting: Sports Medicine

## 2022-05-29 DIAGNOSIS — M79672 Pain in left foot: Secondary | ICD-10-CM | POA: Diagnosis not present

## 2022-05-29 DIAGNOSIS — M8000XD Age-related osteoporosis with current pathological fracture, unspecified site, subsequent encounter for fracture with routine healing: Secondary | ICD-10-CM | POA: Diagnosis not present

## 2022-05-29 DIAGNOSIS — M25572 Pain in left ankle and joints of left foot: Secondary | ICD-10-CM

## 2022-05-29 MED ORDER — ALENDRONATE SODIUM 70 MG PO TABS: 70.0000 mg | ORAL_TABLET | ORAL | 3 refills | Status: DC

## 2022-05-29 MED ORDER — MELOXICAM 15 MG PO TABS: ORAL_TABLET | ORAL | 3 refills | Status: DC

## 2022-05-29 NOTE — Assessment & Plan Note (Signed)
Jacob Blanchard returns, he is a pleasant 66 year old male, we had seen him years ago for left foot and ankle pain, he ultimately had a calcaneocuboid injection that provided good relief, he also continues with his custom molded orthotics. Not taking any NSAIDs or Tylenol. On exam today he has an effusion that feels to be associated with the tibiotalar joint, and more of a sensation of tightness rather than pain, adding Mobic, updated x-rays. Continue orthotics. Would like to see him back may be 6 weeks from now.

## 2022-05-29 NOTE — Assessment & Plan Note (Signed)
[place] also has a history of osteoporosis, his initial T score was -2.4, he also had a vertebral compression fracture sustained with a low-energy trauma, so we started him on Fosamax and calcium/vitamin D. His updated bone density test showed an improvement in his T score. I would like to refill his medication, but I think we should also go ahead and check his testosterone, calcium, vitamin D. If his testosterone is low we could have him work with his primary care provider on repeating the testosterone which would help his bone density.

## 2022-05-29 NOTE — Progress Notes (Signed)
    Procedures performed today:    None.  Independent interpretation of notes and tests performed by another provider:   None.  Brief History, Exam, Impression, and Recommendations:    Left foot pain Jacob Blanchard returns, he is a pleasant 66 year old male, we had seen him years ago for left foot and ankle pain, he ultimately had a calcaneocuboid injection that provided good relief, he also continues with his custom molded orthotics. Not taking any NSAIDs or Tylenol. On exam today he has an effusion that feels to be associated with the tibiotalar joint, and more of a sensation of tightness rather than pain, adding Mobic, updated x-rays. Continue orthotics. Would like to see him back may be 6 weeks from now.  Osteoporosis Jacob Blanchard also has a history of osteoporosis, his initial T score was -2.4, he also had a vertebral compression fracture sustained with a low-energy trauma, so we started him on Fosamax and calcium/vitamin D. His updated bone density test showed an improvement in his T score. I would like to refill his medication, but I think we should also go ahead and check his testosterone, calcium, vitamin D. If his testosterone is low we could have him work with his primary care provider on repeating the testosterone which would help his bone density.   ____________________________________________ Gwen Her. Dianah Field, M.D., ABFM., CAQSM., AME. Primary Care and Sports Medicine [facility]  Adjunct Professor of [place] [employer]  Risk manager

## 2022-06-03 LAB — COMPLETE METABOLIC PANEL WITH GFR
AG Ratio: 1.8 (calc) (ref 1.0–2.5)
ALT: 26 U/L (ref 9–46)
AST: 23 U/L (ref 10–35)
Albumin: 4.5 g/dL (ref 3.6–5.1)
Alkaline phosphatase (APISO): 60 U/L (ref 35–144)
BUN: 10 mg/dL (ref 7–25)
CO2: 27 mmol/L (ref 20–32)
Calcium: 9.8 mg/dL (ref 8.6–10.3)
Chloride: 100 mmol/L (ref 98–110)
Creat: 0.85 mg/dL (ref 0.70–1.35)
Globulin: 2.5 g/dL (calc) (ref 1.9–3.7)
Glucose, Bld: 83 mg/dL (ref 65–99)
Potassium: 4.7 mmol/L (ref 3.5–5.3)
Sodium: 135 mmol/L (ref 135–146)
Total Bilirubin: 0.7 mg/dL (ref 0.2–1.2)
Total Protein: 7 g/dL (ref 6.1–8.1)
eGFR: 95 mL/min/{1.73_m2} (ref >=60)

## 2022-06-03 LAB — CBC
HCT: 41.5 % (ref 38.5–50.0)
Hemoglobin: 14.4 g/dL (ref 13.2–17.1)
MCH: 33.3 pg — ABNORMAL HIGH (ref 27.0–33.0)
MCHC: 34.7 g/dL (ref 32.0–36.0)
MCV: 95.8 fL (ref 80.0–100.0)
MPV: 9.5 fL (ref 7.5–12.5)
Platelets: 362 10*3/uL (ref 140–400)
RBC: 4.33 10*6/uL (ref 4.20–5.80)
RDW: 11.7 % (ref 11.0–15.0)
WBC: 4.5 10*3/uL (ref 3.8–10.8)

## 2022-06-03 LAB — VITAMIN D 25 HYDROXY (VIT D DEFICIENCY, FRACTURES): Vit D, 25-Hydroxy: 31 ng/mL (ref 30–100)

## 2022-06-03 LAB — TESTOSTERONE, FREE & TOTAL
Free Testosterone: 35.9 pg/mL (ref 35.0–155.0)
Testosterone, Total, LC-MS-MS: 588 ng/dL (ref 250–1100)

## 2022-06-18 DIAGNOSIS — M50323 Other cervical disc degeneration at C6-C7 level: Secondary | ICD-10-CM | POA: Diagnosis not present

## 2022-06-18 DIAGNOSIS — M9901 Segmental and somatic dysfunction of cervical region: Secondary | ICD-10-CM | POA: Diagnosis not present

## 2022-06-18 DIAGNOSIS — M50321 Other cervical disc degeneration at C4-C5 level: Secondary | ICD-10-CM | POA: Diagnosis not present

## 2022-06-18 DIAGNOSIS — M50322 Other cervical disc degeneration at C5-C6 level: Secondary | ICD-10-CM | POA: Diagnosis not present

## 2022-07-01 IMAGING — DX DG ANKLE COMPLETE 3+V*R*
3 series · 3 of 3 positions shown · non-contrast
Comparison: None.

CLINICAL DATA: Right foot and ankle pain, swelling and tenderness
since rolling foot/ankle this morning walking down steps.

EXAM:
RIGHT ANKLE - COMPLETE 3+ VIEW

[ankle ap]
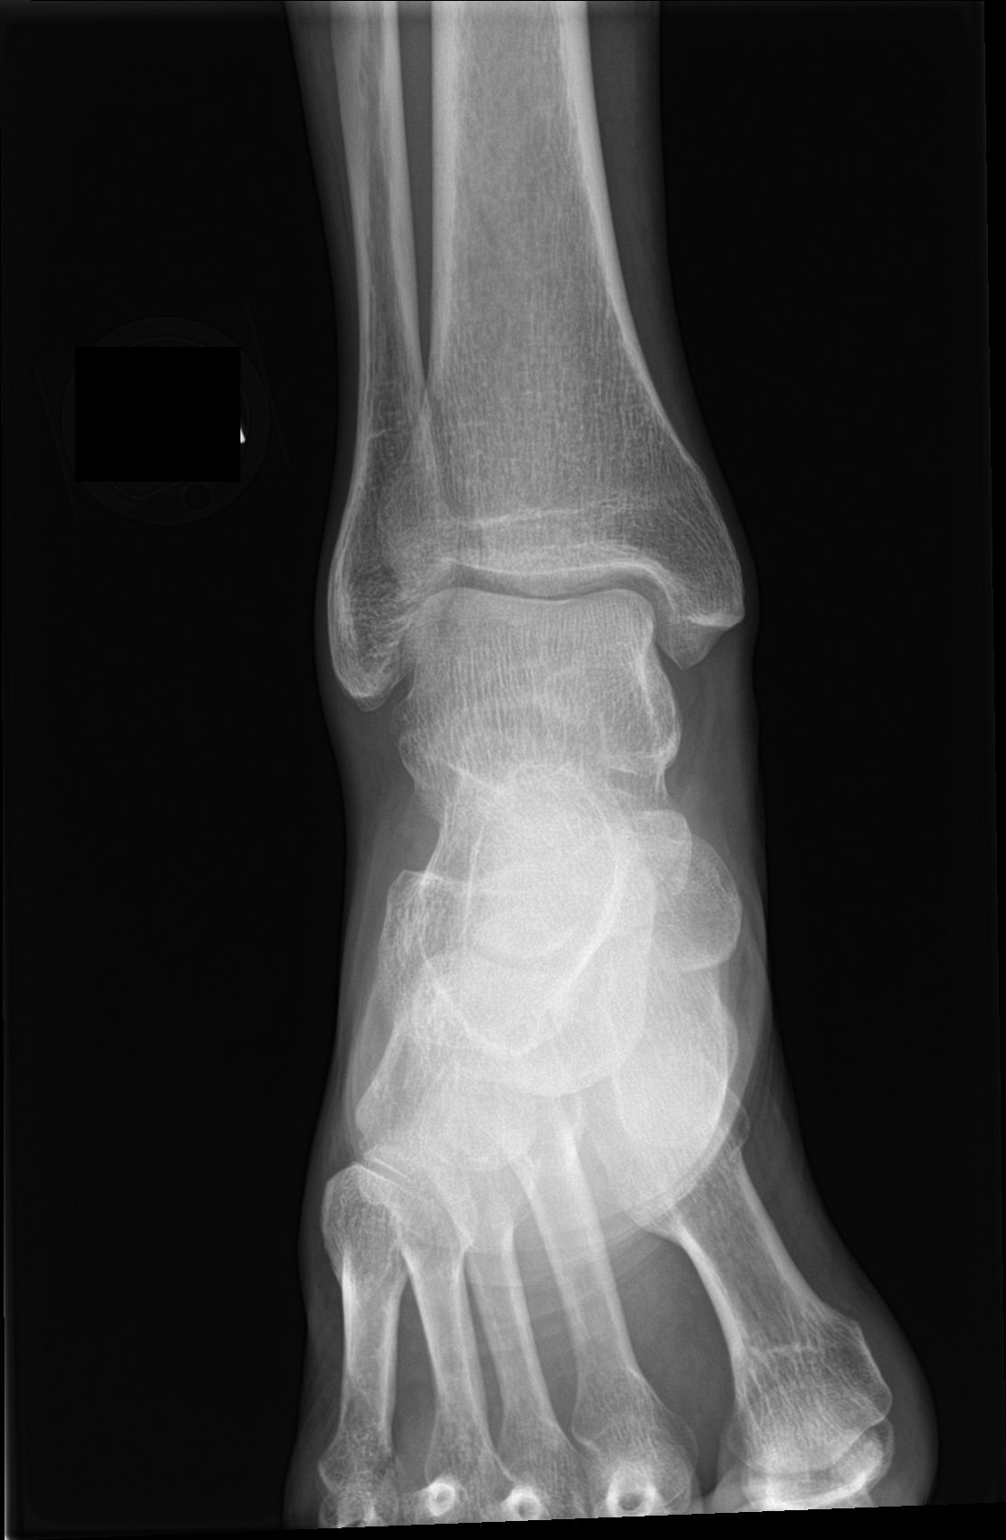

[ankle obl]
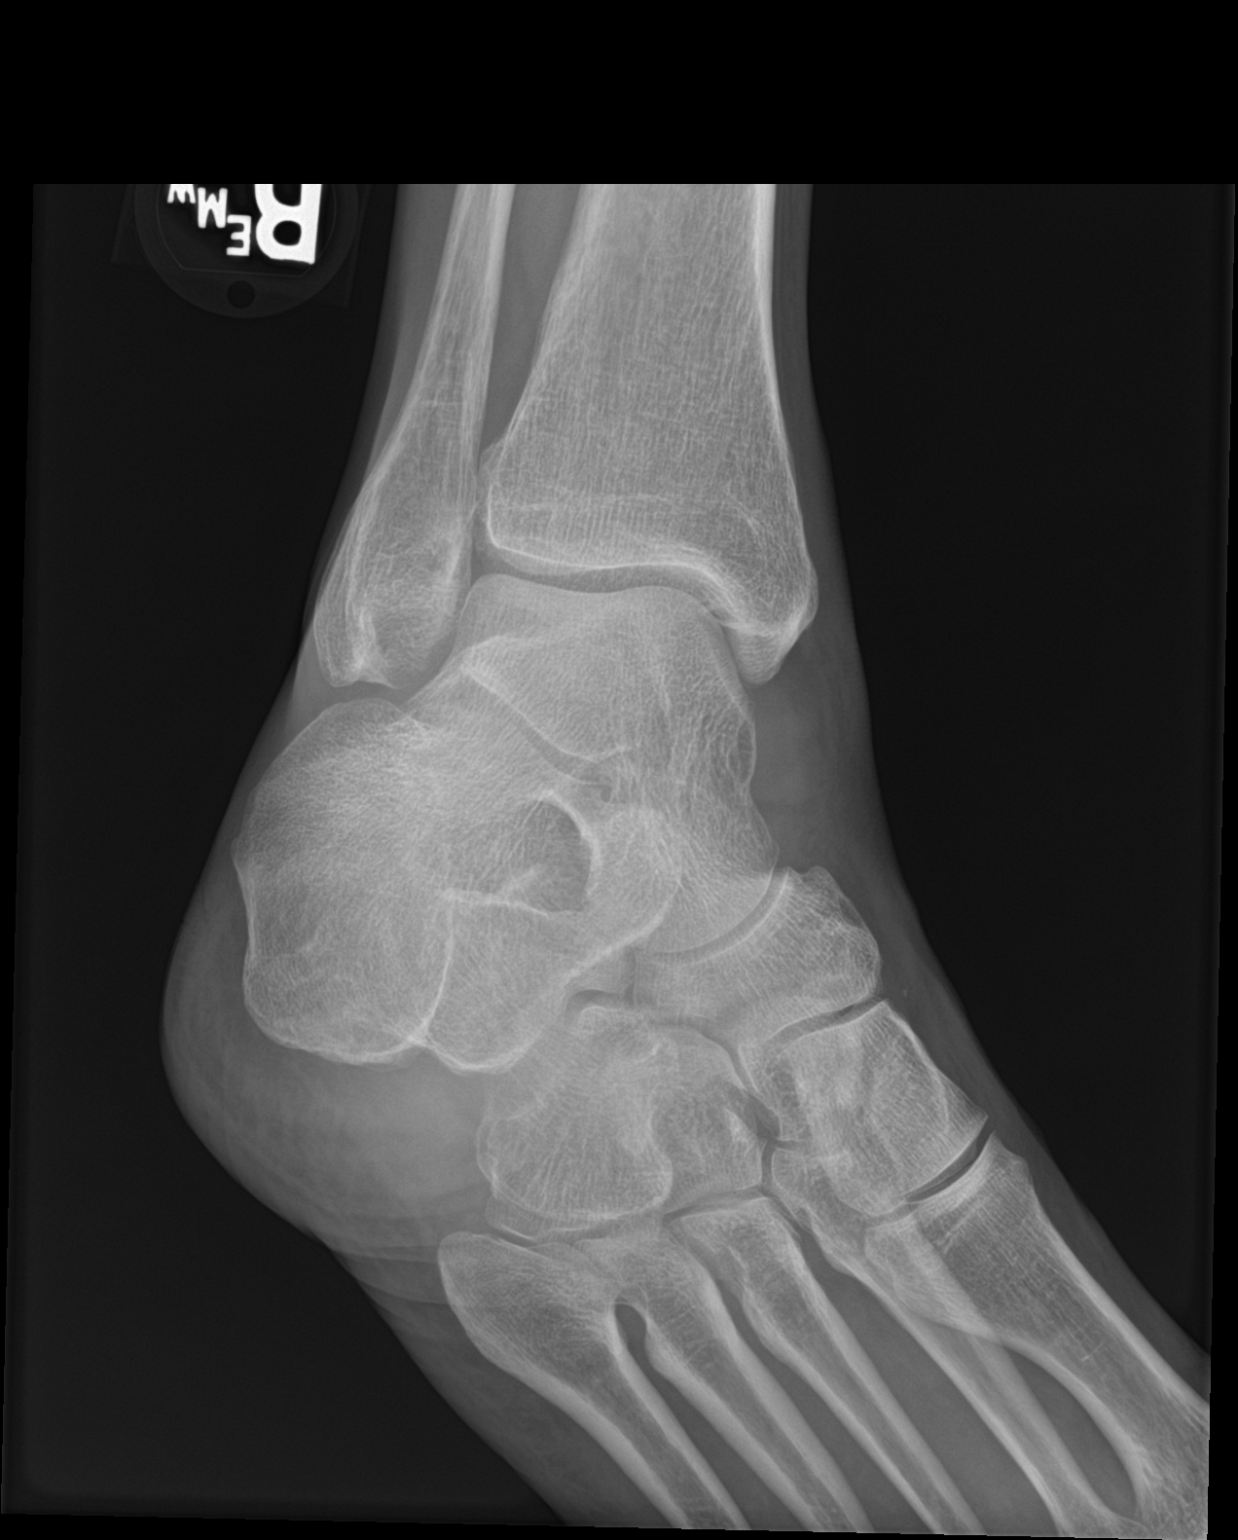

[ankle lat]
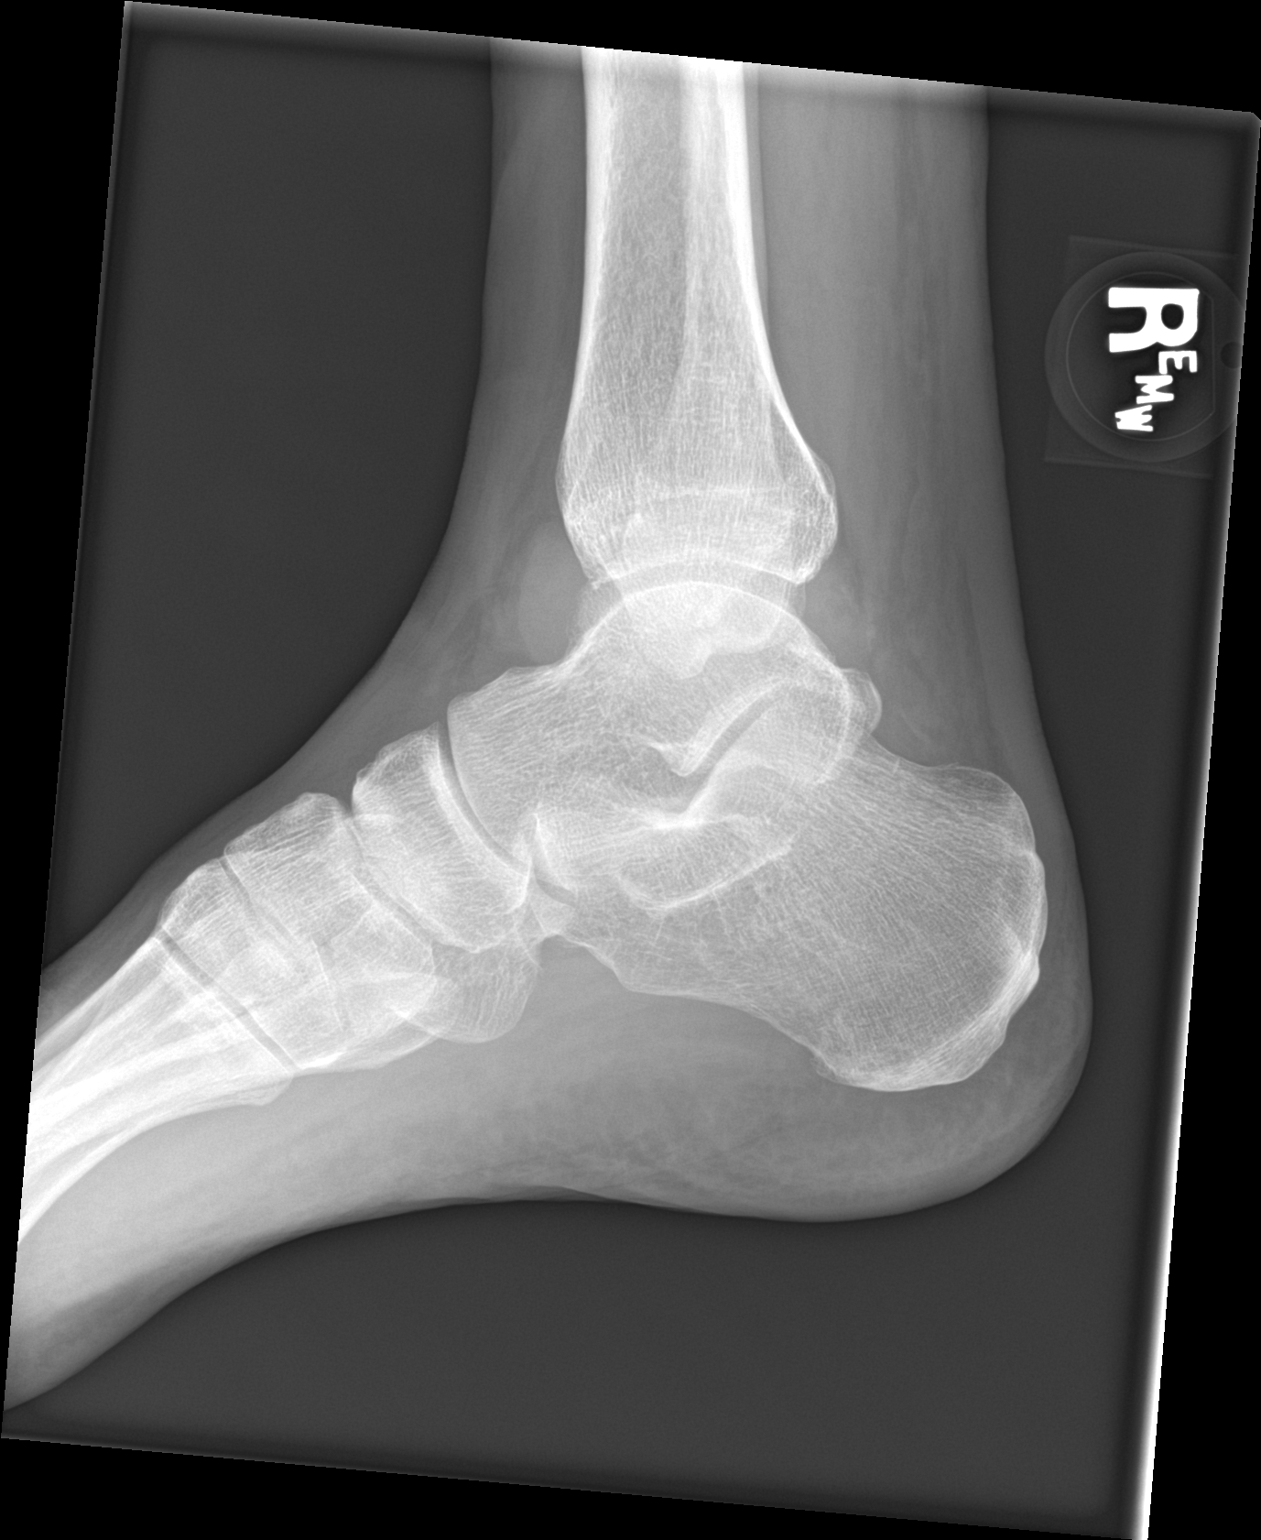

[3 of 3 positions shown; findings below may reference images not displayed]

FINDINGS: There is no evidence of fracture or dislocation. Ankle mortise is
preserved. There is a small ankle joint effusion. Small plantar
calcaneal spur. Soft tissues are unremarkable.
IMPRESSION: 1. No fracture or dislocation of the right ankle.
2. Small ankle joint effusion.

## 2022-07-01 IMAGING — DX DG FOOT COMPLETE 3+V*R*
3 series · 3 of 3 positions shown · non-contrast
Comparison: None.

CLINICAL DATA: Right foot and ankle pain, swelling and tenderness
since rolling foot/ankle this morning walking down steps.

EXAM:
RIGHT FOOT COMPLETE - 3+ VIEW

[foot ap]
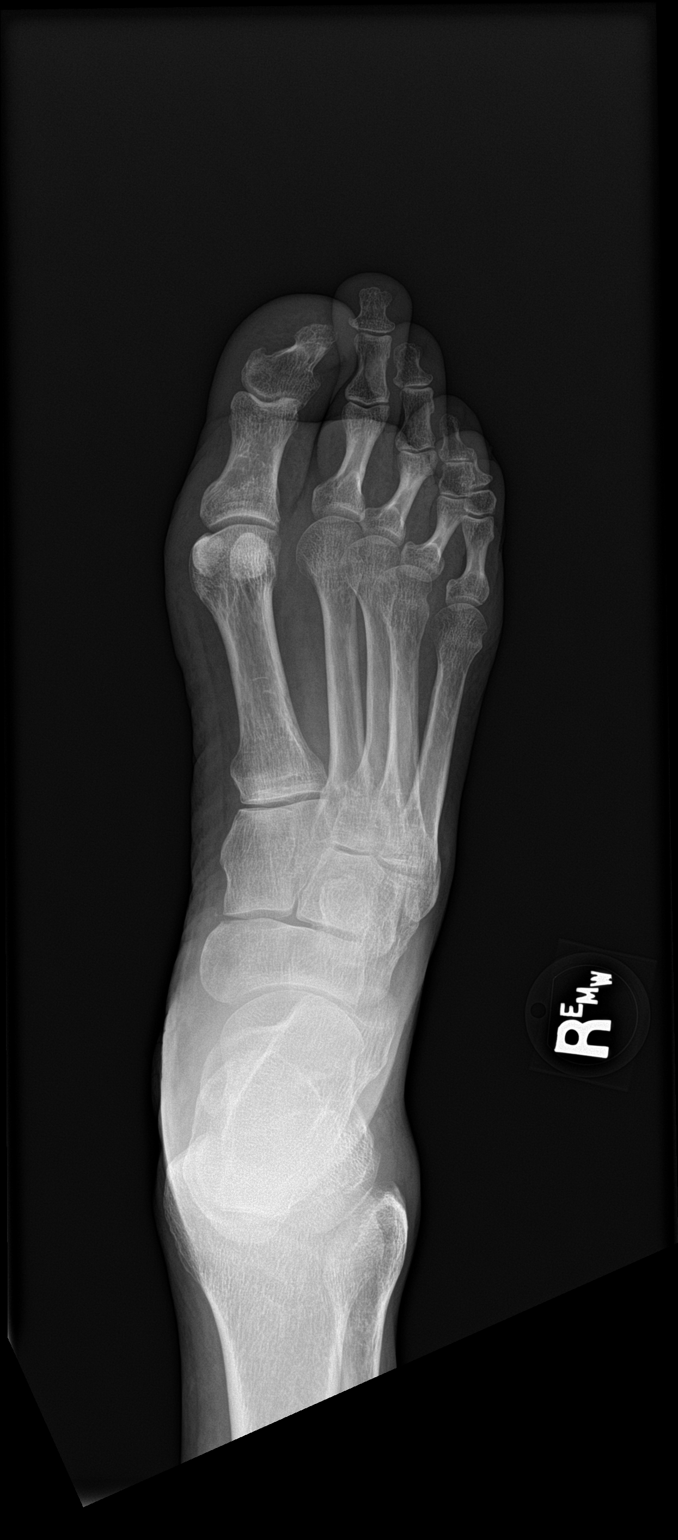

[foot obl]
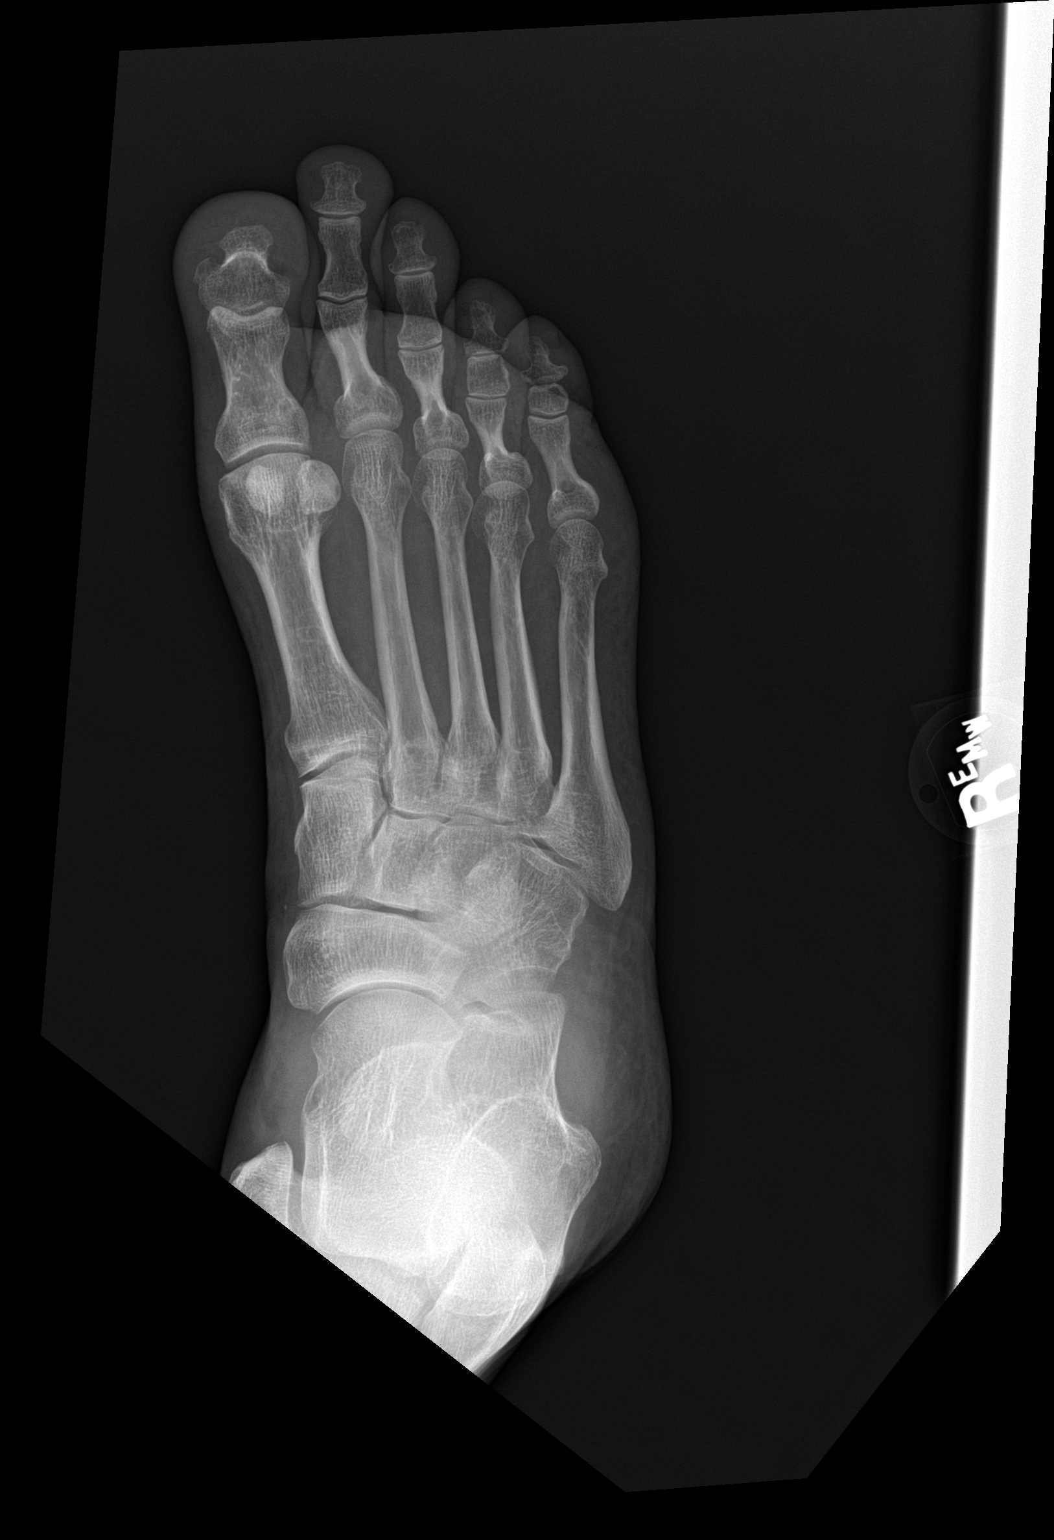

[foot lat]
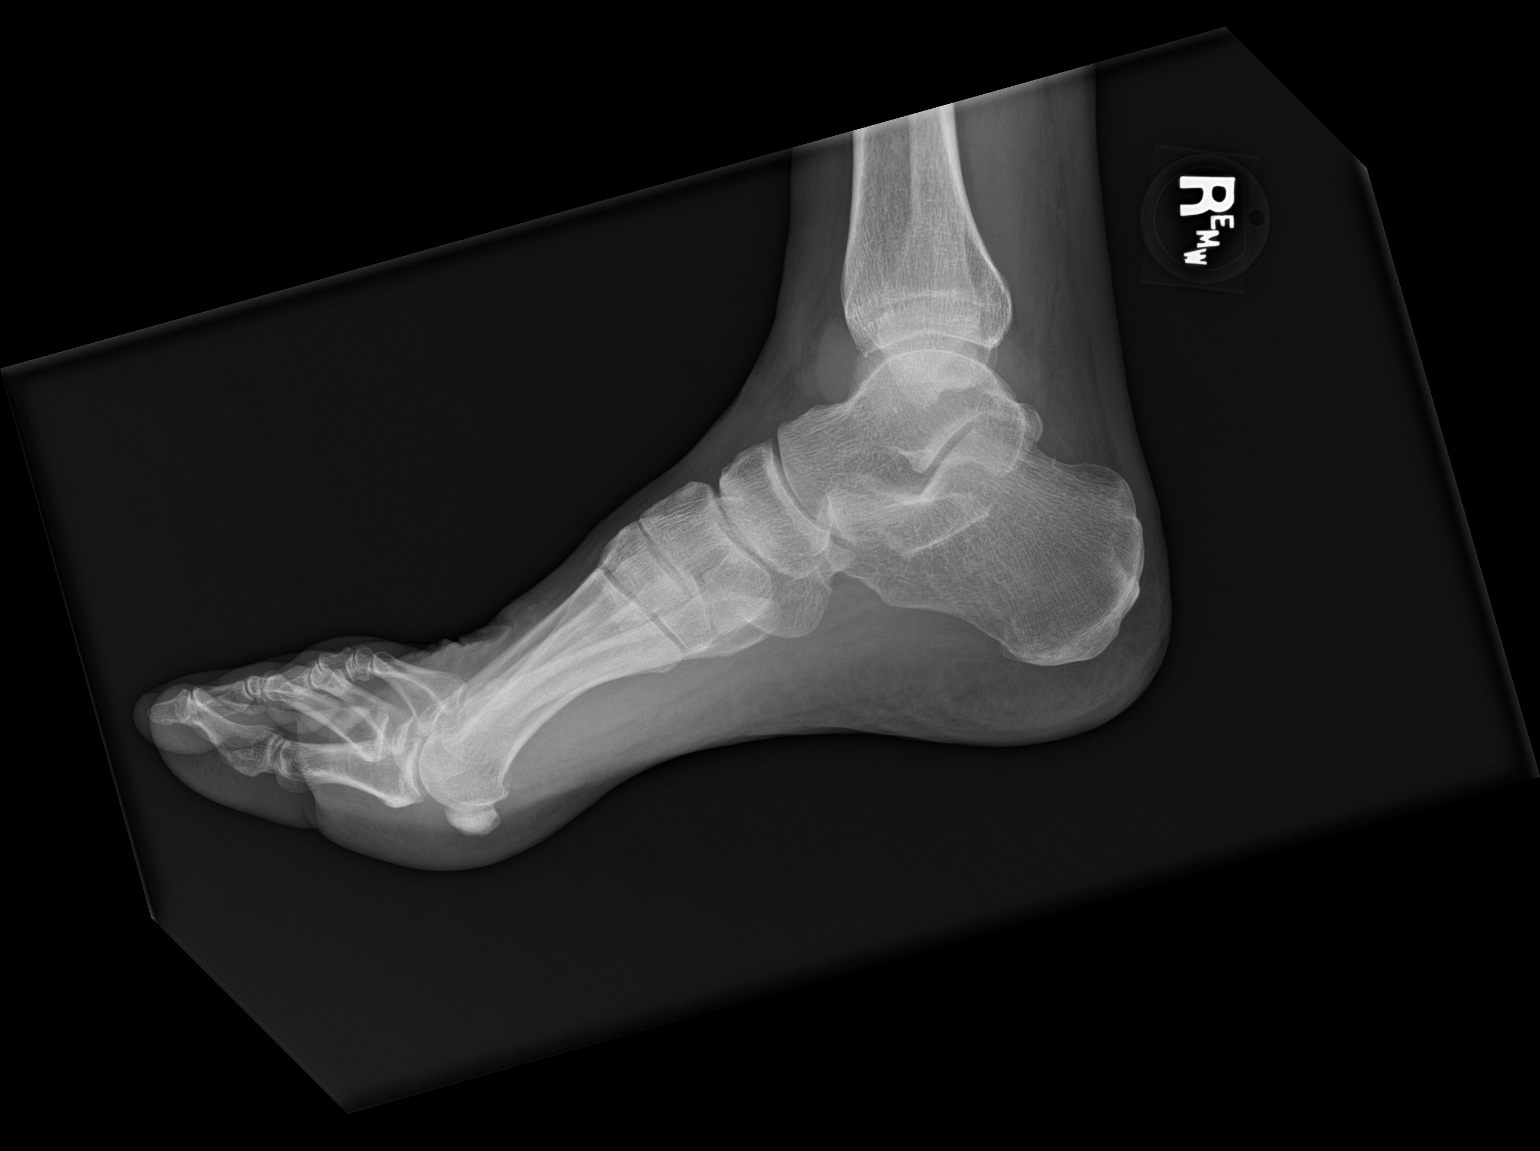

[3 of 3 positions shown; findings below may reference images not displayed]

FINDINGS: There is no evidence of fracture or dislocation. Mild hammertoe
deformity of the third and fourth digits. No significant
arthropathy. No focal soft tissue abnormality.
IMPRESSION: No fracture or subluxation of the right foot.

## 2022-07-09 ENCOUNTER — Ambulatory Visit: Payer: BC Managed Care – PPO | Admitting: Sports Medicine

## 2022-07-09 DIAGNOSIS — M79672 Pain in left foot: Secondary | ICD-10-CM | POA: Diagnosis not present

## 2022-07-09 MED ORDER — IBUPROFEN 200 MG PO TABS: 800.0000 mg | ORAL_TABLET | Freq: Three times a day (TID) | ORAL | Status: AC | PRN

## 2022-07-09 NOTE — Progress Notes (Signed)
    Procedures performed today:    None.  Independent interpretation of notes and tests performed by another provider:   None.  Brief History, Exam, Impression, and Recommendations:    Left foot pain Jacob Blanchard returns, he is a very pleasant 66 year old male, we saw him about 6 weeks ago for left foot and ankle pain, he ended up having a calcaneocuboid injection that provided good relief, continues with custom molded orthotics. He returned and had an effusion, this feels to be associated with the tibiotalar joint, we added meloxicam, updated x-rays. X-rays were for the most part unrevealing with only minimal degenerative changes, he took the meloxicam for several days and then stopped, he really does not have any foot or ankle pain right now but just lower extremity swelling, from the knee down, 2+ pitting edema. Does have a history of vein stripping, I do suspect that the swelling which is his major problem now is likely related to venous insufficiency, and I advised him he needs to talk to his primary care provider regarding this. From a foot and ankle standpoint he is now pointing distal to the lateral malleolus, at this point he is failed greater than 6 weeks of physician directed conservative treatment so we will go ahead and get an MRI to ensure not missing something here.  Return to see me to go over MRI results. In the meantime.  Meloxicam and switch back to Advil which worked well for him.    ____________________________________________ Gwen Her. Dianah Field, M.D., ABFM., CAQSM., AME. Primary Care and Sports Medicine [facility]  Adjunct Professor of [place] [employer] [facility] [employer]  Risk manager

## 2022-07-09 NOTE — Assessment & Plan Note (Signed)
Jacob Blanchard returns, he is a very pleasant 66 year old male, we saw him about 6 weeks ago for left foot and ankle pain, he ended up having a calcaneocuboid injection that provided good relief, continues with custom molded orthotics. He returned and had an effusion, this feels to be associated with the tibiotalar joint, we added meloxicam, updated x-rays. X-rays were for the most part unrevealing with only minimal degenerative changes, he took the meloxicam for several days and then stopped, he really does not have any foot or ankle pain right now but just lower extremity swelling, from the knee down, 2+ pitting edema. Does have a history of vein stripping, I do suspect that the swelling which is his major problem now is likely related to venous insufficiency, and I advised him he needs to talk to his primary care provider regarding this. From a foot and ankle standpoint he is now pointing distal to the lateral malleolus, at this point he is failed greater than 6 weeks of physician directed conservative treatment so we will go ahead and get an MRI to ensure not missing something here.  Return to see me to go over MRI results. In the meantime.  Meloxicam and switch back to Advil which worked well for him.

## 2022-07-16 ENCOUNTER — Telehealth: Payer: Self-pay | Admitting: Sports Medicine

## 2022-07-16 DIAGNOSIS — L905 Scar conditions and fibrosis of skin: Secondary | ICD-10-CM | POA: Diagnosis not present

## 2022-07-16 DIAGNOSIS — Z129 Encounter for screening for malignant neoplasm, site unspecified: Secondary | ICD-10-CM | POA: Diagnosis not present

## 2022-07-16 DIAGNOSIS — D2361 Other benign neoplasm of skin of right upper limb, including shoulder: Secondary | ICD-10-CM | POA: Diagnosis not present

## 2022-07-16 DIAGNOSIS — D485 Neoplasm of uncertain behavior of skin: Secondary | ICD-10-CM | POA: Diagnosis not present

## 2022-07-16 DIAGNOSIS — L570 Actinic keratosis: Secondary | ICD-10-CM | POA: Diagnosis not present

## 2022-07-16 NOTE — Telephone Encounter (Signed)
Patient called left voice mail to follow up with MRI getting scheduled. Please follow up with patient.

## 2022-07-17 NOTE — Telephone Encounter (Signed)
No authorization is required for his MRI. The imaging dept was notified to contact patient for scheduling his study. Patient may call the Imaging Dept directly at [phone]. Thanks in advance.

## 2022-07-28 ENCOUNTER — Ambulatory Visit (INDEPENDENT_AMBULATORY_CARE_PROVIDER_SITE_OTHER): Payer: BC Managed Care – PPO

## 2022-07-28 DIAGNOSIS — M67472 Ganglion, left ankle and foot: Secondary | ICD-10-CM | POA: Diagnosis not present

## 2022-07-28 DIAGNOSIS — M65872 Other synovitis and tenosynovitis, left ankle and foot: Secondary | ICD-10-CM | POA: Diagnosis not present

## 2022-07-28 DIAGNOSIS — M7989 Other specified soft tissue disorders: Secondary | ICD-10-CM | POA: Diagnosis not present

## 2022-07-28 DIAGNOSIS — M79672 Pain in left foot: Secondary | ICD-10-CM

## 2022-07-30 DIAGNOSIS — M50323 Other cervical disc degeneration at C6-C7 level: Secondary | ICD-10-CM | POA: Diagnosis not present

## 2022-07-30 DIAGNOSIS — M9901 Segmental and somatic dysfunction of cervical region: Secondary | ICD-10-CM | POA: Diagnosis not present

## 2022-07-30 DIAGNOSIS — M50322 Other cervical disc degeneration at C5-C6 level: Secondary | ICD-10-CM | POA: Diagnosis not present

## 2022-07-30 DIAGNOSIS — M50321 Other cervical disc degeneration at C4-C5 level: Secondary | ICD-10-CM | POA: Diagnosis not present

## 2022-08-06 ENCOUNTER — Encounter: Payer: Self-pay | Admitting: Family Medicine

## 2022-08-06 ENCOUNTER — Ambulatory Visit (INDEPENDENT_AMBULATORY_CARE_PROVIDER_SITE_OTHER): Payer: BC Managed Care – PPO

## 2022-08-06 ENCOUNTER — Ambulatory Visit: Payer: BC Managed Care – PPO | Admitting: Family Medicine

## 2022-08-06 VITALS — BP 159/102 | HR 103 | Temp 98.0°F | Ht 70.0 in | Wt 154.6 lb

## 2022-08-06 DIAGNOSIS — M7989 Other specified soft tissue disorders: Secondary | ICD-10-CM | POA: Diagnosis not present

## 2022-08-06 DIAGNOSIS — M25472 Effusion, left ankle: Secondary | ICD-10-CM

## 2022-08-06 DIAGNOSIS — M67472 Ganglion, left ankle and foot: Secondary | ICD-10-CM | POA: Diagnosis not present

## 2022-08-06 DIAGNOSIS — Z23 Encounter for immunization: Secondary | ICD-10-CM

## 2022-08-06 NOTE — Progress Notes (Signed)
OFFICE VISIT  08/06/2022  CC: L ankle concern  Patient is a 66 y.o. male who presents for left ankle swelling and tightness.  HPI: Reports an approximately 2 to 73-monthhistory of swelling in the left ankle.  No erythema or warmth.  Denies pain but says he feels like it is "tight".  No preceding injury or overuse.  No lower leg pain.  He does feel like the lower leg is more of a purple color than in the past.  He has a remote history of a vein stripping procedure for varicose veins in the left lower extremity.  However he says the swelling and color changes were not present until recently.  No prolonged immobilization, no recent surgical procedures, no personal or family history of thrombosis.  He does not smoke.  He saw Dr. TDianah Field8/25/2023 for left ankle tightness and swelling.  Impression was left foot tibiotalar stiffness, orthotics were the plan.  He returned on 07/09/2022 for follow-up and had 2+ left lower leg pitting consistent with venous insufficiency (hx of vein stripping in this leg).  Most of his discomfort at that point was around lateral malleolus.  MRI ankle ordered. MRI left ankle 07/28/2022-> mild tenosynovitis of posterior tibial tendon.  Small ganglion cyst deep to the extensor digitorum tendons at the level of the talar head/neck in the anterolateral aspect of the ankle.  Extensive superficial soft tissue swelling of the ankle.  Past Medical History:  Diagnosis Date   Anxiety    prn xanax started 01/2019->self d/c'd in fall/winter 2020   Barrett's esophagus    Bx+ 2007, 2009, and 2020.   Chronic constipation    GI->Linzess trial 05/2019->helpful   Contact dermatitis and other eczema, due to unspecified cause    +scarring   Diverticulosis 2021   noted on CT abd 06/2020   Epididymal cyst 06/2018   Bilat: small on R, but 4 cm on left  Asymptomatic.   GERD (gastroesophageal reflux disease)    History of adenomatous polyp of colon 08/10/2006; 01/2020   2007 and 12/2018;  01/2020, recall 2024   Hyperlipidemia    Incisional hernia    Left foot pain    ? Arch strain.  Some numbness to suggest possible tarsal tunnel syndrome vs lumbar radiculitis (sports med MD).  Responded very well to custom orthotic.   Migraine syndrome    verapamil prophylaxis, cluster migraine   Pseudophakia of both eyes    Seasonal and perennial allergic rhinitis    vaccine/injection therapy helpful   [place]   surgery->lysis of adhesions   [place]   T11 and T12 and L 1, minimal trauma.  Dr. TDarene Lamerput him on fosamax.    Past Surgical History:  Procedure Laterality Date   ABDOMINAL EXPLORATION SURGERY  Fall 2019   SBO->lysis of adhesions   COLONOSCOPY W/ POLYPECTOMY  08/10/06; 12/2018   Several adenomatous polyps 2007 and 2020; 01-25-2020. recall 2024   DEXA  05/24/2019   Dr. TBartholome Bill-2.4 + vert comp fx->fosamax started.   ESOPHAGOGASTRODUODENOSCOPY  2007 and 07/20/08;12/2018   GERD, BARRET's esoph (Dr. PHenrene Pastor. Same 12/2018.   INCISIONAL HERNIA REPAIR N/A 02/08/2020   Procedure: LAPAROSCOPIC INCISIONAL HERNIA REPAIR WITH MESH;  Surgeon: Kinsinger, LArta Bruce MD;  [facility]  Service: General;  Laterality: N/A;   INTRAOCULAR LENS INSERTION  09/03/2020   OU   LE venous doppler u/s  04/19/15   L leg NORMAL  right knee arthroscopy  1980s   Torn meniscus (Dr. Noemi Chapel)    Outpatient Medications Prior to Visit  Medication Sig Dispense Refill   alendronate (FOSAMAX) 70 MG tablet Take 1 tablet (70 mg total) by mouth every 7 (seven) days. Take with a full glass of water on an empty stomach. 12 tablet 3   atorvastatin (LIPITOR) 40 MG tablet TAKE 1 TABLET BY MOUTH EVERY DAY 90 tablet 3   Calcium Carbonate-Vitamin D3 (CALCIUM + VITAMIN D3) 600-400 MG-UNIT TABS Take 1 tablet by mouth 2 (two) times daily. 180 tablet 3   cyclobenzaprine (FLEXERIL) 10 MG tablet TAKE 1 TABLET BY MOUTH TWICE A DAY AS  NEEDED 30 tablet 1   dicyclomine (BENTYL) 10 MG capsule TAKE 1 CAPSULE BY MOUTH TWICE A DAY 180 capsule 1   fluticasone (FLONASE) 50 MCG/ACT nasal spray SPRAY 2 SPRAYS INTO EACH NOSTRIL EVERY DAY 48 mL 1   ibuprofen (ADVIL) 200 MG tablet Take 4 tablets (800 mg total) by mouth every 8 (eight) hours as needed.     lansoprazole (PREVACID) 30 MG capsule Take 1 capsule (30 mg total) by mouth 2 (two) times daily. 180 capsule 3   linaclotide (LINZESS) 290 MCG CAPS capsule Take 1 capsule (290 mcg total) by mouth daily before breakfast. 30 capsule 11   verapamil (CALAN) 120 MG tablet Take 60 mg by mouth once. For migraines     polyethylene glycol powder (GLYCOLAX/MIRALAX) 17 GM/SCOOP powder Take by mouth. (Patient not taking: Reported on 08/06/2022)     pseudoephedrine (SUDAFED) 30 MG tablet Take by mouth. (Patient not taking: Reported on 08/06/2022)     No facility-administered medications prior to visit.    Allergies  Allergen Reactions   Oxycodone     nausea    ROS As per HPI  PE:    08/06/2022   11:09 AM 08/06/2022   10:32 AM 05/29/2022    9:09 AM  Vitals with BMI  Height  _0  _1   Weight  154 lbs 10 oz 150 lbs  BMI  40.98 11.91  Systolic 478 295 621  Diastolic 308 657 86  Pulse  103 80    Physical Exam  Gen: Alert, well appearing.  Patient is oriented to person, place, time, and situation. AFFECT: pleasant, lucid thought and speech. Left lower leg down into the ankle has mildly violaceous hue, some spider veins present as well. No tenderness.  Minimal pitting edema in the ankle.  Diffuse soft tissue swelling noted around the ankle, more prominent medially.  No warmth or erythema. Range of motion of the ankle and foot intact.  Strength of ankle and foot fully intact. No significant arch abnormalities.  LABS:  Last CBC Lab Results  Component Value Date   WBC 4.5 05/29/2022   HGB 14.4 05/29/2022   HCT 41.5 05/29/2022   MCV 95.8 05/29/2022   MCH 33.3 (H) 05/29/2022    RDW 11.7 05/29/2022   PLT 362 84/69/6295   Last metabolic panel Lab Results  Component Value Date   GLUCOSE 83 05/29/2022   NA 135 05/29/2022   K 4.7 05/29/2022   CL 100 05/29/2022   CO2 27 05/29/2022   BUN 10 05/29/2022   CREATININE 0.85 05/29/2022   EGFR 95 05/29/2022   CALCIUM 9.8 05/29/2022   PROT 7.0 05/29/2022   ALBUMIN 4.4 04/02/2022   BILITOT 0.7 05/29/2022   ALKPHOS 73 04/02/2022   AST 23 05/29/2022   ALT 26 05/29/2022   Lab Results  Component Value  Date   TESTOSTERONE 588 05/29/2022    IMPRESSION AND PLAN:  #1 subacute left ankle swelling. Unclear etiology.  He has a few imaging abnormalities on MRI but hard to tell if these explain his symptoms or not. The ganglion cyst is very small. I think it makes sense to go ahead and rule out DVT at this point.  Venous Doppler ordered. If negative and if symptoms become more prominent could take a look again and possibly aspirate/inject the ganglion cyst.  An After Visit Summary was printed and given to the patient.  FOLLOW UP: Return in about 2 weeks (around 08/20/2022) for recheck ankle.  Signed:  Crissie Sickles, MD           08/06/2022

## 2022-08-07 ENCOUNTER — Ambulatory Visit (INDEPENDENT_AMBULATORY_CARE_PROVIDER_SITE_OTHER): Payer: BC Managed Care – PPO

## 2022-08-07 DIAGNOSIS — M7989 Other specified soft tissue disorders: Secondary | ICD-10-CM

## 2022-08-10 ENCOUNTER — Other Ambulatory Visit: Payer: Self-pay | Admitting: Physician Assistant

## 2022-08-11 NOTE — Telephone Encounter (Signed)
Patient is requesting Linzess. Please advise.

## 2022-08-11 NOTE — Telephone Encounter (Signed)
Patient needs an appointment for future refills.

## 2022-08-21 ENCOUNTER — Encounter: Payer: Self-pay | Admitting: Family Medicine

## 2022-08-21 ENCOUNTER — Ambulatory Visit: Payer: BC Managed Care – PPO | Admitting: Family Medicine

## 2022-08-21 VITALS — BP 115/79 | HR 79 | Temp 97.6°F | Ht 70.0 in | Wt 155.8 lb

## 2022-08-21 DIAGNOSIS — M25472 Effusion, left ankle: Secondary | ICD-10-CM | POA: Diagnosis not present

## 2022-08-21 DIAGNOSIS — R030 Elevated blood-pressure reading, without diagnosis of hypertension: Secondary | ICD-10-CM | POA: Diagnosis not present

## 2022-08-21 NOTE — Progress Notes (Signed)
OFFICE VISIT  08/21/2022  CC: f/u L ankle  Patient is a 66 y.o. male who presents for 2 wk f/u L ankle swelling and elevated blood pressure without diagnosis of hypertension. A/P as of last visit: "#1 subacute left ankle swelling. Unclear etiology.  He has a few imaging abnormalities on MRI but hard to tell if these explain his symptoms or not. The ganglion cyst is very small. I think it makes sense to go ahead and rule out DVT at this point.  Venous Doppler ordered. If negative and if symptoms become more prominent could take a look again and possibly aspirate/inject the ganglion cyst"  INTERIM HX: Dopplers neg for DVT 08/07/22. Feeling well, he climbed ladders all day last week and had no pain or disability of his ankle at all.  He has checked home blood pressures since last visit and they are consistently less than 130/80.   Past Medical History:  Diagnosis Date   Anxiety    prn xanax started 01/2019->self d/c'd in fall/winter 2020   Barrett's esophagus    Bx+ 2007, 2009, and 2020.   Chronic constipation    GI->Linzess trial 05/2019->helpful   Contact dermatitis and other eczema, due to unspecified cause    +scarring   Diverticulosis 2021   noted on CT abd 06/2020   Epididymal cyst 06/2018   Bilat: small on R, but 4 cm on left  Asymptomatic.   GERD (gastroesophageal reflux disease)    History of adenomatous polyp of colon 08/10/2006; 01/2020   2007 and 12/2018; 01/2020, recall 2024   Hyperlipidemia    Incisional hernia    Left foot pain    ? Arch strain.  Some numbness to suggest possible tarsal tunnel syndrome vs lumbar radiculitis (sports med MD).  Responded very well to custom orthotic.   Migraine syndrome    verapamil prophylaxis, cluster migraine   Pseudophakia of both eyes    Seasonal and perennial allergic rhinitis    vaccine/injection therapy helpful   Small bowel obstruction ([place]) Fall of 2019   surgery->lysis of adhesions   Vertebral compression fracture ([place])  05/2019   T11 and T12 and L 1, minimal trauma.  Dr. Darene Lamer put him on fosamax.    Past Surgical History:  Procedure Laterality Date   ABDOMINAL EXPLORATION SURGERY  Fall 2019   SBO->lysis of adhesions   COLONOSCOPY W/ POLYPECTOMY  08/10/06; 12/2018   Several adenomatous polyps 2007 and 2020; 01-25-2020. recall 2024   DEXA  05/24/2019   Dr. Bartholome Bill -2.4 + vert comp fx->fosamax started.   ESOPHAGOGASTRODUODENOSCOPY  2007 and 07/20/08;12/2018   GERD, BARRET's esoph (Dr. Henrene Pastor). Same 12/2018.   INCISIONAL HERNIA REPAIR N/A 02/08/2020   Procedure: LAPAROSCOPIC INCISIONAL HERNIA REPAIR WITH MESH;  Surgeon: Kinsinger, Arta Bruce, MD;  Location: [facility];  Service: General;  Laterality: N/A;   INTRAOCULAR LENS INSERTION  09/03/2020   OU   LE venous doppler u/s  04/19/15   L leg NORMAL   right knee arthroscopy  1980s   Torn meniscus (Dr. Noemi Chapel)    Outpatient Medications Prior to Visit  Medication Sig Dispense Refill   alendronate (FOSAMAX) 70 MG tablet Take 1 tablet (70 mg total) by mouth every 7 (seven) days. Take with a full glass of water on an empty stomach. 12 tablet 3   atorvastatin (LIPITOR) 40 MG tablet TAKE 1 TABLET BY MOUTH EVERY DAY 90 tablet 3   Calcium Carbonate-Vitamin D3 (CALCIUM + VITAMIN D3) 600-400 MG-UNIT TABS Take 1 tablet  by mouth 2 (two) times daily. 180 tablet 3   cyclobenzaprine (FLEXERIL) 10 MG tablet TAKE 1 TABLET BY MOUTH TWICE A DAY AS NEEDED 30 tablet 1   dicyclomine (BENTYL) 10 MG capsule TAKE 1 CAPSULE BY MOUTH TWICE A DAY 180 capsule 1   fluticasone (FLONASE) 50 MCG/ACT nasal spray SPRAY 2 SPRAYS INTO EACH NOSTRIL EVERY DAY 48 mL 1   ibuprofen (ADVIL) 200 MG tablet Take 4 tablets (800 mg total) by mouth every 8 (eight) hours as needed.     lansoprazole (PREVACID) 30 MG capsule Take 1 capsule (30 mg total) by mouth 2 (two) times daily. 180 capsule 3   LINZESS 290 MCG CAPS capsule TAKE 1 CAPSULE BY MOUTH DAILY BEFORE BREAKFAST. 90 capsule 3    polyethylene glycol powder (GLYCOLAX/MIRALAX) 17 GM/SCOOP powder Take by mouth.     pseudoephedrine (SUDAFED) 30 MG tablet Take by mouth.     verapamil (CALAN) 120 MG tablet Take 60 mg by mouth once. For migraines     No facility-administered medications prior to visit.    Allergies  Allergen Reactions   Oxycodone     nausea    ROS As per HPI  PE:    08/21/2022    8:09 AM 08/06/2022   11:09 AM 08/06/2022   10:32 AM  Vitals with BMI  Height 5' 10"  5' 10"  Weight 155 lbs 13 oz  154 lbs 10 oz  BMI 11.57  26.20  Systolic 355 974 163  Diastolic 79 845 364  Pulse 79  103     Physical Exam  Gen: Alert, well appearing.  Patient is oriented to person, place, time, and situation. AFFECT: pleasant, lucid thought and speech. L ankle with minimal soft tissue swelling, mostly around medial malleolus, mild TTP focally inf to med mall. ROM fully intact w/out pain.  LABS:  Last CBC Lab Results  Component Value Date   WBC 4.5 05/29/2022   HGB 14.4 05/29/2022   HCT 41.5 05/29/2022   MCV 95.8 05/29/2022   MCH 33.3 (H) 05/29/2022   RDW 11.7 05/29/2022   PLT 362 68/12/2120   Last metabolic panel Lab Results  Component Value Date   GLUCOSE 83 05/29/2022   NA 135 05/29/2022   K 4.7 05/29/2022   CL 100 05/29/2022   CO2 27 05/29/2022   BUN 10 05/29/2022   CREATININE 0.85 05/29/2022   EGFR 95 05/29/2022   CALCIUM 9.8 05/29/2022   PROT 7.0 05/29/2022   ALBUMIN 4.4 04/02/2022   BILITOT 0.7 05/29/2022   ALKPHOS 73 04/02/2022   AST 23 05/29/2022   ALT 26 05/29/2022   IMPRESSION AND PLAN:  Left ankle swelling.  He did have some pain at 1 point. Extensive imaging revealed mild posterior tibial tenosynovitis and a small ganglion cyst. He is essentially asymptomatic. Observation.  2.  Elevated blood pressure without diagnosis of hypertension. Normal at home and here today. Reassured.  Continue periodic home BP monitoring.  An After Visit Summary was printed and given to  the patient.  FOLLOW UP: Return for [facility] CPE appointment set for 10/02/2022.  Signed:  Crissie Sickles, MD           08/21/2022

## 2022-09-10 DIAGNOSIS — M50322 Other cervical disc degeneration at C5-C6 level: Secondary | ICD-10-CM | POA: Diagnosis not present

## 2022-09-10 DIAGNOSIS — M50321 Other cervical disc degeneration at C4-C5 level: Secondary | ICD-10-CM | POA: Diagnosis not present

## 2022-09-10 DIAGNOSIS — M9901 Segmental and somatic dysfunction of cervical region: Secondary | ICD-10-CM | POA: Diagnosis not present

## 2022-09-10 DIAGNOSIS — M50323 Other cervical disc degeneration at C6-C7 level: Secondary | ICD-10-CM | POA: Diagnosis not present

## 2022-09-16 ENCOUNTER — Encounter: Payer: Self-pay | Admitting: *Deleted

## 2022-09-16 ENCOUNTER — Telehealth: Payer: Self-pay | Admitting: *Deleted

## 2022-09-16 NOTE — Patient Instructions (Signed)
Visit Information  Thank you for taking time to visit with me today. Please don't hesitate to contact me if I can be of assistance to you.   Following are the goals we discussed today:   Goals Addressed               This Visit's Progress     COMPLETED: No needs (pt-stated)        Care Coordination Interventions: Reviewed medications with patient and discussed adherence with no needed refills Reviewed scheduled/upcoming provider appointments including sufficient transportation source Screening for signs and symptoms of depression related to chronic disease state  Assessed social determinant of health barriers          Please call the care guide team at [phone] if you need to cancel or reschedule your appointment.   If you are experiencing a Mental Health or [place] or need someone to talk to, please call the Suicide and Crisis Lifeline: 988  Patient verbalizes understanding of instructions and care plan provided today and agrees to view in [place]. Active MyChart status and patient understanding of how to access instructions and care plan via MyChart confirmed with patient.     No further follow up required: No further follow up needed  Raina Mina, RN Care Management Coordinator [place] Office [phone]

## 2022-09-16 NOTE — Patient Outreach (Signed)
  Care Coordination   Initial Visit Note   09/16/2022 Name: Jacob Blanchard MRN: [id number] DOB: [birth year]  Jacob Blanchard is a 66 y.o. year old male who sees McGowen, Adrian Blackwater, MD for primary care. I spoke with  Jacob Blanchard by phone today.  What matters to the patients health and wellness today?  No needs    Goals Addressed               This Visit's Progress     COMPLETED: No needs (pt-stated)        Care Coordination Interventions: Reviewed medications with patient and discussed adherence with no needed refills Reviewed scheduled/upcoming provider appointments including sufficient transportation source Screening for signs and symptoms of depression related to chronic disease state  Assessed social determinant of health barriers          SDOH assessments and interventions completed:  Yes  SDOH Interventions Today    Flowsheet Row Most Recent Value  SDOH Interventions   Food Insecurity Interventions Intervention Not Indicated  Housing Interventions Intervention Not Indicated  Transportation Interventions Intervention Not Indicated  Utilities Interventions Intervention Not Indicated        Care Coordination Interventions:  Yes, provided   Follow up plan: No further intervention required.   Encounter Outcome:  Pt. Visit Completed   Raina Mina, RN Care Management Coordinator [place] Office [phone]

## 2022-09-17 ENCOUNTER — Ambulatory Visit: Payer: BC Managed Care – PPO | Admitting: Physician Assistant

## 2022-09-17 ENCOUNTER — Encounter: Payer: Self-pay | Admitting: Physician Assistant

## 2022-09-17 ENCOUNTER — Other Ambulatory Visit (INDEPENDENT_AMBULATORY_CARE_PROVIDER_SITE_OTHER): Payer: BC Managed Care – PPO

## 2022-09-17 VITALS — BP 130/84 | HR 88 | Ht 70.0 in | Wt 155.4 lb

## 2022-09-17 DIAGNOSIS — K5909 Other constipation: Secondary | ICD-10-CM | POA: Diagnosis not present

## 2022-09-17 DIAGNOSIS — Z8719 Personal history of other diseases of the digestive system: Secondary | ICD-10-CM | POA: Diagnosis not present

## 2022-09-17 DIAGNOSIS — R109 Unspecified abdominal pain: Secondary | ICD-10-CM

## 2022-09-17 DIAGNOSIS — Z86010 Personal history of colonic polyps: Secondary | ICD-10-CM | POA: Diagnosis not present

## 2022-09-17 LAB — BASIC METABOLIC PANEL
BUN: 6 mg/dL (ref 6–23)
CO2: 29 mEq/L (ref 19–32)
Calcium: 9.7 mg/dL (ref 8.4–10.5)
Chloride: 98 mEq/L (ref 96–112)
Creatinine, Ser: 0.85 mg/dL (ref 0.40–1.50)
GFR: 89.59 mL/min (ref >60.00)
Glucose, Bld: 83 mg/dL (ref 70–99)
Potassium: 4.4 mEq/L (ref 3.5–5.1)
Sodium: 133 mEq/L — ABNORMAL LOW (ref 135–145)

## 2022-09-17 MED ORDER — LINACLOTIDE 290 MCG PO CAPS: ORAL_CAPSULE | ORAL | 3 refills | Status: DC

## 2022-09-17 NOTE — Patient Instructions (Addendum)
Your provider has requested that you go to the basement level for lab work before leaving today. Press "B" on the elevator. The lab is located at the first door on the left as you exit the elevator.  You have been scheduled for a CT scan of the abdomen and pelvis at [facility] [address] [place] [address] [place],  [place]  [address] Main: [id number] You are scheduled on 09/23/22 at 10:00 am . You should arrive  30 minutes prior to your appointment time for registration. Please follow the written instructions below on the day of your exam:  WARNING: IF YOU ARE ALLERGIC TO IODINE/X-RAY DYE, PLEASE NOTIFY RADIOLOGY IMMEDIATELY AT [phone]! YOU WILL BE GIVEN A 13 HOUR PREMEDICATION PREP.  1) Do not eat or drink anything after 6:00 am  (4 hours prior to your test)  You may take any medications as prescribed with a small amount of water, if necessary. If you take any of the following medications: METFORMIN, GLUCOPHAGE, GLUCOVANCE, AVANDAMET, RIOMET, FORTAMET, Elaine MET, JANUMET, GLUMETZA or METAGLIP, you MAY be asked to HOLD this medication 48 hours AFTER the exam.  The purpose of you drinking the oral contrast is to aid in the visualization of your intestinal tract. The contrast solution may cause some diarrhea. Depending on your individual set of symptoms, you may also receive an intravenous injection of x-ray contrast/dye. Plan on being at [facility] for 30 minutes or longer, depending on the type of exam you are having performed.  This test typically takes 30-45 minutes to complete.  If you have any questions regarding your exam or if you need to reschedule, you may call the CT department at [phone] between the hours of 8:00 am and 5:00 pm, Monday-Friday.  _______________________________________________________________________   Thank you for choosing me and [facility].  Ellouise Newer PA-C _

## 2022-09-17 NOTE — Progress Notes (Signed)
Noted  

## 2022-09-17 NOTE — Progress Notes (Signed)
Chief Complaint: Right sided abdominal pain and follow up chronic constipation  HPI:    Mr. Jacob Blanchard is a 66 year old male with a past medical history as listed below including Barrett's esophagus, chronic constipation, GERD, adenomatous polyps and multiple others, known to Dr. Henrene Pastor, who presents to clinic today with a complaint of right-sided abdominal pain and follow-up of his chronic constipation.    12/22/2018 EGD with Barrett's esophagus.  Repeat recommended in 5 years.    01/25/2020 colonoscopy with four 1-2 mm polyps in descending and ascending colon and diverticulosis.  Pathology showed adenomas and repeat recommended in 3 years.    06/11/2021 office visit with me for chronic constipation and medication refill.  At that time we discussed his history of a small bowel obstruction related to adhesions and ex lap with lysis of adhesions in September 2019 with repeat bowel obstruction.  At that time was having diarrhea with Linzess 290 mcg daily so he was tried on a lower dose.    Today, the patient presents to clinic and tells me that he tried a lower dose of Linzess but it did not work for him so he is still waking up around 2:00 in the morning to take his Linzess 290 mcg with 2 glasses of water then goes back to sleep and wakes up again around 5 or 6 to have loose stools.  He likes this regimen as it ensures that he is not becoming obstructed and he does not really have to worry about it.    Does have a new complaint today of some right-sided abdominal pain.  Tells me he has always had some discomfort there, especially if he got backed up or had any trouble with his bowels, but most recently about a month ago he was blowing leaves and lifted something above his head and felt some thing like "hot water" run across his abdomen and it inflamed his chronic agitation in this area.  Since then he has noticed it almost daily, somewhat worse with changing positions, has not really noticed a change with bowel  movements but he just wants to know if he needs to be concerned about it or not.    Denies fever, chills, weight loss or blood in his stool.  Past Medical History:  Diagnosis Date   Anxiety    prn xanax started 01/2019->self d/c'd in fall/winter 2020   Barrett's esophagus    Bx+ 2007, 2009, and 2020.   Chronic constipation    GI->Linzess trial 05/2019->helpful   Contact dermatitis and other eczema, due to unspecified cause    +scarring   Diverticulosis 2021   noted on CT abd 06/2020   Epididymal cyst 06/2018   Bilat: small on R, but 4 cm on left  Asymptomatic.   GERD (gastroesophageal reflux disease)    History of adenomatous polyp of colon 08/10/2006; 01/2020   2007 and 12/2018; 01/2020, recall 2024   Hyperlipidemia    Incisional hernia    Left foot pain    ? Arch strain.  Some numbness to suggest possible tarsal tunnel syndrome vs lumbar radiculitis (sports med MD).  Responded very well to custom orthotic.   Migraine syndrome    verapamil prophylaxis, cluster migraine   Pseudophakia of both eyes    Seasonal and perennial allergic rhinitis    vaccine/injection therapy helpful   Small bowel obstruction ([place]) Fall of 2019   surgery->lysis of adhesions   Vertebral compression fracture ([place]) 05/2019   T11 and T12 and L 1,  minimal trauma.  Dr. Darene Lamer put him on fosamax.    Past Surgical History:  Procedure Laterality Date   ABDOMINAL EXPLORATION SURGERY  Fall 2019   SBO->lysis of adhesions   COLONOSCOPY W/ POLYPECTOMY  08/10/06; 12/2018   Several adenomatous polyps 2007 and 2020; 01-25-2020. recall 2024   DEXA  05/24/2019   Dr. Bartholome Bill -2.4 + vert comp fx->fosamax started.   ESOPHAGOGASTRODUODENOSCOPY  2007 and 07/20/08;12/2018   GERD, BARRET's esoph (Dr. Henrene Pastor). Same 12/2018.   INCISIONAL HERNIA REPAIR N/A 02/08/2020   Procedure: LAPAROSCOPIC INCISIONAL HERNIA REPAIR WITH MESH;  Surgeon: Kinsinger, Arta Bruce, MD;  Location: [facility];  Service: General;  Laterality: N/A;    INTRAOCULAR LENS INSERTION  09/03/2020   OU   LE venous doppler u/s  04/19/15   L leg NORMAL   right knee arthroscopy  1980s   Torn meniscus (Dr. Noemi Chapel)    Current Outpatient Medications  Medication Sig Dispense Refill   alendronate (FOSAMAX) 70 MG tablet Take 1 tablet (70 mg total) by mouth every 7 (seven) days. Take with a full glass of water on an empty stomach. 12 tablet 3   atorvastatin (LIPITOR) 40 MG tablet TAKE 1 TABLET BY MOUTH EVERY DAY 90 tablet 3   Calcium Carbonate-Vitamin D3 (CALCIUM + VITAMIN D3) 600-400 MG-UNIT TABS Take 1 tablet by mouth 2 (two) times daily. 180 tablet 3   cyclobenzaprine (FLEXERIL) 10 MG tablet TAKE 1 TABLET BY MOUTH TWICE A DAY AS NEEDED 30 tablet 1   dicyclomine (BENTYL) 10 MG capsule TAKE 1 CAPSULE BY MOUTH TWICE A DAY 180 capsule 1   fluticasone (FLONASE) 50 MCG/ACT nasal spray SPRAY 2 SPRAYS INTO EACH NOSTRIL EVERY DAY 48 mL 1   ibuprofen (ADVIL) 200 MG tablet Take 4 tablets (800 mg total) by mouth every 8 (eight) hours as needed.     lansoprazole (PREVACID) 30 MG capsule Take 1 capsule (30 mg total) by mouth 2 (two) times daily. 180 capsule 3   LINZESS 290 MCG CAPS capsule TAKE 1 CAPSULE BY MOUTH DAILY BEFORE BREAKFAST. 90 capsule 3   polyethylene glycol powder (GLYCOLAX/MIRALAX) 17 GM/SCOOP powder Take by mouth.     pseudoephedrine (SUDAFED) 30 MG tablet Take by mouth.     verapamil (CALAN) 120 MG tablet Take 60 mg by mouth once. For migraines     No current facility-administered medications for this visit.    Allergies as of 09/17/2022 - Review Complete 09/17/2022  Allergen Reaction Noted   Oxycodone  01/31/2020    Family History  Problem Relation Age of Onset   Other Father        chemical PNA siphoning gas   Healthy Brother    Colon cancer Neg Hx    Esophageal cancer Neg Hx    Rectal cancer Neg Hx    Stomach cancer Neg Hx     Social History   Socioeconomic History   Marital status: Married    Spouse name: Not on file    Number of children: 1   Years of education: Not on file   Highest education level: Not on file  Occupational History   Occupation: Education officer, [employer]    Employer: [employer]  Tobacco Use   Smoking status: Former    Packs/day: 1.00    Years: 10.00    Total pack years: 10.00    Types: Cigarettes    Quit date: 07/06/1993    Years since quitting: 29.2   Smokeless tobacco: Never  Vaping Use   Vaping Use: Never used  Substance and Sexual Activity   Alcohol use: Yes    Comment: 1 or 2 daily   Drug use: No   Sexual activity: Not on file  Other Topics Concern   Not on file  Social History Narrative   Married, one daughter.   Educ: [employer]   Occup: Biochemist, clinical for [employer].    No tobacco, 2 beers a night, no hx of problem drinking/drug use.   Caffeine: 2-3 cups coffee every morning.  Tea x 2 later in day.   Social Determinants of Health   Financial Resource Strain: Not on file  Food Insecurity: No Food Insecurity (09/16/2022)   Hunger Vital Sign    Worried About Running Out of Food in the Last Year: Never true    Ran Out of Food in the Last Year: Never true  Transportation Needs: No Transportation Needs (09/16/2022)   PRAPARE - Hydrologist (Medical): No    Lack of Transportation (Non-Medical): No  Physical Activity: Not on file  Stress: Not on file  Social Connections: Not on file  Intimate Partner Violence: Not At Risk (05/25/2022)   Humiliation, Afraid, Rape, and Kick questionnaire    Fear of Current or Ex-Partner: No    Emotionally Abused: No    Physically Abused: No    Sexually Abused: No    Review of Systems:    Constitutional: No weight loss, fever or chills Cardiovascular: No chest pain Respiratory: No SOB Gastrointestinal: See HPI and otherwise negative   Physical Exam:  Vital signs: BP 130/84   Pulse 88   Ht '5\' 10"'$  (1.778 m)   Wt 155 lb 6 oz (70.5 kg)   BMI 22.29 kg/m   Constitutional:    Pleasant Caucasian male appears to be in NAD, Well developed, Well nourished, alert and cooperative Respiratory: Respirations even and unlabored. Lungs clear to auscultation bilaterally.   No wheezes, crackles, or rhonchi.  Cardiovascular: Normal S1, S2. No MRG. Regular rate and rhythm. No peripheral edema, cyanosis or pallor.  Gastrointestinal:  Soft, nondistended, mild-mod ttp over right abdomen, superior to umbilicus, no hernia appreciate, some degree of diastasis recti, No rebound or guarding. Normal bowel sounds. No appreciable masses or hepatomegaly. Rectal:  Not performed.  Psychiatric: Oriented to person, place and time. Demonstrates good judgement and reason without abnormal affect or behaviors.  RELEVANT LABS AND IMAGING: CBC    Component Value Date/Time   WBC 4.5 05/29/2022 0000   RBC 4.33 05/29/2022 0000   HGB 14.4 05/29/2022 0000   HCT 41.5 05/29/2022 0000   PLT 362 05/29/2022 0000   MCV 95.8 05/29/2022 0000   MCH 33.3 (H) 05/29/2022 0000   MCHC 34.7 05/29/2022 0000   RDW 11.7 05/29/2022 0000   LYMPHSABS 1.1 09/18/2021 0941   MONOABS 0.6 09/18/2021 0941   EOSABS 0.1 09/18/2021 0941   BASOSABS 0.0 09/18/2021 0941    CMP     Component Value Date/Time   NA 135 05/29/2022 0000   K 4.7 05/29/2022 0000   CL 100 05/29/2022 0000   CO2 27 05/29/2022 0000   GLUCOSE 83 05/29/2022 0000   BUN 10 05/29/2022 0000   CREATININE 0.85 05/29/2022 0000   CALCIUM 9.8 05/29/2022 0000   PROT 7.0 05/29/2022 0000   ALBUMIN 4.4 04/02/2022 0823   AST 23 05/29/2022 0000   ALT 26 05/29/2022 0000   ALKPHOS 73 04/02/2022 0823   BILITOT 0.7 05/29/2022 0000  GFRNONAA 77 05/30/2020 1543   GFRAA 89 05/30/2020 1543    Assessment: 1.  Right-sided abdominal pain: Acute on chronic, worsened after what sounds like a musculoskeletal event, previous mesh placement and lysis of adhesions; consider irritation of adhesions versus new hernia versus other 2.  Chronic constipation: History of small  bowel obstructions and lysis of adhesions, none since 2020, has liquid stools with Linzess 290 mcg daily but feels like he completely empties him and would like to continue this regimen 3.  History of adenomatous polyp: Last colonoscopy in 2021 with repeat recommended in 3 years, he will be due next year 4.  History of Barrett's esophagus: Last EGD in 2020 with repeat recommended in 5 years  Plan: 1.  Ordered to BMP to check kidney function 2.  Scheduled patient for CT of the abdomen pelvis with contrast for right-sided abdominal pain.  Patient wishes this to be scheduled in [place]. 3.  Discussed with patient that if CT is normal we can refer him to a sports medicine doctor to discuss this pain further if he wishes. 4.  Refilled Linzess 290 mcg daily #90 with 3 refills. 5.  Next colonoscopy due in April of next year, he is in recall, next EGD due in 2025 6.  Patient to follow in clinic per recommendations after time of imaging.  Ellouise Newer, PA-C [facility] 09/17/2022, 8:36 AM  Cc: McGowen, Adrian Blackwater, MD

## 2022-09-23 ENCOUNTER — Ambulatory Visit (INDEPENDENT_AMBULATORY_CARE_PROVIDER_SITE_OTHER): Payer: BC Managed Care – PPO

## 2022-09-23 DIAGNOSIS — K5909 Other constipation: Secondary | ICD-10-CM | POA: Diagnosis not present

## 2022-09-23 DIAGNOSIS — K5730 Diverticulosis of large intestine without perforation or abscess without bleeding: Secondary | ICD-10-CM | POA: Diagnosis not present

## 2022-09-23 DIAGNOSIS — K429 Umbilical hernia without obstruction or gangrene: Secondary | ICD-10-CM | POA: Diagnosis not present

## 2022-09-23 DIAGNOSIS — R109 Unspecified abdominal pain: Secondary | ICD-10-CM

## 2022-09-23 MED ORDER — IOHEXOL 300 MG/ML  SOLN
100.0000 mL | Freq: Once | INTRAMUSCULAR | Status: AC | PRN
  Administered 2022-09-23: 100 mL via INTRAVENOUS

## 2022-10-02 ENCOUNTER — Encounter: Payer: BC Managed Care – PPO | Admitting: Family Medicine

## 2022-10-05 ENCOUNTER — Other Ambulatory Visit: Payer: Self-pay | Admitting: Family Medicine

## 2022-10-15 DIAGNOSIS — M9901 Segmental and somatic dysfunction of cervical region: Secondary | ICD-10-CM | POA: Diagnosis not present

## 2022-10-15 DIAGNOSIS — M50323 Other cervical disc degeneration at C6-C7 level: Secondary | ICD-10-CM | POA: Diagnosis not present

## 2022-10-15 DIAGNOSIS — M50322 Other cervical disc degeneration at C5-C6 level: Secondary | ICD-10-CM | POA: Diagnosis not present

## 2022-10-15 DIAGNOSIS — M50321 Other cervical disc degeneration at C4-C5 level: Secondary | ICD-10-CM | POA: Diagnosis not present

## 2022-10-19 ENCOUNTER — Encounter: Payer: BC Managed Care – PPO | Admitting: Family Medicine

## 2022-10-30 ENCOUNTER — Other Ambulatory Visit: Payer: Self-pay | Admitting: Family Medicine

## 2022-11-02 ENCOUNTER — Telehealth: Payer: Self-pay | Admitting: Family Medicine

## 2022-11-02 NOTE — Telephone Encounter (Signed)
[place] for letter and RF prevacid

## 2022-11-02 NOTE — Telephone Encounter (Signed)
Pt is asking for 3 month supply of   lansoprazole (PREVACID) 30 MG capsule  Pharmacy is [facility]. He is also asking for a work note to excuse him from moving anything over 20 pounds due to injuries in the past regarding his back. He works for [employer].

## 2022-11-02 NOTE — Telephone Encounter (Signed)
Pt has hx of lumbar spondylosis. Upcoming appt on 2/8.  Please advise if both requests can be completed prior to appt

## 2022-11-03 ENCOUNTER — Other Ambulatory Visit: Payer: Self-pay | Admitting: Family Medicine

## 2022-11-03 NOTE — Telephone Encounter (Signed)
Work note completed and e-mailed to patient. Patient advised of refill and work note

## 2022-11-11 NOTE — Patient Instructions (Signed)
Health Maintenance, Male Adopting a healthy lifestyle and getting preventive care are important in promoting health and wellness. Ask your health care provider about: The right schedule for you to have regular tests and exams. Things you can do on your own to prevent diseases and keep yourself healthy. What should I know about diet, weight, and exercise? Eat a healthy diet  Eat a diet that includes plenty of vegetables, fruits, low-fat dairy products, and lean protein. Do not eat a lot of foods that are high in solid fats, added sugars, or sodium. Maintain a healthy weight Body mass index (BMI) is a measurement that can be used to identify possible weight problems. It estimates body fat based on height and weight. Your health care provider can help determine your BMI and help you achieve or maintain a healthy weight. Get regular exercise Get regular exercise. This is one of the most important things you can do for your health. Most adults should: Exercise for at least 150 minutes each week. The exercise should increase your heart rate and make you sweat (moderate-intensity exercise). Do strengthening exercises at least twice a week. This is in addition to the moderate-intensity exercise. Spend less time sitting. Even light physical activity can be beneficial. Watch cholesterol and blood lipids Have your blood tested for lipids and cholesterol at 69 years of age, then have this test every 5 years. You may need to have your cholesterol levels checked more often if: Your lipid or cholesterol levels are high. You are older than 69 years of age. You are at high risk for heart disease. What should I know about cancer screening? Many types of cancers can be detected early and may often be prevented. Depending on your health history and family history, you may need to have cancer screening at various ages. This may include screening for: Colorectal cancer. Prostate cancer. Skin cancer. Lung  cancer. What should I know about heart disease, diabetes, and high blood pressure? Blood pressure and heart disease High blood pressure causes heart disease and increases the risk of stroke. This is more likely to develop in people who have high blood pressure readings or are overweight. Talk with your health care provider about your target blood pressure readings. Have your blood pressure checked: Every 3-5 years if you are 18-39 years of age. Every year if you are 40 years old or older. If you are between the ages of 65 and 75 and are a current or former smoker, ask your health care provider if you should have a one-time screening for abdominal aortic aneurysm (AAA). Diabetes Have regular diabetes screenings. This checks your fasting blood sugar level. Have the screening done: Once every three years after age 45 if you are at a normal weight and have a low risk for diabetes. More often and at a younger age if you are overweight or have a high risk for diabetes. What should I know about preventing infection? Hepatitis B If you have a higher risk for hepatitis B, you should be screened for this virus. Talk with your health care provider to find out if you are at risk for hepatitis B infection. Hepatitis C Blood testing is recommended for: Everyone born from 1945 through 1965. Anyone with known risk factors for hepatitis C. Sexually transmitted infections (STIs) You should be screened each year for STIs, including gonorrhea and chlamydia, if: You are sexually active and are younger than 69 years of age. You are older than 69 years of age and your   health care provider tells you that you are at risk for this type of infection. Your sexual activity has changed since you were last screened, and you are at increased risk for chlamydia or gonorrhea. Ask your health care provider if you are at risk. Ask your health care provider about whether you are at high risk for HIV. Your health care provider  may recommend a prescription medicine to help prevent HIV infection. If you choose to take medicine to prevent HIV, you should first get tested for HIV. You should then be tested every 3 months for as long as you are taking the medicine. Follow these instructions at home: Alcohol use Do not drink alcohol if your health care provider tells you not to drink. If you drink alcohol: Limit how much you have to 0-2 drinks a day. Know how much alcohol is in your drink. In the [place] one drink equals one 12 oz bottle of beer (355 mL), one 5 oz glass of wine (148 mL), or one 1 oz glass of hard liquor (44 mL). Lifestyle Do not use any products that contain nicotine or tobacco. These products include cigarettes, chewing tobacco, and vaping devices, such as e-cigarettes. If you need help quitting, ask your health care provider. Do not use street drugs. Do not share needles. Ask your health care provider for help if you need support or information about quitting drugs. General instructions Schedule regular health, dental, and eye exams. Stay current with your vaccines. Tell your health care provider if: You often feel depressed. You have ever been abused or do not feel safe at home. Summary Adopting a healthy lifestyle and getting preventive care are important in promoting health and wellness. Follow your health care provider's instructions about healthy diet, exercising, and getting tested or screened for diseases. Follow your health care provider's instructions on monitoring your cholesterol and blood pressure. This information is not intended to replace advice given to you by your health care provider. Make sure you discuss any questions you have with your health care provider. Document Revised: 02/10/2021 Document Reviewed: 02/10/2021 Elsevier Patient Education  2023 [employer]  

## 2022-11-12 ENCOUNTER — Ambulatory Visit (INDEPENDENT_AMBULATORY_CARE_PROVIDER_SITE_OTHER): Payer: BC Managed Care – PPO | Admitting: Family Medicine

## 2022-11-12 ENCOUNTER — Encounter: Payer: Self-pay | Admitting: Family Medicine

## 2022-11-12 VITALS — BP 161/104 | HR 86 | Temp 98.1°F | Ht 70.0 in | Wt 153.4 lb

## 2022-11-12 DIAGNOSIS — Z0000 Encounter for general adult medical examination without abnormal findings: Secondary | ICD-10-CM | POA: Diagnosis not present

## 2022-11-12 DIAGNOSIS — R030 Elevated blood-pressure reading, without diagnosis of hypertension: Secondary | ICD-10-CM | POA: Diagnosis not present

## 2022-11-12 DIAGNOSIS — R21 Rash and other nonspecific skin eruption: Secondary | ICD-10-CM

## 2022-11-12 DIAGNOSIS — E7800 Pure hypercholesterolemia, unspecified: Secondary | ICD-10-CM

## 2022-11-12 DIAGNOSIS — Z125 Encounter for screening for malignant neoplasm of prostate: Secondary | ICD-10-CM | POA: Diagnosis not present

## 2022-11-12 LAB — CBC
HCT: 43.7 % (ref 39.0–52.0)
Hemoglobin: 14.7 g/dL (ref 13.0–17.0)
MCHC: 33.7 g/dL (ref 30.0–36.0)
MCV: 98.3 fl (ref 78.0–100.0)
Platelets: 391 10*3/uL (ref 150.0–400.0)
RBC: 4.45 Mil/uL (ref 4.22–5.81)
RDW: 13 % (ref 11.5–15.5)
WBC: 6.4 10*3/uL (ref 4.0–10.5)

## 2022-11-12 LAB — COMPREHENSIVE METABOLIC PANEL
ALT: 25 U/L (ref 0–53)
AST: 23 U/L (ref 0–37)
Albumin: 4.6 g/dL (ref 3.5–5.2)
Alkaline Phosphatase: 66 U/L (ref 39–117)
BUN: 7 mg/dL (ref 6–23)
CO2: 25 mEq/L (ref 19–32)
Calcium: 9.7 mg/dL (ref 8.4–10.5)
Chloride: 101 mEq/L (ref 96–112)
Creatinine, Ser: 0.89 mg/dL (ref 0.40–1.50)
GFR: 88.26 mL/min (ref >60.00)
Glucose, Bld: 88 mg/dL (ref 70–99)
Potassium: 4.6 mEq/L (ref 3.5–5.1)
Sodium: 137 mEq/L (ref 135–145)
Total Bilirubin: 1 mg/dL (ref 0.2–1.2)
Total Protein: 7.2 g/dL (ref 6.0–8.3)

## 2022-11-12 LAB — LIPID PANEL
Cholesterol: 173 mg/dL (ref 0–200)
HDL: 81.5 mg/dL (ref >39.00)
LDL Cholesterol: 79 mg/dL (ref 0–99)
NonHDL: 91.09
Total CHOL/HDL Ratio: 2
Triglycerides: 62 mg/dL (ref 0.0–149.0)
VLDL: 12.4 mg/dL (ref 0.0–40.0)

## 2022-11-12 LAB — PSA, MEDICARE: PSA: 0.15 ng/ml (ref 0.10–4.00)

## 2022-11-12 MED ORDER — LANSOPRAZOLE 30 MG PO CPDR: 30.0000 mg | DELAYED_RELEASE_CAPSULE | Freq: Two times a day (BID) | ORAL | 3 refills | Status: DC

## 2022-11-12 MED ORDER — METHYLPREDNISOLONE ACETATE 80 MG/ML IJ SUSP
80.0000 mg | Freq: Once | INTRAMUSCULAR | Status: AC
  Administered 2022-11-12: 80 mg via INTRAMUSCULAR

## 2022-11-12 MED ORDER — PREDNISONE 10 MG PO TABS: ORAL_TABLET | ORAL | 0 refills | Status: DC

## 2022-11-12 NOTE — Progress Notes (Signed)
Office Note 11/12/2022  CC:  Chief Complaint  Patient presents with   Annual Exam    Pt is fasting   HPI:  Patient is a 67 y.o. male who is here for annual health maintenance exam and follow-up hyperlipidemia and whitecoat hypertension. Doing well other than having a rash for the last 1 week. Very itchy rash on the back of the neck and all over his hands.  He does have a history of seborrheic dermatitis and does feel like his scalp is broken out lately. Ketoconazole shampoo no help. No known recent contact irritants or allergens.  No new medications recently. Recalls a similar episode of this rash in the past and went to urgent care and an injection of steroid helped.  Sounds like it returned shortly after and he was given a course of prednisone and it resolved. He has not felt ill recently: No URI or gastroenteritis, no fever, no diarrhea.  Blood pressures continue to be normal when he checks it at home.  Past Medical History:  Diagnosis Date   Anxiety    prn xanax started 01/2019->self d/c'd in fall/winter 2020   Barrett's esophagus    Bx+ 2007, 2009, and 2020.   Chronic constipation    GI->Linzess trial 05/2019->helpful   Contact dermatitis and other eczema, due to unspecified cause    +scarring   Diverticulosis 2021   noted on CT abd 06/2020   Epididymal cyst 06/2018   Bilat: small on R, but 4 cm on left  Asymptomatic.   GERD (gastroesophageal reflux disease)    History of adenomatous polyp of colon 08/10/2006; 01/2020   2007 and 12/2018; 01/2020, recall 2024   Hyperlipidemia    Incisional hernia    Left foot pain    ? Arch strain.  Some numbness to suggest possible tarsal tunnel syndrome vs lumbar radiculitis (sports med MD).  Responded very well to custom orthotic.   Migraine syndrome    verapamil prophylaxis, cluster migraine   Pseudophakia of both eyes    Seasonal and perennial allergic rhinitis    vaccine/injection therapy helpful   Small bowel obstruction ([place])  Fall of 2019   surgery->lysis of adhesions   Vertebral compression fracture ([place]) 05/2019   T11 and T12 and L 1, minimal trauma.  Dr. Darene Lamer put him on fosamax.    Past Surgical History:  Procedure Laterality Date   ABDOMINAL EXPLORATION SURGERY  Fall 2019   SBO->lysis of adhesions   COLONOSCOPY W/ POLYPECTOMY  08/10/06; 12/2018   Several adenomatous polyps 2007 and 2020; 01-25-2020. recall 2024   DEXA  05/24/2019   Dr. Bartholome Bill -2.4 + vert comp fx->fosamax started.  06/2021 T score -1.8   ESOPHAGOGASTRODUODENOSCOPY  2007 and 07/20/08;12/2018   GERD, BARRET's esoph (Dr. Henrene Pastor). Same 12/2018.   INCISIONAL HERNIA REPAIR N/A 02/08/2020   Procedure: LAPAROSCOPIC INCISIONAL HERNIA REPAIR WITH MESH;  Surgeon: Kinsinger, Arta Bruce, MD;  Location: [facility];  Service: General;  Laterality: N/A;   INTRAOCULAR LENS INSERTION  09/03/2020   OU   LE venous doppler u/s  04/19/2015   L leg NORMAL   right knee arthroscopy  1980s   Torn meniscus (Dr. Noemi Chapel)    Family History  Problem Relation Age of Onset   Other Father        chemical PNA siphoning gas   Healthy Brother    Colon cancer Neg Hx    Esophageal cancer Neg Hx    Rectal cancer Neg Hx    Stomach  cancer Neg Hx     Social History   Socioeconomic History   Marital status: Married    Spouse name: Not on file   Number of children: 1   Years of education: Not on file   Highest education level: Not on file  Occupational History   Occupation: Education officer, museum product-sells    Employer: [employer]  Tobacco Use   Smoking status: Former    Packs/day: 1.00    Years: 10.00    Total pack years: 10.00    Types: Cigarettes    Quit date: 07/06/1993    Years since quitting: 29.3   Smokeless tobacco: Never  Vaping Use   Vaping Use: Never used  Substance and Sexual Activity   Alcohol use: Yes    Comment: 1 or 2 daily   Drug use: No   Sexual activity: Not on file  Other Topics Concern   Not on file   Social History Narrative   Married, one daughter.   Educ: [employer]   Occup: Biochemist, clinical for [employer].    No tobacco, 2 beers a night, no hx of problem drinking/drug use.   Caffeine: 2-3 cups coffee every morning.  Tea x 2 later in day.   Social Determinants of Health   Financial Resource Strain: Not on file  Food Insecurity: No Food Insecurity (09/16/2022)   Hunger Vital Sign    Worried About Running Out of Food in the Last Year: Never true    Ran Out of Food in the Last Year: Never true  Transportation Needs: No Transportation Needs (09/16/2022)   PRAPARE - Hydrologist (Medical): No    Lack of Transportation (Non-Medical): No  Physical Activity: Not on file  Stress: Not on file  Social Connections: Not on file  Intimate Partner Violence: Not At Risk (05/25/2022)   Humiliation, Afraid, Rape, and Kick questionnaire    Fear of Current or Ex-Partner: No    Emotionally Abused: No    Physically Abused: No    Sexually Abused: No    Outpatient Medications Prior to Visit  Medication Sig Dispense Refill   alendronate (FOSAMAX) 70 MG tablet Take 1 tablet (70 mg total) by mouth every 7 (seven) days. Take with a full glass of water on an empty stomach. 12 tablet 3   atorvastatin (LIPITOR) 40 MG tablet TAKE 1 TABLET BY MOUTH EVERY DAY 90 tablet 3   Calcium Carbonate-Vitamin D3 (CALCIUM + VITAMIN D3) 600-400 MG-UNIT TABS Take 1 tablet by mouth 2 (two) times daily. 180 tablet 3   cyclobenzaprine (FLEXERIL) 10 MG tablet TAKE 1 TABLET BY MOUTH TWICE A DAY AS NEEDED 30 tablet 1   dicyclomine (BENTYL) 10 MG capsule TAKE 1 CAPSULE BY MOUTH TWICE A DAY 180 capsule 1   fluticasone (FLONASE) 50 MCG/ACT nasal spray SPRAY 2 SPRAYS INTO EACH NOSTRIL EVERY DAY 48 mL 0   ibuprofen (ADVIL) 200 MG tablet Take 4 tablets (800 mg total) by mouth every 8 (eight) hours as needed.     linaclotide (LINZESS) 290 MCG CAPS capsule TAKE 1 CAPSULE BY MOUTH DAILY BEFORE BREAKFAST. 90  capsule 3   polyethylene glycol powder (GLYCOLAX/MIRALAX) 17 GM/SCOOP powder Take by mouth.     pseudoephedrine (SUDAFED) 30 MG tablet Take by mouth.     verapamil (CALAN) 120 MG tablet Take 60 mg by mouth once. For migraines     lansoprazole (PREVACID) 30 MG capsule TAKE 1 CAPSULE BY MOUTH TWICE A DAY  60 capsule 0   No facility-administered medications prior to visit.    Allergies  Allergen Reactions   Oxycodone     nausea    Review of Systems  Constitutional:  Negative for appetite change, chills, fatigue and fever.  HENT:  Negative for congestion, dental problem, ear pain and sore throat.   Eyes:  Negative for discharge, redness and visual disturbance.  Respiratory:  Negative for cough, chest tightness, shortness of breath and wheezing.   Cardiovascular:  Negative for chest pain, palpitations and leg swelling.  Gastrointestinal:  Negative for abdominal pain, blood in stool, diarrhea, nausea and vomiting.  Genitourinary:  Negative for difficulty urinating, dysuria, flank pain, frequency, hematuria and urgency.  Musculoskeletal:  Negative for arthralgias, back pain, joint swelling, myalgias and neck stiffness.  Skin:  Positive for rash. Negative for pallor.  Neurological:  Negative for dizziness, speech difficulty, weakness and headaches.  Hematological:  Negative for adenopathy. Does not bruise/bleed easily.  Psychiatric/Behavioral:  Negative for confusion and sleep disturbance. The patient is not nervous/anxious.     PE;    11/12/2022    8:34 AM 09/17/2022    8:32 AM 08/21/2022    8:09 AM  Vitals with BMI  Height '5\' 10"'$  '5\' 10"'$  '5\' 10"'$   Weight 153 lbs 6 oz 155 lbs 6 oz 155 lbs 13 oz  BMI 22.01 36.64 40.34  Systolic 742 595 638  Diastolic 756 84 79  Pulse 86 88 79    Gen: Alert, well appearing.  Patient is oriented to person, place, time, and situation. AFFECT: pleasant, lucid thought and speech. ENT: Ears: EACs clear, normal epithelium.  TMs with good light reflex and  landmarks bilaterally.  Eyes: no injection, icteris, swelling, or exudate.  EOMI, PERRLA. Nose: no drainage or turbinate edema/swelling.  No injection or focal lesion.  Mouth: lips without lesion/swelling.  Oral mucosa pink and moist.  Dentition intact and without obvious caries or gingival swelling.  Oropharynx without erythema, exudate, or swelling.  Neck: supple/nontender.  No LAD, mass, or TM.  Carotid pulses 2+ bilaterally, without bruits. CV: RRR, no m/r/g.   LUNGS: CTA bilat, nonlabored resps, good aeration in all lung fields. ABD: soft, NT, ND, BS normal.  No hepatospenomegaly or mass.  No bruits. EXT: no clubbing, cyanosis, or edema.  Musculoskeletal: no joint swelling, erythema, warmth, or tenderness.  ROM of all joints intact. Skin -erythematous rash covering his hands--both the palmar and dorsal surfaces.  This extends up his wrist a little bit.  There is no desquamation, vesicles, papules, or pustules.  No hives. Similar rash although a little more sparse on the back of his neck.  This is a little bit superficially flaky and this rash extends up into his scalp.  Pertinent labs:  Lab Results  Component Value Date   TSH 1.94 06/23/2018   Lab Results  Component Value Date   WBC 4.5 05/29/2022   HGB 14.4 05/29/2022   HCT 41.5 05/29/2022   MCV 95.8 05/29/2022   PLT 362 05/29/2022   Lab Results  Component Value Date   CREATININE 0.85 09/17/2022   BUN 6 09/17/2022   NA 133 (L) 09/17/2022   K 4.4 09/17/2022   CL 98 09/17/2022   CO2 29 09/17/2022   Lab Results  Component Value Date   ALT 26 05/29/2022   AST 23 05/29/2022   ALKPHOS 73 04/02/2022   BILITOT 0.7 05/29/2022   Lab Results  Component Value Date   CHOL 159 04/02/2022   Lab  Results  Component Value Date   HDL 74.60 04/02/2022   Lab Results  Component Value Date   LDLCALC 75 04/02/2022   Lab Results  Component Value Date   TRIG 50.0 04/02/2022   Lab Results  Component Value Date   CHOLHDL 2  04/02/2022   Lab Results  Component Value Date   PSA 0.20 09/18/2021   PSA 0.16 07/12/2020   PSA 0.22 06/29/2019    ASSESSMENT AND PLAN:   #1 health maintenance exam: Reviewed age and gender appropriate health maintenance issues (prudent diet, regular exercise, health risks of tobacco and excessive alcohol, use of seatbelts, fire alarms in home, use of sunscreen).  Also reviewed age and gender appropriate health screening as well as vaccine recommendations. Vaccines: All up-to-date Labs: cbc, cmet, flp, psa. Prostate ca screening: PSA today. Colon ca screening: recall 01/2023.  #2 pruritic rash. Unknown etiology. Depo-Medrol 80 mg IM today. Tomorrow start prednisone 30 mg a day x 3 days, then 20 mg a day x 3 days, then 10 mg a day x 3 days.  3.  Whitecoat hypertension. Continue to monitor blood pressure at home.  #4 hypercholesterolemia, doing well on atorvastatin 40 mg a day. Lipid panel and hepatic panel today.  An After Visit Summary was printed and given to the patient.  FOLLOW UP:  Return in about 6 months (around 05/13/2023) for routine chronic illness f/u.  Signed:  Crissie Sickles, MD           11/12/2022

## 2022-11-12 NOTE — Addendum Note (Signed)
Addended by: Deveron Furlong D on: 11/12/2022 09:24 AM   Modules accepted: Orders

## 2022-11-19 DIAGNOSIS — M9901 Segmental and somatic dysfunction of cervical region: Secondary | ICD-10-CM | POA: Diagnosis not present

## 2022-11-19 DIAGNOSIS — M50323 Other cervical disc degeneration at C6-C7 level: Secondary | ICD-10-CM | POA: Diagnosis not present

## 2022-11-19 DIAGNOSIS — M50322 Other cervical disc degeneration at C5-C6 level: Secondary | ICD-10-CM | POA: Diagnosis not present

## 2022-11-19 DIAGNOSIS — M50321 Other cervical disc degeneration at C4-C5 level: Secondary | ICD-10-CM | POA: Diagnosis not present

## 2022-12-07 DIAGNOSIS — G44009 Cluster headache syndrome, unspecified, not intractable: Secondary | ICD-10-CM | POA: Diagnosis not present

## 2022-12-24 ENCOUNTER — Ambulatory Visit
Admission: EM | Admit: 2022-12-24 | Discharge: 2022-12-24 | Disposition: A | Discharge status: Home / Self Care | Payer: BC Managed Care – PPO | Attending: Family Medicine | Admitting: Family Medicine

## 2022-12-24 DIAGNOSIS — J209 Acute bronchitis, unspecified: Secondary | ICD-10-CM

## 2022-12-24 MED ORDER — METHYLPREDNISOLONE SODIUM SUCC 40 MG IJ SOLR: 40.0000 mg | Freq: Once | INTRAMUSCULAR | Status: DC

## 2022-12-24 MED ORDER — PREDNISONE 10 MG PO TABS: ORAL_TABLET | ORAL | 0 refills | Status: DC

## 2022-12-24 NOTE — ED Provider Notes (Addendum)
[facility]    CSN: [id number] Arrival date & time: 12/24/22  1221      History   Chief Complaint Chief Complaint  Patient presents with   Cough    HPI Jacob Blanchard is a 67 y.o. male.   HPI  Patient states she has had congestion and coughing for 4 to 5 days.  He has not taken his temperature.  He has not had sweats or chills.  He is feeling tired.  He states he has chest pain with coughing.  No shortness of breath.  No underlying lung disease to have a diagnosis of chronic asthma in the chart.  Non-smoker at this time, quit many years ago He is doing a lot of coughing.  Coughing up a lot of thick sputum.  Shortness of breath or inhaler use  Past Medical History:  Diagnosis Date   Anxiety    prn xanax started 01/2019->self d/c'd in fall/winter 2020   Barrett's esophagus    Bx+ 2007, 2009, and 2020.   Chronic constipation    GI->Linzess trial 05/2019->helpful   Contact dermatitis and other eczema, due to unspecified cause    +scarring   Diverticulosis 2021   noted on CT abd 06/2020   Epididymal cyst 06/2018   Bilat: small on R, but 4 cm on left  Asymptomatic.   GERD (gastroesophageal reflux disease)    History of adenomatous polyp of colon 08/10/2006; 01/2020   2007 and 12/2018; 01/2020, recall 2024   Hyperlipidemia    Incisional hernia    Left foot pain    ? Arch strain.  Some numbness to suggest possible tarsal tunnel syndrome vs lumbar radiculitis (sports med MD).  Responded very well to custom orthotic.   Migraine syndrome    verapamil prophylaxis, cluster migraine   Pseudophakia of both eyes    Seasonal and perennial allergic rhinitis    vaccine/injection therapy helpful   Small bowel obstruction ([place]) Fall of 2019   surgery->lysis of adhesions   Vertebral compression fracture ([place]) 05/2019   T11 and T12 and L 1, minimal trauma.  Dr. Darene Lamer put him on fosamax.    Patient Active Problem List   Diagnosis Date Noted   Dupuytren's contracture of both  hands 03/21/2021   Presbyopia 09/03/2020   Generalized abdominal pain 05/29/2020   History of colonic polyps [id number]   Umbilical hernia without obstruction and without gangrene 12/14/2019   Osteoporosis 05/24/2019   Lumbar spondylosis 05/12/2019   Thoracic compression fracture ([place]) 05/12/2019   Chronic constipation 05/12/2019   Partial bowel obstruction ([place]) 02/19/2019   Left foot pain 01/21/2017   Maxillary sinusitis 07/19/2015   Chronic asthma 10/03/2012   Seasonal and perennial allergic rhinitis 06/05/2008   GERD 06/05/2008   Eczema 06/05/2008   Fatigue 02/21/2008    Past Surgical History:  Procedure Laterality Date   ABDOMINAL EXPLORATION SURGERY  Fall 2019   SBO->lysis of adhesions   COLONOSCOPY W/ POLYPECTOMY  08/10/06; 12/2018   Several adenomatous polyps 2007 and 2020; 01-25-2020. recall 2024   DEXA  05/24/2019   Dr. Bartholome Bill -2.4 + vert comp fx->fosamax started.  06/2021 T score -1.8   ESOPHAGOGASTRODUODENOSCOPY  2007 and 07/20/08;12/2018   GERD, BARRET's esoph (Dr. Henrene Pastor). Same 12/2018.   INCISIONAL HERNIA REPAIR N/A 02/08/2020   Procedure: LAPAROSCOPIC INCISIONAL HERNIA REPAIR WITH MESH;  Surgeon: Kinsinger, Arta Bruce, MD;  Location: [facility];  Service: General;  Laterality: N/A;   INTRAOCULAR LENS INSERTION  09/03/2020   OU  LE venous doppler u/s  04/19/2015   L leg NORMAL   right knee arthroscopy  1980s   Torn meniscus (Dr. Noemi Chapel)       Home Medications    Prior to Admission medications   Medication Sig Start Date End Date Taking? Authorizing Provider  guaiFENesin (MUCINEX) 600 MG 12 hr tablet Take by mouth 2 (two) times daily.   Yes [provider]  alendronate (FOSAMAX) 70 MG tablet Take 1 tablet (70 mg total) by mouth every 7 (seven) days. Take with a full glass of water on an empty stomach. 05/29/22   Silverio Decamp, MD  atorvastatin (LIPITOR) 40 MG tablet TAKE 1 TABLET BY MOUTH EVERY DAY 03/25/22   McGowen, Adrian Blackwater, MD  Calcium Carbonate-Vitamin D3 (CALCIUM + VITAMIN D3) 600-400 MG-UNIT TABS Take 1 tablet by mouth 2 (two) times daily. 03/21/21   Silverio Decamp, MD  cyclobenzaprine (FLEXERIL) 10 MG tablet TAKE 1 TABLET BY MOUTH TWICE A DAY AS NEEDED 05/12/22   McGowen, Adrian Blackwater, MD  dicyclomine (BENTYL) 10 MG capsule TAKE 1 CAPSULE BY MOUTH TWICE A DAY 05/04/22   Zehr, Janett Billow D, PA-C  fluticasone (FLONASE) 50 MCG/ACT nasal spray SPRAY 2 SPRAYS INTO EACH NOSTRIL EVERY DAY 10/06/22   McGowen, Adrian Blackwater, MD  ibuprofen (ADVIL) 200 MG tablet Take 4 tablets (800 mg total) by mouth every 8 (eight) hours as needed. 07/09/22   Silverio Decamp, MD  lansoprazole (PREVACID) 30 MG capsule Take 1 capsule (30 mg total) by mouth 2 (two) times daily. 11/12/22   McGowen, Adrian Blackwater, MD  linaclotide (LINZESS) 290 MCG CAPS capsule TAKE 1 CAPSULE BY MOUTH DAILY BEFORE BREAKFAST. 09/17/22   Levin Erp, PA  polyethylene glycol powder [facility]) 17 GM/SCOOP powder Take by mouth.    [provider]  predniSONE (DELTASONE) 10 MG tablet 3 tabs po qd x 3d, then 2 tabs po qd x 3d, then 1 tab po qd x 3d 12/24/22   Raylene Everts, MD  verapamil (CALAN) 120 MG tablet Take 60 mg by mouth once. For migraines 06/19/19   [provider]    Family History Family History  Problem Relation Age of Onset   Other Father        chemical PNA siphoning gas   Healthy Brother    Colon cancer Neg Hx    Esophageal cancer Neg Hx    Rectal cancer Neg Hx    Stomach cancer Neg Hx     Social History Social History   Tobacco Use   Smoking status: Former    Packs/day: 1.00    Years: 10.00    Additional pack years: 0.00    Total pack years: 10.00    Types: Cigarettes    Quit date: 07/06/1993    Years since quitting: 29.4   Smokeless tobacco: Never  Vaping Use   Vaping Use: Never used  Substance Use Topics   Alcohol use: Yes    Comment: 1 or 2 daily   Drug use: No     Allergies    Oxycodone   Review of Systems Review of Systems See HPI  Physical Exam Triage Vital Signs ED Triage Vitals  Enc Vitals Group     BP 12/24/22 1244 131/86     Pulse Rate 12/24/22 1244 84     Resp 12/24/22 1244 20     Temp 12/24/22 1244 (!) 97.4 F (36.3 C)     Temp Source 12/24/22 1244 Oral  SpO2 12/24/22 1244 99 %     Weight 12/24/22 1242 155 lb (70.3 kg)     Height 12/24/22 1242 5\' 10"  (1.778 m)     Head Circumference --      Peak Flow --      Pain Score 12/24/22 1241 0     Pain Loc --      Pain Edu? --      Excl. in [place]? --    No data found.  Updated Vital Signs BP 131/86 (BP Location: Right Arm)   Pulse 84   Temp (!) 97.4 F (36.3 C) (Oral)   Resp 20   Ht 5\' 10"  (1.778 m)   Wt 70.3 kg   SpO2 99%   BMI 22.24 kg/m      Physical Exam Constitutional:      General: He is not in acute distress.    Appearance: He is well-developed and normal weight.  HENT:     Head: Normocephalic and atraumatic.     Right Ear: Tympanic membrane and ear canal normal.     Left Ear: Tympanic membrane and ear canal normal.     Nose: No congestion or rhinorrhea.     Mouth/Throat:     Pharynx: No posterior oropharyngeal erythema.  Eyes:     Conjunctiva/sclera: Conjunctivae normal.     Pupils: Pupils are equal, round, and reactive to light.  Cardiovascular:     Rate and Rhythm: Normal rate and regular rhythm.     Heart sounds: Normal heart sounds.  Pulmonary:     Effort: Pulmonary effort is normal. No respiratory distress.     Breath sounds: Rhonchi present.     Comments: Few scattered rhonchi and wheeze.  No rales Abdominal:     General: There is no distension.     Palpations: Abdomen is soft.  Musculoskeletal:        General: Normal range of motion.     Cervical back: Normal range of motion and neck supple.  Skin:    General: Skin is warm and dry.  Neurological:     Mental Status: He is alert.      UC Treatments / Results  Labs (all labs ordered are listed, but  only abnormal results are displayed) Labs Reviewed - No data to display  EKG   Radiology No results found.  Procedures Procedures (including critical care time)  Medications Ordered in UC Medications  methylPREDNISolone sodium succinate (SOLU-MEDROL) 40 mg/mL injection 40 mg (has no administration in time range)    Initial Impression / Assessment and Plan / UC Course  I have reviewed the triage vital signs and the nursing notes.  Pertinent labs & imaging results that were available during my care of the patient were reviewed by me and considered in my medical decision making (see chart for details).  Initially patient requested a shot of steroid.  He states this works better for him.  I ordered Solu-Medrol, but then he left with his shot was administered  Final Clinical Impressions(s) / UC Diagnoses   Final diagnoses:  Acute bronchitis, unspecified organism     Discharge Instructions      Start the prednisone tomorrow.  Take this prednisone as scheduled Drink lots of fluids May continue your Mucinex See your doctor return here if not improving by next week      ED Prescriptions     Medication Sig Dispense Auth. Provider   predniSONE (DELTASONE) 10 MG tablet 3 tabs po qd x 3d, then  2 tabs po qd x 3d, then 1 tab po qd x 3d 18 tablet Raylene Everts, MD      PDMP not reviewed this encounter.   Raylene Everts, MD 12/24/22 1313    Raylene Everts, MD 12/24/22 [id number]

## 2022-12-24 NOTE — Discharge Instructions (Signed)
Start the prednisone tomorrow.  Take this prednisone as scheduled Drink lots of fluids May continue your Mucinex See your doctor return here if not improving by next week

## 2022-12-24 NOTE — ED Triage Notes (Signed)
Pt presents to Urgent Care with c/o cough x 4-5 days and nasal congestion x 2-3 days. Has not monitored temperature; no COVID test done.

## 2022-12-29 DIAGNOSIS — M50322 Other cervical disc degeneration at C5-C6 level: Secondary | ICD-10-CM | POA: Diagnosis not present

## 2022-12-29 DIAGNOSIS — M50323 Other cervical disc degeneration at C6-C7 level: Secondary | ICD-10-CM | POA: Diagnosis not present

## 2022-12-29 DIAGNOSIS — M50321 Other cervical disc degeneration at C4-C5 level: Secondary | ICD-10-CM | POA: Diagnosis not present

## 2022-12-29 DIAGNOSIS — M9901 Segmental and somatic dysfunction of cervical region: Secondary | ICD-10-CM | POA: Diagnosis not present

## 2023-01-02 ENCOUNTER — Other Ambulatory Visit: Payer: Self-pay | Admitting: Family Medicine

## 2023-01-06 ENCOUNTER — Telehealth: Payer: Self-pay | Admitting: Family Medicine

## 2023-01-06 ENCOUNTER — Other Ambulatory Visit: Payer: Self-pay

## 2023-01-06 ENCOUNTER — Other Ambulatory Visit: Payer: Self-pay | Admitting: Gastroenterology

## 2023-01-06 MED ORDER — ATORVASTATIN CALCIUM 40 MG PO TABS: 40.0000 mg | ORAL_TABLET | Freq: Every day | ORAL | 1 refills | Status: DC

## 2023-01-06 NOTE — Telephone Encounter (Signed)
Refill sent.

## 2023-01-06 NOTE — Telephone Encounter (Signed)
Pt needs refill on atorvastatin (LIPITOR) 40 MG tablet   Pharmacy is correct and confirmed.

## 2023-01-21 DIAGNOSIS — L309 Dermatitis, unspecified: Secondary | ICD-10-CM | POA: Diagnosis not present

## 2023-01-21 DIAGNOSIS — L570 Actinic keratosis: Secondary | ICD-10-CM | POA: Diagnosis not present

## 2023-02-03 ENCOUNTER — Encounter: Payer: Self-pay | Admitting: Internal Medicine

## 2023-02-11 DIAGNOSIS — M50321 Other cervical disc degeneration at C4-C5 level: Secondary | ICD-10-CM | POA: Diagnosis not present

## 2023-02-11 DIAGNOSIS — M50322 Other cervical disc degeneration at C5-C6 level: Secondary | ICD-10-CM | POA: Diagnosis not present

## 2023-02-11 DIAGNOSIS — M50323 Other cervical disc degeneration at C6-C7 level: Secondary | ICD-10-CM | POA: Diagnosis not present

## 2023-02-11 DIAGNOSIS — M9901 Segmental and somatic dysfunction of cervical region: Secondary | ICD-10-CM | POA: Diagnosis not present

## 2023-02-23 ENCOUNTER — Other Ambulatory Visit: Payer: Self-pay | Admitting: Family Medicine

## 2023-03-11 DIAGNOSIS — M50321 Other cervical disc degeneration at C4-C5 level: Secondary | ICD-10-CM | POA: Diagnosis not present

## 2023-03-11 DIAGNOSIS — M9901 Segmental and somatic dysfunction of cervical region: Secondary | ICD-10-CM | POA: Diagnosis not present

## 2023-03-11 DIAGNOSIS — M50322 Other cervical disc degeneration at C5-C6 level: Secondary | ICD-10-CM | POA: Diagnosis not present

## 2023-03-11 DIAGNOSIS — M50323 Other cervical disc degeneration at C6-C7 level: Secondary | ICD-10-CM | POA: Diagnosis not present

## 2023-04-07 ENCOUNTER — Ambulatory Visit (INDEPENDENT_AMBULATORY_CARE_PROVIDER_SITE_OTHER): Payer: BC Managed Care – PPO

## 2023-04-07 ENCOUNTER — Ambulatory Visit
Admission: RE | Admit: 2023-04-07 | Discharge: 2023-04-07 | Disposition: A | Discharge status: Home / Self Care | Payer: BC Managed Care – PPO | Source: Ambulatory Visit | Attending: Family Medicine | Admitting: Family Medicine

## 2023-04-07 VITALS — BP 120/83 | HR 76 | Temp 97.5°F | Resp 20

## 2023-04-07 DIAGNOSIS — M79601 Pain in right arm: Secondary | ICD-10-CM | POA: Diagnosis not present

## 2023-04-07 DIAGNOSIS — M25531 Pain in right wrist: Secondary | ICD-10-CM

## 2023-04-07 DIAGNOSIS — S66911A Strain of unspecified muscle, fascia and tendon at wrist and hand level, right hand, initial encounter: Secondary | ICD-10-CM

## 2023-04-07 MED ORDER — PREDNISONE 10 MG (21) PO TBPK: ORAL_TABLET | Freq: Every day | ORAL | 0 refills | Status: DC

## 2023-04-07 NOTE — ED Triage Notes (Signed)
Pt presents to uc with co of right wrist pain since fathers day. Pt reports he was shoveling dirt and had a sharp pain start radiating down his arm. Mild swelling  and has started to get better but with certain movements he gets the sharp pain again.

## 2023-04-07 NOTE — ED Provider Notes (Signed)
[facility]    CSN: [id number] Arrival date & time: 04/07/23  1428      History   Chief Complaint Chief Complaint  Patient presents with   right wrist pain    HPI BRAZOS HUEBNER is a 67 y.o. male.   HPI Very pleasant 67 year old male presents with right wrist pain.  Patient reports this area has been painful since 03/20/2023.  Patient reports had been shoveling dirt prior to pain beginning.  PMH significant for Dupuytren's contracture of both hands, osteoporosis, and thoracic compression fracture.  Past Medical History:  Diagnosis Date   Anxiety    prn xanax started 01/2019->self d/c'd in fall/winter 2020   Barrett's esophagus    Bx+ 2007, 2009, and 2020.   Chronic constipation    GI->Linzess trial 05/2019->helpful   Contact dermatitis and other eczema, due to unspecified cause    +scarring   Diverticulosis 2021   noted on CT abd 06/2020   Epididymal cyst 06/2018   Bilat: small on R, but 4 cm on left  Asymptomatic.   GERD (gastroesophageal reflux disease)    History of adenomatous polyp of colon 08/10/2006; 01/2020   2007 and 12/2018; 01/2020, recall 2024   Hyperlipidemia    Incisional hernia    Left foot pain    ? Arch strain.  Some numbness to suggest possible tarsal tunnel syndrome vs lumbar radiculitis (sports med MD).  Responded very well to custom orthotic.   Migraine syndrome    verapamil prophylaxis, cluster migraine   Pseudophakia of both eyes    Seasonal and perennial allergic rhinitis    vaccine/injection therapy helpful   Small bowel obstruction (HCC) Fall of 2019   surgery->lysis of adhesions   Vertebral compression fracture (HCC) 05/2019   T11 and T12 and L 1, minimal trauma.  Dr. Karie Schwalbe put him on fosamax.    Patient Active Problem List   Diagnosis Date Noted   Dupuytren's contracture of both hands 03/21/2021   Presbyopia 09/03/2020   Generalized abdominal pain 05/29/2020   History of colonic polyps 12/14/2019   Umbilical hernia without  obstruction and without gangrene 12/14/2019   Osteoporosis 05/24/2019   Lumbar spondylosis 05/12/2019   Thoracic compression fracture (HCC) 05/12/2019   Chronic constipation 05/12/2019   Partial bowel obstruction (HCC) 02/19/2019   Left foot pain 01/21/2017   Maxillary sinusitis 07/19/2015   Chronic asthma 10/03/2012   Seasonal and perennial allergic rhinitis 06/05/2008   GERD 06/05/2008   Eczema 06/05/2008   Fatigue 02/21/2008    Past Surgical History:  Procedure Laterality Date   ABDOMINAL EXPLORATION SURGERY  Fall 2019   SBO->lysis of adhesions   COLONOSCOPY W/ POLYPECTOMY  08/10/06; 12/2018   Several adenomatous polyps 2007 and 2020; 01-25-2020. recall 2024   DEXA  05/24/2019   Dr. Toma Copier -2.4 + vert comp fx->fosamax started.  06/2021 T score -1.8   ESOPHAGOGASTRODUODENOSCOPY  2007 and 07/20/08;12/2018   GERD, BARRET's esoph (Dr. Marina Goodell). Same 12/2018.   INCISIONAL HERNIA REPAIR N/A 02/08/2020   Procedure: LAPAROSCOPIC INCISIONAL HERNIA REPAIR WITH MESH;  Surgeon: Kinsinger, De Blanch, MD;  Location: [facility];  Service: General;  Laterality: N/A;   INTRAOCULAR LENS INSERTION  09/03/2020   OU   LE venous doppler u/s  04/19/2015   L leg NORMAL   right knee arthroscopy  1980s   Torn meniscus (Dr. Thurston Hole)       Home Medications    Prior to Admission medications   Medication Sig Start Date End  Date Taking? Authorizing Provider  predniSONE (STERAPRED UNI-PAK 21 TAB) 10 MG (21) TBPK tablet Take by mouth daily. Take 6 tabs by mouth daily  for 2 days, then 5 tabs for 2 days, then 4 tabs for 2 days, then 3 tabs for 2 days, 2 tabs for 2 days, then 1 tab by mouth daily for 2 days 04/07/23  Yes Trevor Iha, FNP  alendronate (FOSAMAX) 70 MG tablet Take 1 tablet (70 mg total) by mouth every 7 (seven) days. Take with a full glass of water on an empty stomach. 05/29/22   Monica Becton, MD  atorvastatin (LIPITOR) 40 MG tablet Take 1 tablet (40 mg total) by mouth  daily. 01/06/23   McGowen, Maryjean Morn, MD  Calcium Carbonate-Vitamin D3 (CALCIUM + VITAMIN D3) 600-400 MG-UNIT TABS Take 1 tablet by mouth 2 (two) times daily. 03/21/21   Monica Becton, MD  cyclobenzaprine (FLEXERIL) 10 MG tablet TAKE 1 TABLET BY MOUTH TWICE A DAY AS NEEDED 05/12/22   McGowen, Maryjean Morn, MD  dicyclomine (BENTYL) 10 MG capsule TAKE 1 CAPSULE BY MOUTH TWICE A DAY 01/06/23   Zehr, Shanda Bumps D, PA-C  fluticasone (FLONASE) 50 MCG/ACT nasal spray SPRAY 2 SPRAYS INTO EACH NOSTRIL EVERY DAY 02/23/23   McGowen, Maryjean Morn, MD  guaiFENesin (MUCINEX) 600 MG 12 hr tablet Take by mouth 2 (two) times daily.    [provider]  ibuprofen (ADVIL) 200 MG tablet Take 4 tablets (800 mg total) by mouth every 8 (eight) hours as needed. 07/09/22   Monica Becton, MD  lansoprazole (PREVACID) 30 MG capsule Take 1 capsule (30 mg total) by mouth 2 (two) times daily. 11/12/22   McGowen, Maryjean Morn, MD  linaclotide (LINZESS) 290 MCG CAPS capsule TAKE 1 CAPSULE BY MOUTH DAILY BEFORE BREAKFAST. 09/17/22   Unk Lightning, PA  polyethylene glycol powder [facility]) 17 GM/SCOOP powder Take by mouth.    [provider]  verapamil (CALAN) 120 MG tablet Take 60 mg by mouth once. For migraines 06/19/19   [provider]    Family History Family History  Problem Relation Age of Onset   Other Father        chemical PNA siphoning gas   Healthy Brother    Colon cancer Neg Hx    Esophageal cancer Neg Hx    Rectal cancer Neg Hx    Stomach cancer Neg Hx     Social History Social History   Tobacco Use   Smoking status: Former    Packs/day: 1.00    Years: 10.00    Additional pack years: 0.00    Total pack years: 10.00    Types: Cigarettes    Quit date: 07/06/1993    Years since quitting: 29.7   Smokeless tobacco: Never  Vaping Use   Vaping Use: Never used  Substance Use Topics   Alcohol use: Yes    Comment: 1 or 2 daily   Drug use: No     Allergies    Oxycodone   Review of Systems Review of Systems  Musculoskeletal:        Right wrist pain since 03/20/2023.  All other systems reviewed and are negative.    Physical Exam Triage Vital Signs ED Triage Vitals  Enc Vitals Group     BP 04/07/23 1443 120/83     Pulse Rate 04/07/23 1442 76     Resp 04/07/23 1442 20     Temp 04/07/23 1442 (!) 97.5 F (36.4 C)  Temp src --      SpO2 04/07/23 1442 98 %     Weight --      Height --      Head Circumference --      Peak Flow --      Pain Score 04/07/23 1441 9     Pain Loc --      Pain Edu? --      Excl. in GC? --    No data found.  Updated Vital Signs BP 120/83   Pulse 76   Temp (!) 97.5 F (36.4 C)   Resp 20   SpO2 98%   Physical Exam Vitals and nursing note reviewed.  Constitutional:      Appearance: Normal appearance. He is normal weight.  HENT:     Head: Normocephalic and atraumatic.     Mouth/Throat:     Mouth: Mucous membranes are moist.     Pharynx: Oropharynx is clear.  Eyes:     Extraocular Movements: Extraocular movements intact.     Conjunctiva/sclera: Conjunctivae normal.     Pupils: Pupils are equal, round, and reactive to light.  Cardiovascular:     Rate and Rhythm: Normal rate and regular rhythm.     Pulses: Normal pulses.     Heart sounds: Normal heart sounds.  Pulmonary:     Effort: Pulmonary effort is normal.     Breath sounds: Normal breath sounds. No wheezing, rhonchi or rales.  Musculoskeletal:        General: Normal range of motion.     Cervical back: Normal range of motion and neck supple.     Comments: Right wrist (dorsum inferior aspect): TTP with mild soft tissue swelling noted limited range of motion/pain elicited with flexion, extension, and particularly ulnar deviation  Skin:    General: Skin is warm and dry.  Neurological:     General: No focal deficit present.     Mental Status: He is alert and oriented to person, place, and time. Mental status is at baseline.  Psychiatric:         Mood and Affect: Mood normal.        Behavior: Behavior normal.      UC Treatments / Results  Labs (all labs ordered are listed, but only abnormal results are displayed) Labs Reviewed - No data to display  EKG   Radiology DG Wrist Complete Right  Result Date: 04/07/2023 CLINICAL DATA:  Right wrist pain since father's day. Patient was shoveling dirt and had a sharp pain radiate down his arm. EXAM: RIGHT WRIST - COMPLETE 3+ VIEW COMPARISON:  None Available. FINDINGS: There is no evidence of fracture or dislocation. There is no evidence of arthropathy or other focal bone abnormality. Soft tissues are unremarkable. IMPRESSION: Negative. Electronically Signed   By: Emmaline Kluver M.D.   On: 04/07/2023 14:55    Procedures Procedures (including critical care time)  Medications Ordered in UC Medications - No data to display  Initial Impression / Assessment and Plan / UC Course  I have reviewed the triage vital signs and the nursing notes.  Pertinent labs & imaging results that were available during my care of the patient were reviewed by me and considered in my medical decision making (see chart for details).     MDM: 1.  Right wrist pain-right wrist x-ray revealed above; 2.  Muscle strain of right wrist, initial encounter-Rx'd Sterapred Unipak (tapering patient from 60 mg to 10 mg over the next 10 days) patient advised  to wear right wrist brace (quick-fit-for right wrist universal) placed on patient's right wrist prior to discharge) 24/7 except with bathing for the next 2 weeks.  Advised if symptoms worsen and/or unresolved please follow-up with PCP, [place] orthopedic provider or here for further evaluation.  Patient discharged home, hemodynamically stable. Final Clinical Impressions(s) / UC Diagnoses   Final diagnoses:  Right wrist pain  Muscle strain of right wrist, initial encounter     Discharge Instructions      Advised patient of right wrist x-ray results with  hardcopy and images provided.  Advised patient to take medication as directed with food to completion.  Advised patient to wear right wrist brace 24/7 for the next 2 weeks.  Advised patient to avoid overuse repetitive activities involving right wrist for the next 2 weeks.  Advised if symptoms worsen and/or unresolved please follow-up with PCP, [facility] orthopedic provider, or here for further evaluation.     ED Prescriptions     Medication Sig Dispense Auth. Provider   predniSONE (STERAPRED UNI-PAK 21 TAB) 10 MG (21) TBPK tablet Take by mouth daily. Take 6 tabs by mouth daily  for 2 days, then 5 tabs for 2 days, then 4 tabs for 2 days, then 3 tabs for 2 days, 2 tabs for 2 days, then 1 tab by mouth daily for 2 days 42 tablet Trevor Iha, FNP      PDMP not reviewed this encounter.   Trevor Iha, FNP 04/07/23 1659

## 2023-04-07 NOTE — Discharge Instructions (Addendum)
Advised patient of right wrist x-ray results with hardcopy and images provided.  Advised patient to take medication as directed with food to completion.  Advised patient to wear right wrist brace 24/7 for the next 2 weeks.  Advised patient to avoid overuse repetitive activities involving right wrist for the next 2 weeks.  Advised if symptoms worsen and/or unresolved please follow-up with PCP, [facility] orthopedic provider, or here for further evaluation.

## 2023-04-22 DIAGNOSIS — M9901 Segmental and somatic dysfunction of cervical region: Secondary | ICD-10-CM | POA: Diagnosis not present

## 2023-04-22 DIAGNOSIS — M50322 Other cervical disc degeneration at C5-C6 level: Secondary | ICD-10-CM | POA: Diagnosis not present

## 2023-04-22 DIAGNOSIS — M50321 Other cervical disc degeneration at C4-C5 level: Secondary | ICD-10-CM | POA: Diagnosis not present

## 2023-04-22 DIAGNOSIS — M50323 Other cervical disc degeneration at C6-C7 level: Secondary | ICD-10-CM | POA: Diagnosis not present

## 2023-05-11 NOTE — Patient Instructions (Signed)
It was very nice to see you today!   Please call [facility] to schedule your repeat colonoscopy, [phone]  PLEASE NOTE:    Please give ample time to the testing facility, and our office to run, receive and review results. Please do not call inquiring of results, even if you can see them in your chart. We will contact you as soon as we are able. If it has been over 1 week since the test was completed, and you have not yet heard from [place], then please call us.   If we ordered any referrals today, please let us know if you have not heard from their office within the next 2 weeks. You should receive a letter via MyChart confirming if the referral was approved and their office contact information to schedule.   Please try these tips to maintain a healthy lifestyle:  Eat most of your calories during the day when you are active. Eliminate processed foods including packaged sweets (pies, cakes, cookies), reduce intake of potatoes, white bread, white pasta, and white rice. Look for whole grain options, oat flour or almond flour.  Each meal should contain half fruits/vegetables, one quarter protein, and one quarter carbs (no bigger than a computer mouse).  Cut down on sweet beverages. This includes juice, soda, and sweet tea. Also watch fruit intake, though this is a healthier sweet option, it still contains natural sugar! Limit to 3 servings daily.  Drink at least 1 glass of water with each meal and aim for at least 8 glasses per day  Exercise at least 150 minutes every week.

## 2023-05-13 ENCOUNTER — Encounter: Payer: Self-pay | Admitting: Family Medicine

## 2023-05-13 ENCOUNTER — Ambulatory Visit: Payer: BC Managed Care – PPO | Admitting: Family Medicine

## 2023-05-13 VITALS — BP 118/79 | HR 73 | Wt 153.6 lb

## 2023-05-13 DIAGNOSIS — E7800 Pure hypercholesterolemia, unspecified: Secondary | ICD-10-CM

## 2023-05-13 DIAGNOSIS — F17290 Nicotine dependence, other tobacco product, uncomplicated: Secondary | ICD-10-CM

## 2023-05-13 DIAGNOSIS — S63501D Unspecified sprain of right wrist, subsequent encounter: Secondary | ICD-10-CM

## 2023-05-13 LAB — LIPID PANEL
Cholesterol: 158 mg/dL (ref 0–200)
HDL: 66.1 mg/dL (ref >39.00)
LDL Cholesterol: 79 mg/dL (ref 0–99)
NonHDL: 91.79
Total CHOL/HDL Ratio: 2
Triglycerides: 65 mg/dL (ref 0.0–149.0)
VLDL: 13 mg/dL (ref 0.0–40.0)

## 2023-05-13 LAB — ALT: ALT: 26 U/L (ref 0–53)

## 2023-05-13 LAB — AST: AST: 22 U/L (ref 0–37)

## 2023-05-13 MED ORDER — BUPROPION HCL ER (XL) 150 MG PO TB24: 150.0000 mg | ORAL_TABLET | Freq: Every day | ORAL | 1 refills | Status: DC

## 2023-05-13 NOTE — Progress Notes (Signed)
OFFICE VISIT  05/13/2023  CC:  Chief Complaint  Patient presents with   Medical Management of Chronic Issues    Patient is a 67 y.o. male who presents for 89-month follow-up hyperlipidemia and whitecoat hypertension.  INTERIM HX: Doing well other than some right wrist pain for the last 6 weeks or so.  He was using a shovel for a long time taking a whole and his right wrist started to hurt a lot.  He went to the emergency department where an x-ray was found to be normal.  He has been wearing a splint and it has improved some but not quite all the way.  Still some movements of the wrist trigger some pain.  It was not swollen. No other joints are bothering him.  He vapes.  He gets very irritable when he tries to go couple days without it.  Wants to have something to help him quit.    Past Medical History:  Diagnosis Date   Anxiety    prn xanax started 01/2019->self d/c'd in fall/winter 2020   Barrett's esophagus    Bx+ 2007, 2009, and 2020.   Chronic constipation    GI->Linzess trial 05/2019->helpful   Contact dermatitis and other eczema, due to unspecified cause    +scarring   Diverticulosis 2021   noted on CT abd 06/2020   Epididymal cyst 06/2018   Bilat: small on R, but 4 cm on left  Asymptomatic.   GERD (gastroesophageal reflux disease)    History of adenomatous polyp of colon 08/10/2006; 01/2020   2007 and 12/2018; 01/2020, recall 2024   Hyperlipidemia    Incisional hernia    Left foot pain    ? Arch strain.  Some numbness to suggest possible tarsal tunnel syndrome vs lumbar radiculitis (sports med MD).  Responded very well to custom orthotic.   Migraine syndrome    verapamil prophylaxis, cluster migraine   Pseudophakia of both eyes    Seasonal and perennial allergic rhinitis    vaccine/injection therapy helpful   Small bowel obstruction (HCC) Fall of 2019   surgery->lysis of adhesions   Vertebral compression fracture (HCC) 05/2019   T11 and T12 and L 1, minimal trauma.  Dr.  Karie Schwalbe put him on fosamax.    Past Surgical History:  Procedure Laterality Date   ABDOMINAL EXPLORATION SURGERY  Fall 2019   SBO->lysis of adhesions   COLONOSCOPY W/ POLYPECTOMY  08/10/06; 12/2018   Several adenomatous polyps 2007 and 2020; 01-25-2020. recall 2024   DEXA  05/24/2019   Dr. Toma Copier -2.4 + vert comp fx->fosamax started.  06/2021 T score -1.8   ESOPHAGOGASTRODUODENOSCOPY  2007 and 07/20/08;12/2018   GERD, BARRET's esoph (Dr. Marina Goodell). Same 12/2018.   INCISIONAL HERNIA REPAIR N/A 02/08/2020   Procedure: LAPAROSCOPIC INCISIONAL HERNIA REPAIR WITH MESH;  Surgeon: Kinsinger, De Blanch, MD;  Location: [facility];  Service: General;  Laterality: N/A;   INTRAOCULAR LENS INSERTION  09/03/2020   OU   LE venous doppler u/s  04/19/2015   L leg NORMAL   right knee arthroscopy  1980s   Torn meniscus (Dr. Thurston Hole)    Outpatient Medications Prior to Visit  Medication Sig Dispense Refill   alendronate (FOSAMAX) 70 MG tablet Take 1 tablet (70 mg total) by mouth every 7 (seven) days. Take with a full glass of water on an empty stomach. 12 tablet 3   atorvastatin (LIPITOR) 40 MG tablet Take 1 tablet (40 mg total) by mouth daily. 90 tablet 1   Calcium  Carbonate-Vitamin D3 (CALCIUM + VITAMIN D3) 600-400 MG-UNIT TABS Take 1 tablet by mouth 2 (two) times daily. 180 tablet 3   cyclobenzaprine (FLEXERIL) 10 MG tablet TAKE 1 TABLET BY MOUTH TWICE A DAY AS NEEDED 30 tablet 1   dicyclomine (BENTYL) 10 MG capsule TAKE 1 CAPSULE BY MOUTH TWICE A DAY 180 capsule 1   fluticasone (FLONASE) 50 MCG/ACT nasal spray SPRAY 2 SPRAYS INTO EACH NOSTRIL EVERY DAY 48 mL 0   ibuprofen (ADVIL) 200 MG tablet Take 4 tablets (800 mg total) by mouth every 8 (eight) hours as needed.     lansoprazole (PREVACID) 30 MG capsule Take 1 capsule (30 mg total) by mouth 2 (two) times daily. 180 capsule 3   linaclotide (LINZESS) 290 MCG CAPS capsule TAKE 1 CAPSULE BY MOUTH DAILY BEFORE BREAKFAST. 90 capsule 3    polyethylene glycol powder (GLYCOLAX/MIRALAX) 17 GM/SCOOP powder Take by mouth.     verapamil (CALAN) 120 MG tablet Take 60 mg by mouth once. For migraines     guaiFENesin (MUCINEX) 600 MG 12 hr tablet Take by mouth 2 (two) times daily. (Patient not taking: Reported on 05/13/2023)     predniSONE (STERAPRED UNI-PAK 21 TAB) 10 MG (21) TBPK tablet Take by mouth daily. Take 6 tabs by mouth daily  for 2 days, then 5 tabs for 2 days, then 4 tabs for 2 days, then 3 tabs for 2 days, 2 tabs for 2 days, then 1 tab by mouth daily for 2 days (Patient not taking: Reported on 05/13/2023) 42 tablet 0   No facility-administered medications prior to visit.    Allergies  Allergen Reactions   Oxycodone     nausea    Review of Systems As per HPI  PE:    05/13/2023    8:16 AM 04/07/2023    2:43 PM 04/07/2023    2:42 PM  Vitals with BMI  Weight 153 lbs 10 oz    Systolic 118 120   Diastolic 79 83   Pulse 73  76    Physical Exam  Gen: Alert, well appearing.  Patient is oriented to person, place, time, and situation. AFFECT: pleasant, lucid thought and speech. Right wrist without erythema or swelling.  Full range of motion.  At the maximal flexion and extension he does feel some pain deep in the wrist.  Isolated movement of the fingers does not elicit the pain. No significant areas of tenderness.  LABS:  Last CBC Lab Results  Component Value Date   WBC 6.4 11/12/2022   HGB 14.7 11/12/2022   HCT 43.7 11/12/2022   MCV 98.3 11/12/2022   MCH 33.3 (H) 05/29/2022   RDW 13.0 11/12/2022   PLT 391.0 11/12/2022   Last metabolic panel Lab Results  Component Value Date   GLUCOSE 88 11/12/2022   NA 137 11/12/2022   K 4.6 11/12/2022   CL 101 11/12/2022   CO2 25 11/12/2022   BUN 7 11/12/2022   CREATININE 0.89 11/12/2022   GFR 88.26 11/12/2022   CALCIUM 9.7 11/12/2022   PROT 7.2 11/12/2022   ALBUMIN 4.6 11/12/2022   BILITOT 1.0 11/12/2022   ALKPHOS 66 11/12/2022   AST 23 11/12/2022   ALT 25 11/12/2022    Last lipids Lab Results  Component Value Date   CHOL 173 11/12/2022   HDL 81.50 11/12/2022   LDLCALC 79 11/12/2022   TRIG 62.0 11/12/2022   CHOLHDL 2 11/12/2022   Last thyroid functions Lab Results  Component Value Date   TSH 1.94  06/23/2018   Last vitamin D Lab Results  Component Value Date   VD25OH 31 05/29/2022   IMPRESSION AND PLAN:  1 hypercholesterolemia, doing well on atorvastatin 40 mg a day. Lipid panel today.  #2 right wrist sprain/strain. Gradually improving status post a steroid burst prescribed by the emergency department about a month ago and immobilization with splint. Will give this a little more time.  He is established with Dr. Benjamin Stain in sports medicine and will return to see him as needed.  #3 nicotine addiction. Wellbutrin XL 150 mg prescribed today.  An After Visit Summary was printed and given to the patient.  FOLLOW UP: Return in about 6 weeks (around 06/24/2023) for vap cessation/wellbutrin.  Signed:  Santiago Bumpers, MD           05/13/2023

## 2023-05-20 ENCOUNTER — Encounter (INDEPENDENT_AMBULATORY_CARE_PROVIDER_SITE_OTHER): Payer: Self-pay

## 2023-05-27 DIAGNOSIS — M50323 Other cervical disc degeneration at C6-C7 level: Secondary | ICD-10-CM | POA: Diagnosis not present

## 2023-05-27 DIAGNOSIS — M50321 Other cervical disc degeneration at C4-C5 level: Secondary | ICD-10-CM | POA: Diagnosis not present

## 2023-05-27 DIAGNOSIS — M9901 Segmental and somatic dysfunction of cervical region: Secondary | ICD-10-CM | POA: Diagnosis not present

## 2023-05-27 DIAGNOSIS — M50322 Other cervical disc degeneration at C5-C6 level: Secondary | ICD-10-CM | POA: Diagnosis not present

## 2023-05-30 DIAGNOSIS — K56600 Partial intestinal obstruction, unspecified as to cause: Secondary | ICD-10-CM | POA: Diagnosis not present

## 2023-05-30 DIAGNOSIS — K753 Granulomatous hepatitis, not elsewhere classified: Secondary | ICD-10-CM | POA: Diagnosis not present

## 2023-05-30 DIAGNOSIS — K529 Noninfective gastroenteritis and colitis, unspecified: Secondary | ICD-10-CM | POA: Diagnosis not present

## 2023-05-30 DIAGNOSIS — N3289 Other specified disorders of bladder: Secondary | ICD-10-CM | POA: Diagnosis not present

## 2023-05-30 DIAGNOSIS — Z4682 Encounter for fitting and adjustment of non-vascular catheter: Secondary | ICD-10-CM | POA: Diagnosis not present

## 2023-05-30 DIAGNOSIS — R112 Nausea with vomiting, unspecified: Secondary | ICD-10-CM | POA: Diagnosis not present

## 2023-05-30 DIAGNOSIS — R1084 Generalized abdominal pain: Secondary | ICD-10-CM | POA: Diagnosis not present

## 2023-05-30 DIAGNOSIS — K5989 Other specified functional intestinal disorders: Secondary | ICD-10-CM | POA: Diagnosis not present

## 2023-05-30 DIAGNOSIS — K56609 Unspecified intestinal obstruction, unspecified as to partial versus complete obstruction: Secondary | ICD-10-CM | POA: Diagnosis not present

## 2023-05-30 DIAGNOSIS — K6389 Other specified diseases of intestine: Secondary | ICD-10-CM | POA: Diagnosis not present

## 2023-05-30 DIAGNOSIS — Z87891 Personal history of nicotine dependence: Secondary | ICD-10-CM | POA: Diagnosis not present

## 2023-06-02 ENCOUNTER — Telehealth: Payer: Self-pay

## 2023-06-02 NOTE — Transitions of Care (Post Inpatient/ED Visit) (Signed)
   06/02/2023  Name: Jacob Blanchard MRN: [id number] DOB: December 01, 1955  Today's TOC FU Call Status: Today's TOC FU Call Status:: Unsuccessful Call (1st Attempt) Unsuccessful Call (1st Attempt) Date: 06/02/23  Attempted to reach the patient regarding the most recent Inpatient/ED visit.  Follow Up Plan: Additional outreach attempts will be made to reach the patient to complete the Transitions of Care (Post Inpatient/ED visit) call.      Antionette Fairy, RN,BSN,CCM [facility] Care Management Care Management Community Coordinator Direct Phone: [phone] Toll Free: [phone] Fax: [phone]

## 2023-06-03 ENCOUNTER — Telehealth: Payer: Self-pay

## 2023-06-03 NOTE — Transitions of Care (Post Inpatient/ED Visit) (Signed)
   06/03/2023  Name: Jacob Blanchard MRN: [id number] DOB: [birth year]  Today's TOC FU Call Status: Today's TOC FU Call Status:: Unsuccessful Call (3rd Attempt) Unsuccessful Call (3rd Attempt) Date: 06/03/23  Attempted to reach the patient regarding the most recent Inpatient/ED visit.  Follow Up Plan: Additional outreach attempts will be made to reach the patient to complete the Transitions of Care (Post Inpatient/ED visit) call.     Antionette Fairy, RN,BSN,CCM [facility] Care Management Care Management Community Coordinator Direct Phone: [phone] Toll Free: [phone] Fax: [phone]

## 2023-06-03 NOTE — Transitions of Care (Post Inpatient/ED Visit) (Signed)
   06/03/2023  Name: Jacob Blanchard MRN: [id number] DOB: 08-19-1956  Today's TOC FU Call Status: Today's TOC FU Call Status:: Unsuccessful Call (3rd Attempt) Unsuccessful Call (3rd Attempt) Date: 06/03/23  Attempted to reach the patient regarding the most recent Inpatient/ED visit.  Follow Up Plan: No further outreach attempts will be made at this time. We have been unable to contact the patient.    Antionette Fairy, RN,BSN,CCM [facility] Care Management Care Management Community Coordinator Direct Phone: [phone] Toll Free: [phone] Fax: [phone]

## 2023-06-04 ENCOUNTER — Other Ambulatory Visit: Payer: Self-pay | Admitting: Family Medicine

## 2023-06-10 NOTE — Progress Notes (Unsigned)
06/11/2023  CC:  Chief Complaint  Patient presents with  . Hospitalization Follow-up    Patient is a 67 y.o. male who presents for  hospital follow up, specifically [facility] face-to-face visit. Dates hospitalized: 8/25 to 06/01/2023 Days since d/c from hospital: 10 days Patient was discharged from hospital to home Reason for admission to hospital: Partial small bowel obstruction Date of interactive (phone) contact with patient and/or caregiver: 3 attempts on 8/28 and 06/03/2023.  I have reviewed patient's discharge summary plus pertinent specific notes, labs, and imaging from the hospitalization.   CT abdomen pelvis on admission showed small bowel obstruction with transition point suspected in the left abdomen.  Also showed nonspecific colitis extending from the distal transverse colon through the sigmoid colon.  White blood cell count on admission was 24.4.  NG tube was placed.  Small bowel follow-through normal.  Abdominal pain resolved.  Diet was advanced. Discharged home without any procedures.  Currently : He has just recently advanced his diet from liquids to soft solids.  Eating does make him hurt a little bit in the right lower quadrant.  He has no nausea or vomiting or abdominal distention. He says the pain in the right lower quadrant that occurs after eating is a chronic problem, is often worse after he eats a large meal. He has had problems with constipation long-term, must take a Linzess tablet to have a bowel movement and these are usually large and loose. He has not been having any bloody stools, melena, or stools with pus.  Medication reconciliation was done today and patient is taking meds as recommended by discharging hospitalist/specialist.   Discharge medications: Alendronate 70 mg weekly, atorvastatin 40 mg a day, Wellbutrin XL 150 mg a day, calcium and vitamin D, cyclobenzaprine 5 mg twice daily as needed, dicyclomine 10 mg twice daily, Flonase daily,  Linzess 290 mcg daily, MiraLAX daily as needed, verapamil 120 mg 3 times daily  ROS as above, plus--> no fevers, no CP, no SOB, no wheezing, no cough, no dizziness, no HAs, no rashes, no melena/hematochezia.  No polyuria or polydipsia.  No myalgias or arthralgias.  No focal weakness, paresthesias, or tremors.  No acute vision or hearing abnormalities.  No dysuria or unusual/new urinary urgency or frequency.  No recent changes in lower legs.   No palpitations.    PMH:  Past Medical History:  Diagnosis Date  . Anxiety    prn xanax started 01/2019->self d/c'd in fall/winter 2020  . Barrett's esophagus    Bx+ 2007, 2009, and 2020.  Marland Kitchen Chronic constipation    GI->Linzess trial 05/2019->helpful  . Contact dermatitis and other eczema, due to unspecified cause    +scarring  . Diverticulosis 2021   noted on CT abd 06/2020  . Epididymal cyst 06/2018   Bilat: small on R, but 4 cm on left  Asymptomatic.  Marland Kitchen GERD (gastroesophageal reflux disease)   . History of adenomatous polyp of colon 08/10/2006; 01/2020   2007 and 12/2018; 01/2020, recall 2024  . Hyperlipidemia   . Incisional hernia   . Left foot pain    ? Arch strain.  Some numbness to suggest possible tarsal tunnel syndrome vs lumbar radiculitis (sports med MD).  Responded very well to custom orthotic.  . Migraine syndrome    verapamil prophylaxis, cluster migraine  . Pseudophakia of both eyes   . Seasonal and perennial allergic rhinitis    vaccine/injection therapy helpful  . Small bowel obstruction (HCC) Fall of 2019   surgery->lysis  of adhesions  . Vertebral compression fracture (HCC) 05/2019   T11 and T12 and L 1, minimal trauma.  Dr. Karie Schwalbe put him on fosamax.    PSH:  Past Surgical History:  Procedure Laterality Date  . ABDOMINAL EXPLORATION SURGERY  Fall 2019   SBO->lysis of adhesions  . COLONOSCOPY W/ POLYPECTOMY  08/10/06; 12/2018   Several adenomatous polyps 2007 and 2020; 01-25-2020. recall 2024  . DEXA  05/24/2019   Dr. Toma Copier  -2.4 + vert comp fx->fosamax started.  06/2021 T score -1.8  . ESOPHAGOGASTRODUODENOSCOPY  2007 and 07/20/08;12/2018   GERD, BARRET's esoph (Dr. Marina Goodell). Same 12/2018.  Marland Kitchen INCISIONAL HERNIA REPAIR N/A 02/08/2020   Procedure: LAPAROSCOPIC INCISIONAL HERNIA REPAIR WITH MESH;  Surgeon: Kinsinger, De Blanch, MD;  Location: [facility];  Service: General;  Laterality: N/A;  . INTRAOCULAR LENS INSERTION  09/03/2020   OU  . LE venous doppler u/s  04/19/2015   L leg NORMAL  . right knee arthroscopy  1980s   Torn meniscus (Dr. Thurston Hole)    MEDS:  Outpatient Medications Prior to Visit  Medication Sig Dispense Refill  . alendronate (FOSAMAX) 70 MG tablet Take 1 tablet (70 mg total) by mouth every 7 (seven) days. Take with a full glass of water on an empty stomach. 12 tablet 3  . atorvastatin (LIPITOR) 40 MG tablet Take 1 tablet (40 mg total) by mouth daily. 90 tablet 1  . buPROPion (WELLBUTRIN XL) 150 MG 24 hr tablet Take 1 tablet (150 mg total) by mouth daily. 30 tablet 1  . Calcium Carbonate-Vitamin D3 (CALCIUM + VITAMIN D3) 600-400 MG-UNIT TABS Take 1 tablet by mouth 2 (two) times daily. 180 tablet 3  . cyclobenzaprine (FLEXERIL) 10 MG tablet TAKE 1 TABLET BY MOUTH TWICE A DAY AS NEEDED 30 tablet 1  . dicyclomine (BENTYL) 10 MG capsule TAKE 1 CAPSULE BY MOUTH TWICE A DAY 180 capsule 1  . fluticasone (FLONASE) 50 MCG/ACT nasal spray SPRAY 2 SPRAYS INTO EACH NOSTRIL EVERY DAY 48 mL 0  . ibuprofen (ADVIL) 200 MG tablet Take 4 tablets (800 mg total) by mouth every 8 (eight) hours as needed.    . lansoprazole (PREVACID) 30 MG capsule Take 1 capsule (30 mg total) by mouth 2 (two) times daily. 180 capsule 3  . linaclotide (LINZESS) 290 MCG CAPS capsule TAKE 1 CAPSULE BY MOUTH DAILY BEFORE BREAKFAST. 90 capsule 3  . polyethylene glycol powder (GLYCOLAX/MIRALAX) 17 GM/SCOOP powder Take by mouth.    . pseudoephedrine (SUDAFED) 30 MG tablet Take 30 mg by mouth every 4 (four) hours as needed  (congestion).    . verapamil (CALAN) 120 MG tablet Take 60 mg by mouth once. For migraines    . guaiFENesin (MUCINEX) 600 MG 12 hr tablet Take by mouth 2 (two) times daily. (Patient not taking: Reported on 05/13/2023)    . predniSONE (STERAPRED UNI-PAK 21 TAB) 10 MG (21) TBPK tablet Take by mouth daily. Take 6 tabs by mouth daily  for 2 days, then 5 tabs for 2 days, then 4 tabs for 2 days, then 3 tabs for 2 days, 2 tabs for 2 days, then 1 tab by mouth daily for 2 days (Patient not taking: Reported on 05/13/2023) 42 tablet 0   No facility-administered medications prior to visit.    Physical Exam    06/11/2023   11:28 AM 05/13/2023    8:16 AM 04/07/2023    2:43 PM  Vitals with BMI  Height 5\' 10"     Weight  147 lbs 6 oz 153 lbs 10 oz   BMI 21.15    Systolic 129 118 161  Diastolic 85 79 83  Pulse 87 73    Gen: Alert, well appearing.  Patient is oriented to person, place, time, and situation. AFFECT: pleasant, lucid thought and speech. CV: RRR, no m/r/g.   LUNGS: CTA bilat, nonlabored resps, good aeration in all lung fields. ABD: Soft, bowel sounds hypoactive, nondistended.  He has mild tenderness to palpation in the right lower quadrant without guarding or rebound. No hepatosplenomegaly, mass, or bruit. No edema.   Pertinent labs/imaging Last CBC Lab Results  Component Value Date   WBC 6.4 11/12/2022   HGB 14.7 11/12/2022   HCT 43.7 11/12/2022   MCV 98.3 11/12/2022   MCH 33.3 (H) 05/29/2022   RDW 13.0 11/12/2022   PLT 391.0 11/12/2022   Last metabolic panel Lab Results  Component Value Date   GLUCOSE 88 11/12/2022   NA 137 11/12/2022   K 4.6 11/12/2022   CL 101 11/12/2022   CO2 25 11/12/2022   BUN 7 11/12/2022   CREATININE 0.89 11/12/2022   GFR 88.26 11/12/2022   CALCIUM 9.7 11/12/2022   PROT 7.2 11/12/2022   ALBUMIN 4.6 11/12/2022   BILITOT 1.0 11/12/2022   ALKPHOS 66 11/12/2022   AST 22 05/13/2023   ALT 26 05/13/2023   ASSESSMENT/PLAN:  ***  Recheck CBC, check sed  rate, check BMET. Recommended patient make follow-up appointment with his GI MD. Advance diet as tolerated. No new meds.  Medical decision making of moderate complexity was utilized today.  FOLLOW UP:  ***  Signed:  Santiago Bumpers, MD           06/11/2023

## 2023-06-11 ENCOUNTER — Ambulatory Visit: Payer: BC Managed Care – PPO | Admitting: Family Medicine

## 2023-06-11 ENCOUNTER — Encounter: Payer: Self-pay | Admitting: Family Medicine

## 2023-06-11 VITALS — BP 129/85 | HR 87 | Temp 97.3°F | Ht 70.0 in | Wt 147.4 lb

## 2023-06-11 DIAGNOSIS — K56609 Unspecified intestinal obstruction, unspecified as to partial versus complete obstruction: Secondary | ICD-10-CM

## 2023-06-11 DIAGNOSIS — D72829 Elevated white blood cell count, unspecified: Secondary | ICD-10-CM

## 2023-06-11 DIAGNOSIS — K529 Noninfective gastroenteritis and colitis, unspecified: Secondary | ICD-10-CM | POA: Diagnosis not present

## 2023-06-11 LAB — CBC WITH DIFFERENTIAL/PLATELET
Basophils Absolute: 0.1 10*3/uL (ref 0.0–0.1)
Basophils Relative: 0.7 % (ref 0.0–3.0)
Eosinophils Absolute: 0.2 10*3/uL (ref 0.0–0.7)
Eosinophils Relative: 2.5 % (ref 0.0–5.0)
HCT: 39.6 % (ref 39.0–52.0)
Hemoglobin: 12.9 g/dL — ABNORMAL LOW (ref 13.0–17.0)
Lymphocytes Relative: 20.7 % (ref 12.0–46.0)
Lymphs Abs: 1.4 10*3/uL (ref 0.7–4.0)
MCHC: 32.6 g/dL (ref 30.0–36.0)
MCV: 97 fl (ref 78.0–100.0)
Monocytes Absolute: 0.8 10*3/uL (ref 0.1–1.0)
Monocytes Relative: 11.5 % (ref 3.0–12.0)
Neutro Abs: 4.5 10*3/uL (ref 1.4–7.7)
Neutrophils Relative %: 64.6 % (ref 43.0–77.0)
Platelets: 651 10*3/uL — ABNORMAL HIGH (ref 150.0–400.0)
RBC: 4.08 Mil/uL — ABNORMAL LOW (ref 4.22–5.81)
RDW: 13.1 % (ref 11.5–15.5)
WBC: 7 10*3/uL (ref 4.0–10.5)

## 2023-06-11 LAB — BASIC METABOLIC PANEL
BUN: 6 mg/dL (ref 6–23)
CO2: 29 meq/L (ref 19–32)
Calcium: 9.3 mg/dL (ref 8.4–10.5)
Chloride: 99 meq/L (ref 96–112)
Creatinine, Ser: 0.76 mg/dL (ref 0.40–1.50)
GFR: 92.19 mL/min (ref >60.00)
Glucose, Bld: 74 mg/dL (ref 70–99)
Potassium: 4.6 meq/L (ref 3.5–5.1)
Sodium: 135 meq/L (ref 135–145)

## 2023-06-11 LAB — SEDIMENTATION RATE: Sed Rate: 46 mm/h — ABNORMAL HIGH (ref 0–20)

## 2023-06-11 MED ORDER — BUPROPION HCL ER (XL) 150 MG PO TB24: 150.0000 mg | ORAL_TABLET | Freq: Every day | ORAL | 1 refills | Status: DC

## 2023-06-11 MED ORDER — ATORVASTATIN CALCIUM 40 MG PO TABS: 40.0000 mg | ORAL_TABLET | Freq: Every day | ORAL | 1 refills | Status: DC

## 2023-06-11 NOTE — Patient Instructions (Signed)
Make an appointment with your gastroenterologist

## 2023-06-21 ENCOUNTER — Other Ambulatory Visit: Payer: Self-pay | Admitting: Sports Medicine

## 2023-06-21 ENCOUNTER — Telehealth: Payer: Self-pay

## 2023-06-21 DIAGNOSIS — M8000XD Age-related osteoporosis with current pathological fracture, unspecified site, subsequent encounter for fracture with routine healing: Secondary | ICD-10-CM

## 2023-06-21 NOTE — Telephone Encounter (Signed)
Patient denies any fever, diarrhea. He report that he is eating better more solids however he has experiencing some constipation after eating and he is still having pain in upper right portion of his stomach near belly button.

## 2023-06-21 NOTE — Telephone Encounter (Signed)
Patient returning call about lab results.  Please call patient back when available.  **Please call patient work mobile [phone].**

## 2023-06-21 NOTE — Telephone Encounter (Signed)
Jacob Massed, MD 06/14/2023  5:46 PM EDT     Please call patient and notify him that a couple of his labs indicated and inflammatory response. This is possibly due to the recent small bowel obstruction that he had but these labs were not elevated when he was in the hospital. Please see how his symptoms are: Abdominal pain leading up some?  Able to eat better?  Any fever or diarrhea?    LVM for pt to return call.

## 2023-06-21 NOTE — Telephone Encounter (Signed)
Please review and advise.

## 2023-06-21 NOTE — Telephone Encounter (Signed)
Okay, no new instructions at this time

## 2023-06-22 NOTE — Telephone Encounter (Signed)
LVM for pt to return call.  Note: if pt returns call, please advise Dr.McGowen said no new instructions at this time.

## 2023-06-25 ENCOUNTER — Encounter: Payer: Self-pay | Admitting: Family Medicine

## 2023-06-25 ENCOUNTER — Ambulatory Visit: Payer: BC Managed Care – PPO | Admitting: Family Medicine

## 2023-06-25 ENCOUNTER — Ambulatory Visit: Payer: BC Managed Care – PPO

## 2023-06-25 VITALS — BP 122/77 | HR 78 | Ht 70.0 in | Wt 145.2 lb

## 2023-06-25 DIAGNOSIS — F17203 Nicotine dependence unspecified, with withdrawal: Secondary | ICD-10-CM | POA: Diagnosis not present

## 2023-06-25 DIAGNOSIS — M25531 Pain in right wrist: Secondary | ICD-10-CM

## 2023-06-25 DIAGNOSIS — Z716 Tobacco abuse counseling: Secondary | ICD-10-CM | POA: Diagnosis not present

## 2023-06-25 DIAGNOSIS — M19031 Primary osteoarthritis, right wrist: Secondary | ICD-10-CM | POA: Diagnosis not present

## 2023-06-25 NOTE — Progress Notes (Signed)
OFFICE VISIT  06/25/2023  CC:  Chief Complaint  Patient presents with   Medical Management of Chronic Issues    6 week f/u, GI appt 12/4 Shanda Bumps Zehr)    Patient is a 67 y.o. male who presents for 6 weeks f/u vap cessation counseling/wellbutrin. A/P as of last visit: "1) SBO (recurrence, suspect adhesions), doing pretty well, w/out sign of recurrent obstruction. Advance diet as tolerated.   2) Chronic postprandial RLQ pain:  does not seem to be progressing. Cont linzess 290 mcg every day helpful.   3) Nonspecific colitis on CT 8/26 in hosp->clinical correlation not clear. Encouraged pt to f/u with his GI provider to see if any further eval for this finding is needed. Recheck CBC, check sed rate,   4) Leukocytosis, suspect acute stress rxn: Recheck CBC, check sed rate, check BMET."  #5 nicotine addiction, vaping. Start Wellbutrin XL 150 mg a day.  INTERIM HX: Doing well with quitting vape.  He did take a puff yesterday. He has had some irritability but not too bad.  Right wrist pain started after acute injury sustained after doing some heavy shoveling back in June--> radiographs at that time negative.  Has been wearing a splint consistently and feels like it is gradually getting better.  No swelling or redness.  Range of motion of the wrist and thumb seem to elicit the pain the most.  Past Medical History:  Diagnosis Date   Anxiety    prn xanax started 01/2019->self d/c'd in fall/winter 2020   Barrett's esophagus    Bx+ 2007, 2009, and 2020.   Chronic constipation    GI->Linzess trial 05/2019->helpful   Contact dermatitis and other eczema, due to unspecified cause    +scarring   Diverticulosis 2021   noted on CT abd 06/2020   Epididymal cyst 06/2018   Bilat: small on R, but 4 cm on left  Asymptomatic.   GERD (gastroesophageal reflux disease)    History of adenomatous polyp of colon 08/10/2006; 01/2020   2007 and 12/2018; 01/2020, recall 2024   Hyperlipidemia     Incisional hernia    Left foot pain    ? Arch strain.  Some numbness to suggest possible tarsal tunnel syndrome vs lumbar radiculitis (sports med MD).  Responded very well to custom orthotic.   Migraine syndrome    verapamil prophylaxis, cluster migraine   Pseudophakia of both eyes    Seasonal and perennial allergic rhinitis    vaccine/injection therapy helpful   Small bowel obstruction (HCC) Fall of 2019   surgery->lysis of adhesions   Vertebral compression fracture (HCC) 05/2019   T11 and T12 and L 1, minimal trauma.  Dr. Karie Schwalbe put him on fosamax.    Past Surgical History:  Procedure Laterality Date   ABDOMINAL EXPLORATION SURGERY  Fall 2019   SBO->lysis of adhesions   COLONOSCOPY W/ POLYPECTOMY  08/10/06; 12/2018   Several adenomatous polyps 2007 and 2020; 01-25-2020. recall 2024   DEXA  05/24/2019   Dr. Toma Copier -2.4 + vert comp fx->fosamax started.  06/2021 T score -1.8   ESOPHAGOGASTRODUODENOSCOPY  2007 and 07/20/08;12/2018   GERD, BARRET's esoph (Dr. Marina Goodell). Same 12/2018.   INCISIONAL HERNIA REPAIR N/A 02/08/2020   Procedure: LAPAROSCOPIC INCISIONAL HERNIA REPAIR WITH MESH;  Surgeon: Kinsinger, De Blanch, MD;  Location: [facility];  Service: General;  Laterality: N/A;   INTRAOCULAR LENS INSERTION  09/03/2020   OU   LE venous doppler u/s  04/19/2015   L leg NORMAL   right  knee arthroscopy  1980s   Torn meniscus (Dr. Thurston Hole)    Outpatient Medications Prior to Visit  Medication Sig Dispense Refill   alendronate (FOSAMAX) 70 MG tablet TAKE 1 TABLET (70 MG TOTAL) BY MOUTH EVERY 7 DAYS WITH FULL GLASS WATER ON EMPTY STOMACH 12 tablet 3   atorvastatin (LIPITOR) 40 MG tablet Take 1 tablet (40 mg total) by mouth daily. 90 tablet 1   buPROPion (WELLBUTRIN XL) 150 MG 24 hr tablet Take 1 tablet (150 mg total) by mouth daily. 90 tablet 1   Calcium Carbonate-Vitamin D3 (CALCIUM + VITAMIN D3) 600-400 MG-UNIT TABS Take 1 tablet by mouth 2 (two) times daily. 180 tablet 3    cyclobenzaprine (FLEXERIL) 10 MG tablet TAKE 1 TABLET BY MOUTH TWICE A DAY AS NEEDED 30 tablet 1   dicyclomine (BENTYL) 10 MG capsule TAKE 1 CAPSULE BY MOUTH TWICE A DAY 180 capsule 1   fluticasone (FLONASE) 50 MCG/ACT nasal spray SPRAY 2 SPRAYS INTO EACH NOSTRIL EVERY DAY 48 mL 0   ibuprofen (ADVIL) 200 MG tablet Take 4 tablets (800 mg total) by mouth every 8 (eight) hours as needed.     lansoprazole (PREVACID) 30 MG capsule Take 1 capsule (30 mg total) by mouth 2 (two) times daily. 180 capsule 3   linaclotide (LINZESS) 290 MCG CAPS capsule TAKE 1 CAPSULE BY MOUTH DAILY BEFORE BREAKFAST. 90 capsule 3   polyethylene glycol powder (GLYCOLAX/MIRALAX) 17 GM/SCOOP powder Take by mouth.     pseudoephedrine (SUDAFED) 30 MG tablet Take 30 mg by mouth every 4 (four) hours as needed (congestion).     verapamil (CALAN) 120 MG tablet Take 60 mg by mouth once. For migraines     guaiFENesin (MUCINEX) 600 MG 12 hr tablet Take by mouth 2 (two) times daily. (Patient not taking: Reported on 05/13/2023)     No facility-administered medications prior to visit.    Allergies  Allergen Reactions   Oxycodone     nausea    Review of Systems As per HPI  PE:    06/25/2023    8:02 AM 06/11/2023   11:28 AM 05/13/2023    8:16 AM  Vitals with BMI  Height 5\' 10"  5\' 10"    Weight 145 lbs 3 oz 147 lbs 6 oz 153 lbs 10 oz  BMI 20.83 21.15   Systolic 122 129 829  Diastolic 77 85 79  Pulse 78 87 73   Physical Exam  Gen: Alert, well appearing.  Patient is oriented to person, place, time, and situation. Right wrist without swelling.  Mild tenderness over the dorsal base of the wrist at the proximal carpal row.  Little bit of tenderness over the base of the thumb as well.  No definite anatomic snuffbox tenderness. Range of motion of wrist and thumb and fingers intact.  LABS:  Last CBC Lab Results  Component Value Date   WBC 7.0 06/11/2023   HGB 12.9 (L) 06/11/2023   HCT 39.6 06/11/2023   MCV 97.0 06/11/2023   MCH  33.3 (H) 05/29/2022   RDW 13.1 06/11/2023   PLT 651.0 (H) 06/11/2023   Lab Results  Component Value Date   ESRSEDRATE 46 (H) 06/11/2023   Last metabolic panel Lab Results  Component Value Date   GLUCOSE 74 06/11/2023   NA 135 06/11/2023   K 4.6 06/11/2023   CL 99 06/11/2023   CO2 29 06/11/2023   BUN 6 06/11/2023   CREATININE 0.76 06/11/2023   GFR 92.19 06/11/2023   CALCIUM 9.3 06/11/2023  PROT 7.2 11/12/2022   ALBUMIN 4.6 11/12/2022   BILITOT 1.0 11/12/2022   ALKPHOS 66 11/12/2022   AST 22 05/13/2023   ALT 26 05/13/2023   IMPRESSION AND PLAN:  #1 nicotine cessation (vap): Doing well on Wellbutrin XL 150 mg daily. Increase to 300 mg daily.  He will finish out his 150 mg tabs by taking 2 at a time.  When he is running out of these he will call and I will send in a prescription for the 300 mg strength.  2.  Right wrist sprain, ongoing pain.  Would expect this to have been significantly improved by now with the immobilization he has been doing. Will check wrist x-ray again make sure there is no scaphoid fracture showing up. Continue with splint for now.  If not significantly improved in 6 weeks then we will consider injection and/or get him back to see Dr. Benjamin Stain, whom he is established with.  An After Visit Summary was printed and given to the patient.  FOLLOW UP: No follow-ups on file.  Signed:  Santiago Bumpers, MD           06/25/2023

## 2023-07-01 ENCOUNTER — Other Ambulatory Visit: Payer: Self-pay | Admitting: Gastroenterology

## 2023-07-15 ENCOUNTER — Encounter: Payer: Self-pay | Admitting: Family Medicine

## 2023-07-15 ENCOUNTER — Other Ambulatory Visit: Payer: Self-pay | Admitting: Family Medicine

## 2023-07-15 DIAGNOSIS — M5134 Other intervertebral disc degeneration, thoracic region: Secondary | ICD-10-CM | POA: Diagnosis not present

## 2023-07-15 DIAGNOSIS — M9901 Segmental and somatic dysfunction of cervical region: Secondary | ICD-10-CM | POA: Diagnosis not present

## 2023-07-15 DIAGNOSIS — M9902 Segmental and somatic dysfunction of thoracic region: Secondary | ICD-10-CM | POA: Diagnosis not present

## 2023-07-15 DIAGNOSIS — S6991XD Unspecified injury of right wrist, hand and finger(s), subsequent encounter: Secondary | ICD-10-CM

## 2023-07-15 DIAGNOSIS — M25531 Pain in right wrist: Secondary | ICD-10-CM

## 2023-07-15 DIAGNOSIS — M50323 Other cervical disc degeneration at C6-C7 level: Secondary | ICD-10-CM | POA: Diagnosis not present

## 2023-07-22 DIAGNOSIS — M13831 Other specified arthritis, right wrist: Secondary | ICD-10-CM | POA: Diagnosis not present

## 2023-07-22 DIAGNOSIS — M19031 Primary osteoarthritis, right wrist: Secondary | ICD-10-CM | POA: Insufficient documentation

## 2023-08-06 ENCOUNTER — Ambulatory Visit: Payer: BC Managed Care – PPO | Admitting: Family Medicine

## 2023-08-06 ENCOUNTER — Encounter: Payer: Self-pay | Admitting: Family Medicine

## 2023-08-06 VITALS — BP 138/91 | HR 81 | Wt 145.4 lb

## 2023-08-06 DIAGNOSIS — F17290 Nicotine dependence, other tobacco product, uncomplicated: Secondary | ICD-10-CM | POA: Diagnosis not present

## 2023-08-06 NOTE — Progress Notes (Signed)
OFFICE VISIT  08/06/2023  CC:  Chief Complaint  Patient presents with   Wrist Pain    Pt states he has good days and bad days. He will turn wrist a certain way and will have severe pain. Went to [facility] 2 weeks ago and was told he had some arthritis.    Nicotine Dependence    Pt also mentions wanting to discuss a different option besides Wellbutrin. Pt states he feels very aggravated due to not using nicotine.      Patient is a 67 y.o. male who presents for 6-week follow-up right wrist pain and vape cessation. A/P as of last visit: "#1 nicotine cessation (vap): Doing well on Wellbutrin XL 150 mg daily. Increase to 300 mg daily.  He will finish out his 150 mg tabs by taking 2 at a time.  When he is running out of these he will call and I will send in a prescription for the 300 mg strength.   2.  Right wrist sprain, ongoing pain.  Would expect this to have been significantly improved by now with the immobilization he has been doing. Will check wrist x-ray again make sure there is no scaphoid fracture showing up. Continue with splint for now.  If not significantly improved in 6 weeks then we will consider injection and/or get him back to see Dr. Benjamin Stain, whom he is established with."  INTERIM HX: He has not been smoke or vaping since I last saw him. He does feel somewhat irritable most of the time.  No panic attacks.  No depressed mood. He continued on the 150 mg Wellbutrin dose instead of transitioning to the 300 mg dose.  He got his wrist evaluated by Dr. Yehuda Budd a couple of weeks ago.  From patient's description and review of the note it looks like some combination of ligamentous and arthritic etiology is suspected. He describes a radial ulnar wrist injection being given.  Kastiel thinks it may have helped a little bit.  He still has pain with doing certain movements of his wrist but other times no pain at all.  He continues to wear the splint.   Past Medical History:   Diagnosis Date   Anxiety    prn xanax started 01/2019->self d/c'd in fall/winter 2020   Barrett's esophagus    Bx+ 2007, 2009, and 2020.   Chronic constipation    GI->Linzess trial 05/2019->helpful   Contact dermatitis and other eczema, due to unspecified cause    +scarring   Diverticulosis 2021   noted on CT abd 06/2020   Epididymal cyst 06/2018   Bilat: small on R, but 4 cm on left  Asymptomatic.   GERD (gastroesophageal reflux disease)    History of adenomatous polyp of colon 08/10/2006; 01/2020   2007 and 12/2018; 01/2020, recall 2024   Hyperlipidemia    Incisional hernia    Left foot pain    ? Arch strain.  Some numbness to suggest possible tarsal tunnel syndrome vs lumbar radiculitis (sports med MD).  Responded very well to custom orthotic.   Migraine syndrome    verapamil prophylaxis, cluster migraine   Pseudophakia of both eyes    Right wrist pain    04/2023 Sprain, scapholunate widening on f/u xray 06/2023-->ref ortho   Seasonal and perennial allergic rhinitis    vaccine/injection therapy helpful   Small bowel obstruction (HCC) Fall of 2019   surgery->lysis of adhesions   Vertebral compression fracture (HCC) 05/2019   T11 and T12 and L 1,  minimal trauma.  Dr. Karie Schwalbe put him on fosamax.    Past Surgical History:  Procedure Laterality Date   ABDOMINAL EXPLORATION SURGERY  Fall 2019   SBO->lysis of adhesions   COLONOSCOPY W/ POLYPECTOMY  08/10/06; 12/2018   Several adenomatous polyps 2007 and 2020; 01-25-2020. recall 2024   DEXA  05/24/2019   Dr. Toma Copier -2.4 + vert comp fx->fosamax started.  06/2021 T score -1.8   ESOPHAGOGASTRODUODENOSCOPY  2007 and 07/20/08;12/2018   GERD, BARRET's esoph (Dr. Marina Goodell). Same 12/2018.   INCISIONAL HERNIA REPAIR N/A 02/08/2020   Procedure: LAPAROSCOPIC INCISIONAL HERNIA REPAIR WITH MESH;  Surgeon: Kinsinger, De Blanch, MD;  Location: [facility];  Service: General;  Laterality: N/A;   INTRAOCULAR LENS INSERTION  09/03/2020   OU    LE venous doppler u/s  04/19/2015   L leg NORMAL   right knee arthroscopy  1980s   Torn meniscus (Dr. Thurston Hole)    Outpatient Medications Prior to Visit  Medication Sig Dispense Refill   alendronate (FOSAMAX) 70 MG tablet TAKE 1 TABLET (70 MG TOTAL) BY MOUTH EVERY 7 DAYS WITH FULL GLASS WATER ON EMPTY STOMACH 12 tablet 3   atorvastatin (LIPITOR) 40 MG tablet Take 1 tablet (40 mg total) by mouth daily. 90 tablet 1   buPROPion (WELLBUTRIN XL) 150 MG 24 hr tablet Take 1 tablet (150 mg total) by mouth daily. 90 tablet 1   Calcium Carbonate-Vitamin D3 (CALCIUM + VITAMIN D3) 600-400 MG-UNIT TABS Take 1 tablet by mouth 2 (two) times daily. 180 tablet 3   cyclobenzaprine (FLEXERIL) 10 MG tablet TAKE 1 TABLET BY MOUTH TWICE A DAY AS NEEDED 30 tablet 1   dicyclomine (BENTYL) 10 MG capsule TAKE 1 CAPSULE BY MOUTH TWICE A DAY 180 capsule 1   fluticasone (FLONASE) 50 MCG/ACT nasal spray SPRAY 2 SPRAYS INTO EACH NOSTRIL EVERY DAY 48 mL 0   ibuprofen (ADVIL) 200 MG tablet Take 4 tablets (800 mg total) by mouth every 8 (eight) hours as needed.     lansoprazole (PREVACID) 30 MG capsule Take 1 capsule (30 mg total) by mouth 2 (two) times daily. 180 capsule 3   linaclotide (LINZESS) 290 MCG CAPS capsule TAKE 1 CAPSULE BY MOUTH DAILY BEFORE BREAKFAST. 90 capsule 3   polyethylene glycol powder (GLYCOLAX/MIRALAX) 17 GM/SCOOP powder Take by mouth.     verapamil (CALAN) 120 MG tablet Take 60 mg by mouth once. For migraines     guaiFENesin (MUCINEX) 600 MG 12 hr tablet Take by mouth 2 (two) times daily.     pseudoephedrine (SUDAFED) 30 MG tablet Take 30 mg by mouth every 4 (four) hours as needed (congestion).     No facility-administered medications prior to visit.    Allergies  Allergen Reactions   Oxycodone     nausea    Review of Systems As per HPI  PE:    08/06/2023    8:40 AM 06/25/2023    8:02 AM 06/11/2023   11:28 AM  Vitals with BMI  Height  5\' 10"  5\' 10"   Weight 145 lbs 6 oz 145 lbs 3 oz 147 lbs  6 oz  BMI  20.83 21.15  Systolic 138 122 119  Diastolic 91 77 85  Pulse 81 78 87     Physical Exam  Gen: Alert, well appearing.  Patient is oriented to person, place, time, and situation. AFFECT: pleasant, lucid thought and speech. No further exam today.  LABS:  Last CBC Lab Results  Component Value Date   WBC  7.0 06/11/2023   HGB 12.9 (L) 06/11/2023   HCT 39.6 06/11/2023   MCV 97.0 06/11/2023   MCH 33.3 (H) 05/29/2022   RDW 13.1 06/11/2023   PLT 651.0 (H) 06/11/2023   Last metabolic panel Lab Results  Component Value Date   GLUCOSE 74 06/11/2023   NA 135 06/11/2023   K 4.6 06/11/2023   CL 99 06/11/2023   CO2 29 06/11/2023   BUN 6 06/11/2023   CREATININE 0.76 06/11/2023   GFR 92.19 06/11/2023   CALCIUM 9.3 06/11/2023   PROT 7.2 11/12/2022   ALBUMIN 4.6 11/12/2022   BILITOT 1.0 11/12/2022   ALKPHOS 66 11/12/2022   AST 22 05/13/2023   ALT 26 05/13/2023   IMPRESSION AND PLAN:  #1 nicotine dependence. He is off nicotine completely now for couple of months.  Wellbutrin seems to have helped.  He continues with some irritability, though. He will consider changing Wellbutrin XL 150 mg to an every other day dosing for a couple of weeks.  Okay to discontinue at that time if doing well.  An After Visit Summary was printed and given to the patient.  FOLLOW UP: Return for annual CPE (fasting).  Signed:  Santiago Bumpers, MD           08/06/2023

## 2023-08-19 DIAGNOSIS — M5134 Other intervertebral disc degeneration, thoracic region: Secondary | ICD-10-CM | POA: Diagnosis not present

## 2023-08-19 DIAGNOSIS — M9901 Segmental and somatic dysfunction of cervical region: Secondary | ICD-10-CM | POA: Diagnosis not present

## 2023-08-19 DIAGNOSIS — M50323 Other cervical disc degeneration at C6-C7 level: Secondary | ICD-10-CM | POA: Diagnosis not present

## 2023-08-19 DIAGNOSIS — M9902 Segmental and somatic dysfunction of thoracic region: Secondary | ICD-10-CM | POA: Diagnosis not present

## 2023-09-07 ENCOUNTER — Other Ambulatory Visit: Payer: Self-pay | Admitting: Family Medicine

## 2023-09-08 ENCOUNTER — Encounter: Payer: Self-pay | Admitting: Gastroenterology

## 2023-09-08 ENCOUNTER — Ambulatory Visit: Payer: BC Managed Care – PPO | Admitting: Gastroenterology

## 2023-09-08 VITALS — BP 122/74 | HR 86 | Ht 70.0 in | Wt 148.0 lb

## 2023-09-08 DIAGNOSIS — G8929 Other chronic pain: Secondary | ICD-10-CM | POA: Diagnosis not present

## 2023-09-08 DIAGNOSIS — Z860101 Personal history of adenomatous and serrated colon polyps: Secondary | ICD-10-CM

## 2023-09-08 DIAGNOSIS — R109 Unspecified abdominal pain: Secondary | ICD-10-CM

## 2023-09-08 DIAGNOSIS — K5909 Other constipation: Secondary | ICD-10-CM | POA: Diagnosis not present

## 2023-09-08 DIAGNOSIS — Z8719 Personal history of other diseases of the digestive system: Secondary | ICD-10-CM

## 2023-09-08 DIAGNOSIS — K219 Gastro-esophageal reflux disease without esophagitis: Secondary | ICD-10-CM | POA: Diagnosis not present

## 2023-09-08 MED ORDER — LINACLOTIDE 290 MCG PO CAPS: ORAL_CAPSULE | ORAL | 3 refills | Status: AC

## 2023-09-08 MED ORDER — NA SULFATE-K SULFATE-MG SULF 17.5-3.13-1.6 GM/177ML PO SOLN: 1.0000 | Freq: Once | ORAL | 0 refills | Status: AC

## 2023-09-08 NOTE — Progress Notes (Signed)
09/08/2023 Jacob MCCLENDON [id number] [birth year]   HISTORY OF PRESENT ILLNESS:  This is a 67 year old male with a past medical history as listed below including Barrett's esophagus, chronic constipation, GERD, adenomatous polyps, history of SBO related to adhesions, and multiple others, known to Dr. Marina Goodell.  Was hospitalized again in August at [facility] for small bowel obstruction.  This was seen on the CT scan, but then at the end of his hospitalization a small bowel series was unremarkable.  He has not had any issues since that time.  He is taking Linzess 290 mcg daily for his constipation, which helps and moves his bowels well.  Takes his Prevacid twice daily for his acid reflux and Barrett's esophagus.  Has chronic right right-sided abdominal pain.  CT scan of the abdomen and pelvis with contrast 05/2023 at [facility]:  1.  Small bowel obstruction with transition point suspected in the left abdomen.  2.  Nonspecific colitis extending from the distal transverse colon through the sigmoid colon.   Small bowel series 05/2023 at [facility]:  IMPRESSION:   Unremarkable small bowel follow-through, particularly without evidence of obstruction.   12/22/2018 EGD with Barrett's esophagus.  Repeat recommended in 5 years.  01/25/2020 colonoscopy with four 1-2 mm polyps in descending and ascending colon and diverticulosis.  Pathology showed adenomas and repeat recommended in 3 years.   Past Medical History:  Diagnosis Date   Anxiety    prn xanax started 01/2019->self d/c'd in fall/winter 2020   Barrett's esophagus    Bx+ 2007, 2009, and 2020.   Chronic constipation    GI->Linzess trial 05/2019->helpful   Contact dermatitis and other eczema, due to unspecified cause    +scarring   Diverticulosis 2021   noted on CT abd 06/2020   Epididymal cyst 06/2018   Bilat: small on R, but 4 cm on left  Asymptomatic.   GERD (gastroesophageal reflux disease)    History of adenomatous polyp of colon 08/10/2006; 01/2020    2007 and 12/2018; 01/2020, recall 2024   Hyperlipidemia    Incisional hernia    Left foot pain    ? Arch strain.  Some numbness to suggest possible tarsal tunnel syndrome vs lumbar radiculitis (sports med MD).  Responded very well to custom orthotic.   Migraine syndrome    verapamil prophylaxis, cluster migraine   Pseudophakia of both eyes    Right wrist pain    04/2023 Sprain, scapholunate widening on f/u xray 06/2023-->ref ortho   Seasonal and perennial allergic rhinitis    vaccine/injection therapy helpful   Small bowel obstruction (HCC) Fall of 2019   surgery->lysis of adhesions   Vertebral compression fracture (HCC) 05/2019   T11 and T12 and L 1, minimal trauma.  Dr. Karie Schwalbe put him on fosamax.   Past Surgical History:  Procedure Laterality Date   ABDOMINAL EXPLORATION SURGERY  Fall 2019   SBO->lysis of adhesions   COLONOSCOPY W/ POLYPECTOMY  08/10/06; 12/2018   Several adenomatous polyps 2007 and 2020; 01-25-2020. recall 2024   DEXA  05/24/2019   Dr. Toma Copier -2.4 + vert comp fx->fosamax started.  06/2021 T score -1.8   ESOPHAGOGASTRODUODENOSCOPY  2007 and 07/20/08;12/2018   GERD, BARRET's esoph (Dr. Marina Goodell). Same 12/2018.   INCISIONAL HERNIA REPAIR N/A 02/08/2020   Procedure: LAPAROSCOPIC INCISIONAL HERNIA REPAIR WITH MESH;  Surgeon: Kinsinger, De Blanch, MD;  Location: [facility];  Service: General;  Laterality: N/A;   INTRAOCULAR LENS INSERTION  09/03/2020   OU  LE venous doppler u/s  04/19/2015   L leg NORMAL   right knee arthroscopy  1980s   Torn meniscus (Dr. Thurston Hole)    reports that he quit smoking about 30 years ago. His smoking use included cigarettes. He started smoking about 40 years ago. He has a 10 pack-year smoking history. He has never used smokeless tobacco. He reports current alcohol use. He reports that he does not use drugs. family history includes Healthy in his brother; Other in his father. Allergies  Allergen Reactions   Oxycodone     nausea       Outpatient Encounter Medications as of 09/08/2023  Medication Sig   alendronate (FOSAMAX) 70 MG tablet TAKE 1 TABLET (70 MG TOTAL) BY MOUTH EVERY 7 DAYS WITH FULL GLASS WATER ON EMPTY STOMACH   atorvastatin (LIPITOR) 40 MG tablet Take 1 tablet (40 mg total) by mouth daily.   buPROPion (WELLBUTRIN XL) 150 MG 24 hr tablet Take 1 tablet (150 mg total) by mouth daily.   Calcium Carbonate-Vitamin D3 (CALCIUM + VITAMIN D3) 600-400 MG-UNIT TABS Take 1 tablet by mouth 2 (two) times daily.   cyclobenzaprine (FLEXERIL) 10 MG tablet TAKE 1 TABLET BY MOUTH TWICE A DAY AS NEEDED   dicyclomine (BENTYL) 10 MG capsule TAKE 1 CAPSULE BY MOUTH TWICE A DAY   ibuprofen (ADVIL) 200 MG tablet Take 4 tablets (800 mg total) by mouth every 8 (eight) hours as needed.   lansoprazole (PREVACID) 30 MG capsule Take 1 capsule (30 mg total) by mouth 2 (two) times daily.   Na Sulfate-K Sulfate-Mg Sulf 17.5-3.13-1.6 GM/177ML SOLN Take 1 kit by mouth once for 1 dose.   polyethylene glycol powder (GLYCOLAX/MIRALAX) 17 GM/SCOOP powder Take by mouth.   verapamil (CALAN) 120 MG tablet Take 60 mg by mouth once. For migraines   [DISCONTINUED] fluticasone (FLONASE) 50 MCG/ACT nasal spray SPRAY 2 SPRAYS INTO EACH NOSTRIL EVERY DAY   [DISCONTINUED] linaclotide (LINZESS) 290 MCG CAPS capsule TAKE 1 CAPSULE BY MOUTH DAILY BEFORE BREAKFAST.   linaclotide (LINZESS) 290 MCG CAPS capsule TAKE 1 CAPSULE BY MOUTH DAILY BEFORE BREAKFAST.   No facility-administered encounter medications on file as of 09/08/2023.    REVIEW OF SYSTEMS  : All other systems reviewed and negative except where noted in the History of Present Illness.   PHYSICAL EXAM: BP 122/74   Pulse 86   Ht 5\' 10"  (1.778 m)   Wt 148 lb (67.1 kg)   BMI 21.24 kg/m  General: Well developed white male in no acute distress Head: Normocephalic and atraumatic Eyes:  Sclerae anicteric, conjunctiva pink. Ears: Normal auditory acuity Lungs: Clear throughout to auscultation;  no W/R/R. Heart: Regular rate and rhythm; no M/R/G. Abdomen: Soft, non-distended.  BS present.  Minimal right sided TTP. Rectal:  Will be done at the time of colonoscopy. Musculoskeletal: Symmetrical with no gross deformities  Skin: No lesions on visible extremities Extremities: No edema  Neurological: Alert oriented x 4, grossly non-focal Psychological:  Alert and cooperative. Normal mood and affect  ASSESSMENT AND PLAN: *GERD and history of Barrett's esophagus: Last EGD in March 2020 with a repeat recommended in 5 years.  Will schedule that out into next year with Dr. Marina Goodell.  Continue Prevacid twice daily. *History of adenomatous colon polyps: Last colonoscopy April 2021 with repeat recommended in 3 years.  Is overdue for that.  Will schedule with Dr. Marina Goodell. *Right side of abdominal pain: This is chronic.  Question if it is due to previous mesh placement, question musculoskeletal,  question chronic abdominal pain related to adhesions. *History of small bowel obstruction related to adhesions requiring lysis of adhesions in the past.  Unfortunately had another occurrence in August of this year and was hospitalized at [facility].  No issues since then. *Chronic constipation: Well-controlled on Linzess 290 mcg daily.  Will continue this.  Prescription refilled.  **The risks, benefits, and alternatives to EGD and colonoscopy were discussed with the patient and he consents to proceed.   CC:  McGowen, Maryjean Morn, MD

## 2023-09-08 NOTE — Progress Notes (Signed)
Noted  

## 2023-09-08 NOTE — Patient Instructions (Signed)
We have sent the following medications to your pharmacy for you to pick up at your convenience: Linzess 290 mcg daily before breakfast.   You have been scheduled for an endoscopy and colonoscopy. Please follow the written instructions given to you at your visit today.  Please pick up your prep supplies at the pharmacy within the next 1-3 days.  If you use inhalers (even only as needed), please bring them with you on the day of your procedure.  DO NOT TAKE 7 DAYS PRIOR TO TEST- Trulicity (dulaglutide) Ozempic, Wegovy (semaglutide) Mounjaro (tirzepatide) Bydureon Bcise (exanatide extended release)  DO NOT TAKE 1 DAY PRIOR TO YOUR TEST Rybelsus (semaglutide) Adlyxin (lixisenatide) Victoza (liraglutide) Byetta (exanatide) ____________________________________________________________________  _______________________________________________________  If your blood pressure at your visit was 140/90 or greater, please contact your primary care physician to follow up on this.  _______________________________________________________  If you are age 95 or older, your body mass index should be between 23-30. Your Body mass index is 21.24 kg/m. If this is out of the aforementioned range listed, please consider follow up with your Primary Care Provider.  If you are age 87 or younger, your body mass index should be between 19-25. Your Body mass index is 21.24 kg/m. If this is out of the aformentioned range listed, please consider follow up with your Primary Care Provider.   ________________________________________________________  The [facility] providers would like to encourage you to use [facility] to communicate with providers for non-urgent requests or questions.  Due to long hold times on the telephone, sending your provider a message by [facility] may be a faster and more efficient way to get a response.  Please allow 48 business hours for a response.  Please remember that this is for non-urgent  requests.  _______________________________________________________

## 2023-09-23 DIAGNOSIS — M9902 Segmental and somatic dysfunction of thoracic region: Secondary | ICD-10-CM | POA: Diagnosis not present

## 2023-09-23 DIAGNOSIS — M9901 Segmental and somatic dysfunction of cervical region: Secondary | ICD-10-CM | POA: Diagnosis not present

## 2023-09-23 DIAGNOSIS — M50323 Other cervical disc degeneration at C6-C7 level: Secondary | ICD-10-CM | POA: Diagnosis not present

## 2023-09-23 DIAGNOSIS — M5134 Other intervertebral disc degeneration, thoracic region: Secondary | ICD-10-CM | POA: Diagnosis not present

## 2023-10-21 DIAGNOSIS — M9902 Segmental and somatic dysfunction of thoracic region: Secondary | ICD-10-CM | POA: Diagnosis not present

## 2023-10-21 DIAGNOSIS — M50323 Other cervical disc degeneration at C6-C7 level: Secondary | ICD-10-CM | POA: Diagnosis not present

## 2023-10-21 DIAGNOSIS — M5134 Other intervertebral disc degeneration, thoracic region: Secondary | ICD-10-CM | POA: Diagnosis not present

## 2023-10-21 DIAGNOSIS — M9901 Segmental and somatic dysfunction of cervical region: Secondary | ICD-10-CM | POA: Diagnosis not present

## 2023-10-27 ENCOUNTER — Encounter: Payer: Self-pay | Admitting: Internal Medicine

## 2023-11-03 ENCOUNTER — Ambulatory Visit: Payer: BC Managed Care – PPO | Admitting: Internal Medicine

## 2023-11-03 ENCOUNTER — Encounter: Payer: Self-pay | Admitting: Internal Medicine

## 2023-11-03 VITALS — BP 119/87 | HR 101 | Temp 97.3°F | Resp 13 | Ht 70.0 in | Wt 148.0 lb

## 2023-11-03 DIAGNOSIS — Z13810 Encounter for screening for upper gastrointestinal disorder: Secondary | ICD-10-CM | POA: Diagnosis not present

## 2023-11-03 DIAGNOSIS — K317 Polyp of stomach and duodenum: Secondary | ICD-10-CM | POA: Diagnosis not present

## 2023-11-03 DIAGNOSIS — D122 Benign neoplasm of ascending colon: Secondary | ICD-10-CM

## 2023-11-03 DIAGNOSIS — K219 Gastro-esophageal reflux disease without esophagitis: Secondary | ICD-10-CM | POA: Diagnosis not present

## 2023-11-03 DIAGNOSIS — K2270 Barrett's esophagus without dysplasia: Secondary | ICD-10-CM

## 2023-11-03 DIAGNOSIS — K5730 Diverticulosis of large intestine without perforation or abscess without bleeding: Secondary | ICD-10-CM

## 2023-11-03 DIAGNOSIS — Z1211 Encounter for screening for malignant neoplasm of colon: Secondary | ICD-10-CM

## 2023-11-03 DIAGNOSIS — K449 Diaphragmatic hernia without obstruction or gangrene: Secondary | ICD-10-CM

## 2023-11-03 DIAGNOSIS — Z8719 Personal history of other diseases of the digestive system: Secondary | ICD-10-CM | POA: Diagnosis not present

## 2023-11-03 DIAGNOSIS — Z860101 Personal history of adenomatous and serrated colon polyps: Secondary | ICD-10-CM

## 2023-11-03 MED ORDER — SODIUM CHLORIDE 0.9 % IV SOLN: 500.0000 mL | Freq: Once | INTRAVENOUS | Status: DC

## 2023-11-03 NOTE — Progress Notes (Signed)
Called to room to assist during endoscopic procedure.  Patient ID and intended procedure confirmed with present staff. Received instructions for my participation in the procedure from the performing physician.

## 2023-11-03 NOTE — Progress Notes (Signed)
Expand All Collapse All        09/08/2023 Jacob Blanchard [id number] 05-29-1956     HISTORY OF PRESENT ILLNESS:  This is a 68 year old male with a past medical history as listed below including Barrett's esophagus, chronic constipation, GERD, adenomatous polyps, history of SBO related to adhesions, and multiple others, known to Dr. Marina Goodell.  Was hospitalized again in August at [facility] for small bowel obstruction.  This was seen on the CT scan, but then at the end of his hospitalization a small bowel series was unremarkable.  He has not had any issues since that time.  He is taking Linzess 290 mcg daily for his constipation, which helps and moves his bowels well.  Takes his Prevacid twice daily for his acid reflux and Barrett's esophagus.  Has chronic right right-sided abdominal pain.   CT scan of the abdomen and pelvis with contrast 05/2023 at [facility]:   1.  Small bowel obstruction with transition point suspected in the left abdomen.  2.  Nonspecific colitis extending from the distal transverse colon through the sigmoid colon.    Small bowel series 05/2023 at [facility]:   IMPRESSION:   Unremarkable small bowel follow-through, particularly without evidence of obstruction.    12/22/2018 EGD with Barrett's esophagus.  Repeat recommended in 5 years.   01/25/2020 colonoscopy with four 1-2 mm polyps in descending and ascending colon and diverticulosis.  Pathology showed adenomas and repeat recommended in 3 years.         Past Medical History:  Diagnosis Date   Anxiety      prn xanax started 01/2019->self d/c'd in fall/winter 2020   Barrett's esophagus      Bx+ 2007, 2009, and 2020.   Chronic constipation      GI->Linzess trial 05/2019->helpful   Contact dermatitis and other eczema, due to unspecified cause      +scarring   Diverticulosis 2021    noted on CT abd 06/2020   Epididymal cyst 06/2018    Bilat: small on R, but 4 cm on left  Asymptomatic.   GERD (gastroesophageal reflux disease)      History of adenomatous polyp of colon 08/10/2006; 01/2020    2007 and 12/2018; 01/2020, recall 2024   Hyperlipidemia     Incisional hernia     Left foot pain      ? Arch strain.  Some numbness to suggest possible tarsal tunnel syndrome vs lumbar radiculitis (sports med MD).  Responded very well to custom orthotic.   Migraine syndrome      verapamil prophylaxis, cluster migraine   Pseudophakia of both eyes     Right wrist pain      04/2023 Sprain, scapholunate widening on f/u xray 06/2023-->ref ortho   Seasonal and perennial allergic rhinitis      vaccine/injection therapy helpful   Small bowel obstruction (HCC) Fall of 2019    surgery->lysis of adhesions   Vertebral compression fracture (HCC) 05/2019    T11 and T12 and L 1, minimal trauma.  Dr. Karie Schwalbe put him on fosamax.             Past Surgical History:  Procedure Laterality Date   ABDOMINAL EXPLORATION SURGERY   Fall 2019    SBO->lysis of adhesions   COLONOSCOPY W/ POLYPECTOMY   08/10/06; 12/2018    Several adenomatous polyps 2007 and 2020; 01-25-2020. recall 2024   DEXA   05/24/2019    Dr. Toma Copier -2.4 + vert comp fx->fosamax started.  06/2021 T score -  1.8   ESOPHAGOGASTRODUODENOSCOPY   2007 and 07/20/08;12/2018    GERD, BARRET's esoph (Dr. Marina Goodell). Same 12/2018.   INCISIONAL HERNIA REPAIR N/A 02/08/2020    Procedure: LAPAROSCOPIC INCISIONAL HERNIA REPAIR WITH MESH;  Surgeon: Kinsinger, De Blanch, MD;  Location: [facility];  Service: General;  Laterality: N/A;   INTRAOCULAR LENS INSERTION   09/03/2020    OU   LE venous doppler u/s   04/19/2015    L leg NORMAL   right knee arthroscopy   1980s    Torn meniscus (Dr. Thurston Hole)         reports that he quit smoking about 30 years ago. His smoking use included cigarettes. He started smoking about 40 years ago. He has a 10 pack-year smoking history. He has never used smokeless tobacco. He reports current alcohol use. He reports that he does not use drugs. family history  includes Healthy in his brother; Other in his father. Allergies       Allergies  Allergen Reactions   Oxycodone        nausea              Outpatient Encounter Medications as of 09/08/2023  Medication Sig   alendronate (FOSAMAX) 70 MG tablet TAKE 1 TABLET (70 MG TOTAL) BY MOUTH EVERY 7 DAYS WITH FULL GLASS WATER ON EMPTY STOMACH   atorvastatin (LIPITOR) 40 MG tablet Take 1 tablet (40 mg total) by mouth daily.   buPROPion (WELLBUTRIN XL) 150 MG 24 hr tablet Take 1 tablet (150 mg total) by mouth daily.   Calcium Carbonate-Vitamin D3 (CALCIUM + VITAMIN D3) 600-400 MG-UNIT TABS Take 1 tablet by mouth 2 (two) times daily.   cyclobenzaprine (FLEXERIL) 10 MG tablet TAKE 1 TABLET BY MOUTH TWICE A DAY AS NEEDED   dicyclomine (BENTYL) 10 MG capsule TAKE 1 CAPSULE BY MOUTH TWICE A DAY   ibuprofen (ADVIL) 200 MG tablet Take 4 tablets (800 mg total) by mouth every 8 (eight) hours as needed.   lansoprazole (PREVACID) 30 MG capsule Take 1 capsule (30 mg total) by mouth 2 (two) times daily.   Na Sulfate-K Sulfate-Mg Sulf 17.5-3.13-1.6 GM/177ML SOLN Take 1 kit by mouth once for 1 dose.   polyethylene glycol powder (GLYCOLAX/MIRALAX) 17 GM/SCOOP powder Take by mouth.   verapamil (CALAN) 120 MG tablet Take 60 mg by mouth once. For migraines   [DISCONTINUED] fluticasone (FLONASE) 50 MCG/ACT nasal spray SPRAY 2 SPRAYS INTO EACH NOSTRIL EVERY DAY   [DISCONTINUED] linaclotide (LINZESS) 290 MCG CAPS capsule TAKE 1 CAPSULE BY MOUTH DAILY BEFORE BREAKFAST.   linaclotide (LINZESS) 290 MCG CAPS capsule TAKE 1 CAPSULE BY MOUTH DAILY BEFORE BREAKFAST.      No facility-administered encounter medications on file as of 09/08/2023.        REVIEW OF SYSTEMS  : All other systems reviewed and negative except where noted in the History of Present Illness.     PHYSICAL EXAM: BP 122/74   Pulse 86   Ht 5\' 10"  (1.778 m)   Wt 148 lb (67.1 kg)   BMI 21.24 kg/m  General: Well developed white male in no acute  distress Head: Normocephalic and atraumatic Eyes:  Sclerae anicteric, conjunctiva pink. Ears: Normal auditory acuity Lungs: Clear throughout to auscultation; no W/R/R. Heart: Regular rate and rhythm; no M/R/G. Abdomen: Soft, non-distended.  BS present.  Minimal right sided TTP. Rectal:  Will be done at the time of colonoscopy. Musculoskeletal: Symmetrical with no gross deformities  Skin: No lesions on visible extremities  Extremities: No edema  Neurological: Alert oriented x 4, grossly non-focal Psychological:  Alert and cooperative. Normal mood and affect   ASSESSMENT AND PLAN: *GERD and history of Barrett's esophagus: Last EGD in March 2020 with a repeat recommended in 5 years.  Will schedule that out into next year with Dr. Marina Goodell.  Continue Prevacid twice daily. *History of adenomatous colon polyps: Last colonoscopy April 2021 with repeat recommended in 3 years.  Is overdue for that.  Will schedule with Dr. Marina Goodell. *Right side of abdominal pain: This is chronic.  Question if it is due to previous mesh placement, question musculoskeletal, question chronic abdominal pain related to adhesions. *History of small bowel obstruction related to adhesions requiring lysis of adhesions in the past.  Unfortunately had another occurrence in August of this year and was hospitalized at [facility].  No issues since then. *Chronic constipation: Well-controlled on Linzess 290 mcg daily.  Will continue this.  Prescription refilled.   **The risks, benefits, and alternatives to EGD and colonoscopy were discussed with the patient and he consents to proceed.    Recent H&P as above.  No interval change.  Now for colonoscopy and upper endoscopy.

## 2023-11-03 NOTE — Progress Notes (Signed)
Sedate, gd SR, tolerated procedure well, VSS, report to RN

## 2023-11-03 NOTE — Op Note (Signed)
[facility] Patient Name: Jacob Blanchard Procedure Date: 11/03/2023 10:07 AM MRN: [id number] Endoscopist: Wilhemina Bonito. Marina Goodell , MD, [id number] Age: 68 Referring MD:  Date of Birth: [birth year] Gender: Male Account #: [id number] Procedure:                Colonoscopy with cold snare polypectomy x 1 Indications:              High risk colon cancer surveillance: Personal                            history of adenoma (10 mm or greater in size), High                            risk colon cancer surveillance: Personal history of                            multiple (3 or more) adenomas. Previous                            examinations 2007, 2020, 2021 Medicines:                Monitored Anesthesia Care Procedure:                Pre-Anesthesia Assessment:                           - Prior to the procedure, a History and Physical                            was performed, and patient medications and                            allergies were reviewed. The patient's tolerance of                            previous anesthesia was also reviewed. The risks                            and benefits of the procedure and the sedation                            options and risks were discussed with the patient.                            All questions were answered, and informed consent                            was obtained. Prior Anticoagulants: The patient has                            taken no anticoagulant or antiplatelet agents. ASA                            Grade Assessment: II - A patient with mild systemic  disease. After reviewing the risks and benefits,                            the patient was deemed in satisfactory condition to                            undergo the procedure.                           After obtaining informed consent, the colonoscope                            was passed under direct vision. Throughout the                            procedure,  the patient's blood pressure, pulse, and                            oxygen saturations were monitored continuously. The                            CF [id number] #[id number] was introduced through the anus                            and advanced to the the cecum, identified by                            appendiceal orifice and ileocecal valve. The                            ileocecal valve, appendiceal orifice, and rectum                            were photographed. The quality of the bowel                            preparation was excellent. The colonoscopy was                            performed without difficulty. The patient tolerated                            the procedure well. The bowel preparation used was                            SUPREP via split dose instruction. Scope In: 10:25:27 AM Scope Out: 10:41:53 AM Scope Withdrawal Time: 0 hours 11 minutes 37 seconds  Total Procedure Duration: 0 hours 16 minutes 26 seconds  Findings:                 A 3 mm polyp was found in the ascending colon. The                            polyp was removed with a cold snare. Resection  and                            retrieval were complete.                           Many diverticula were found in the sigmoid colon.                           The exam was otherwise without abnormality on                            direct and retroflexion views. Complications:            No immediate complications. Estimated blood loss:                            None. Estimated Blood Loss:     Estimated blood loss: none. Impression:               - One 3 mm polyp in the ascending colon, removed                            with a cold snare. Resected and retrieved.                           - Diverticulosis in the sigmoid colon.                           - The examination was otherwise normal on direct                            and retroflexion views (personal history of                            multiple and advanced  adenomas). Recommendation:           - Repeat colonoscopy in 5 years for surveillance.                           - Patient has a contact number available for                            emergencies. The signs and symptoms of potential                            delayed complications were discussed with the                            patient. Return to normal activities tomorrow.                            Written discharge instructions were provided to the                            patient.                           -  Resume previous diet.                           - Continue present medications.                           - Await pathology results. Wilhemina Bonito. Marina Goodell, MD 11/03/2023 10:47:56 AM This report has been signed electronically.

## 2023-11-03 NOTE — Patient Instructions (Signed)
Please read handouts provided. Continue present medications. Await pathology results. Resume previous diet. Repeat colonoscopy in 5 years for screening.   YOU HAD AN ENDOSCOPIC PROCEDURE TODAY AT THE [facility]:   Refer to the procedure report that was given to you for any specific questions about what was found during the examination.  If the procedure report does not answer your questions, please call your gastroenterologist to clarify.  If you requested that your care partner not be given the details of your procedure findings, then the procedure report has been included in a sealed envelope for you to review at your convenience later.  YOU SHOULD EXPECT: Some feelings of bloating in the abdomen. Passage of more gas than usual.  Walking can help get rid of the air that was put into your GI tract during the procedure and reduce the bloating. If you had a lower endoscopy (such as a colonoscopy or flexible sigmoidoscopy) you may notice spotting of blood in your stool or on the toilet paper. If you underwent a bowel prep for your procedure, you may not have a normal bowel movement for a few days.  Please Note:  You might notice some irritation and congestion in your nose or some drainage.  This is from the oxygen used during your procedure.  There is no need for concern and it should clear up in a day or so.  SYMPTOMS TO REPORT IMMEDIATELY:  Following lower endoscopy (colonoscopy or flexible sigmoidoscopy):  Excessive amounts of blood in the stool  Significant tenderness or worsening of abdominal pains  Swelling of the abdomen that is new, acute  Fever of 100F or higher  Following upper endoscopy (EGD)  Vomiting of blood or coffee ground material  New chest pain or pain under the shoulder blades  Painful or persistently difficult swallowing  New shortness of breath  Fever of 100F or higher  Black, tarry-looking stools  For urgent or emergent issues, a gastroenterologist  can be reached at any hour by calling [phone]. Do not use MyChart messaging for urgent concerns.    DIET:  We do recommend a small meal at first, but then you may proceed to your regular diet.  Drink plenty of fluids but you should avoid alcoholic beverages for 24 hours.  ACTIVITY:  You should plan to take it easy for the rest of today and you should NOT DRIVE or use heavy machinery until tomorrow (because of the sedation medicines used during the test).    FOLLOW UP: Our staff will call the number listed on your records the next business day following your procedure.  We will call around 7:15- 8:00 am to check on you and address any questions or concerns that you may have regarding the information given to you following your procedure. If we do not reach you, we will leave a message.     If any biopsies were taken you will be contacted by phone or by letter within the next 1-3 weeks.  Please call us at [phone] if you have not heard about the biopsies in 3 weeks.    SIGNATURES/CONFIDENTIALITY: You and/or your care partner have signed paperwork which will be entered into your electronic medical record.  These signatures attest to the fact that that the information above on your After Visit Summary has been reviewed and is understood.  Full responsibility of the confidentiality of this discharge information lies with you and/or your care-partner.

## 2023-11-03 NOTE — Progress Notes (Signed)
Pt's states no medical or surgical changes since previsit or office visit.

## 2023-11-03 NOTE — Op Note (Signed)
[facility] Patient Name: Jacob Blanchard Procedure Date: 11/03/2023 10:07 AM MRN: [id number] Endoscopist: Wilhemina Bonito. Marina Goodell , MD, [id number] Age: 68 Referring MD:  Date of Birth: Aug 04, 1956 Gender: Male Account #: [id number] Procedure:                Upper GI endoscopy with biopsies Indications:              Surveillance for malignancy due to personal history                            of Barrett's esophagus Medicines:                Monitored Anesthesia Care Procedure:                Pre-Anesthesia Assessment:                           - Prior to the procedure, a History and Physical                            was performed, and patient medications and                            allergies were reviewed. The patient's tolerance of                            previous anesthesia was also reviewed. The risks                            and benefits of the procedure and the sedation                            options and risks were discussed with the patient.                            All questions were answered, and informed consent                            was obtained. Prior Anticoagulants: The patient has                            taken no anticoagulant or antiplatelet agents. ASA                            Grade Assessment: II - A patient with mild systemic                            disease. After reviewing the risks and benefits,                            the patient was deemed in satisfactory condition to                            undergo the procedure.  After obtaining informed consent, the endoscope was                            passed under direct vision. Throughout the                            procedure, the patient's blood pressure, pulse, and                            oxygen saturations were monitored continuously. The                            Olympus Scope [id number] was introduced through the                            mouth, and  advanced to the second part of duodenum.                            The upper GI endoscopy was accomplished without                            difficulty. The patient tolerated the procedure                            well. Scope In: Scope Out: Findings:                 The esophagus revealed a large caliber ringlike                            stricture at the gastroesophageal junction. There                            was 1 diminutive island of Barrett's type mucosa.                            Biopsies were taken with a cold forceps for                            histology. See images                           The stomach revealed a small hiatal hernia and a                            few benign fundic gland polyps.                           The examined duodenum was normal.                           The cardia and gastric fundus were normal on                            retroflexion. Complications:  No immediate complications. Estimated Blood Loss:     Estimated blood loss: none. Impression:               1. Ultrashort segment Barrett's. Status post                            biopsies                           2. Small hiatal hernia, otherwise unremarkable EGD. Recommendation:           - Patient has a contact number available for                            emergencies. The signs and symptoms of potential                            delayed complications were discussed with the                            patient. Return to normal activities tomorrow.                            Written discharge instructions were provided to the                            patient.                           - Resume previous diet.                           - Continue present medications.                           - Await pathology results.                           - No follow-up surveillance EGD recommended if no                            evidence of dysplasia on biopsies Jacob Blanchard N. Marina Goodell,  MD 11/03/2023 10:58:59 AM This report has been signed electronically.

## 2023-11-04 ENCOUNTER — Telehealth: Payer: Self-pay

## 2023-11-04 NOTE — Telephone Encounter (Signed)
Attempted f/u call. No answer, left VM.

## 2023-11-05 ENCOUNTER — Encounter: Payer: Self-pay | Admitting: Internal Medicine

## 2023-11-05 LAB — SURGICAL PATHOLOGY

## 2023-11-25 DIAGNOSIS — M50323 Other cervical disc degeneration at C6-C7 level: Secondary | ICD-10-CM | POA: Diagnosis not present

## 2023-11-25 DIAGNOSIS — M9901 Segmental and somatic dysfunction of cervical region: Secondary | ICD-10-CM | POA: Diagnosis not present

## 2023-11-25 DIAGNOSIS — M5134 Other intervertebral disc degeneration, thoracic region: Secondary | ICD-10-CM | POA: Diagnosis not present

## 2023-11-25 DIAGNOSIS — M9902 Segmental and somatic dysfunction of thoracic region: Secondary | ICD-10-CM | POA: Diagnosis not present

## 2023-11-26 ENCOUNTER — Encounter: Payer: BC Managed Care – PPO | Admitting: Family Medicine

## 2023-12-06 ENCOUNTER — Other Ambulatory Visit: Payer: Self-pay | Admitting: Gastroenterology

## 2023-12-06 DIAGNOSIS — G44009 Cluster headache syndrome, unspecified, not intractable: Secondary | ICD-10-CM | POA: Diagnosis not present

## 2023-12-27 ENCOUNTER — Encounter: Payer: Self-pay | Admitting: Family Medicine

## 2023-12-27 ENCOUNTER — Other Ambulatory Visit: Payer: Self-pay | Admitting: Family Medicine

## 2023-12-27 NOTE — Progress Notes (Unsigned)
 Office Note 12/28/2023  CC:  Chief Complaint  Patient presents with   Annual Exam    Pt is fasting   Patient is a 68 y.o. male who is here for annual health maintenance exam and f/u HLD.  HPI: Jacob Blanchard feels well. Blood pressures at home consistently normal.  Has had some hand itching and dermatitis lately.  His wife was in the hospital for ruptured appendix for 9 days and he used the cleansers at the hospital on his hands regularly.  Right wrist pain was evaluated by orthopedist since I last saw him, diagnosis sprain.  It continues to gradually get better.  Past Medical History:  Diagnosis Date   Anxiety    prn xanax started 01/2019->self d/c'd in fall/winter 2020   Barrett's esophagus    Bx+ 2007, 2009, and 2020.   Chronic constipation    GI->Linzess trial 05/2019->helpful   Contact dermatitis and other eczema, due to unspecified cause    +scarring   Diverticulosis 2021   noted on CT abd 06/2020   Epididymal cyst 06/2018   Bilat: small on R, but 4 cm on left  Asymptomatic.   GERD (gastroesophageal reflux disease)    History of adenomatous polyp of colon 08/10/2006; 01/2020   2007 and 12/2018; 01/2020, 10/2023, recall 2030.   Hyperlipidemia    Incisional hernia    Left foot pain    ? Arch strain.  Some numbness to suggest possible tarsal tunnel syndrome vs lumbar radiculitis (sports med MD).  Responded very well to custom orthotic.   Migraine syndrome    verapamil prophylaxis, cluster migraine   Osteoporosis    fosamax started 2022   Pseudophakia of both eyes    Right wrist pain    04/2023 Sprain, scapholunate widening on f/u xray 06/2023-->ref ortho   Seasonal and perennial allergic rhinitis    vaccine/injection therapy helpful   Small bowel obstruction (HCC) Fall of 2019   surgery->lysis of adhesions   Vertebral compression fracture (HCC) 05/2019   T11 and T12 and L 1, minimal trauma.  Dr. Karie Schwalbe put him on fosamax.    Past Surgical History:  Procedure Laterality Date    ABDOMINAL EXPLORATION SURGERY  Fall 2019   SBO->lysis of adhesions   COLONOSCOPY W/ POLYPECTOMY  08/10/06; 12/2018   Several adenomatous polyps 2007 and 2020; 01-25-2020. Jan 2025 adenoma-->recall 2030.   DEXA  05/24/2019   Dr. Toma Copier -2.4 + vert comp fx->fosamax started.  06/2021 T score -1.8   ESOPHAGOGASTRODUODENOSCOPY  2007 and 07/20/08;12/2018   GERD, BARRET's esoph (Dr. Marina Goodell). Same 12/2018. Same 10/2023.  No further f/u EGD needed per GI.   INCISIONAL HERNIA REPAIR N/A 02/08/2020   Procedure: LAPAROSCOPIC INCISIONAL HERNIA REPAIR WITH MESH;  Surgeon: Kinsinger, De Blanch, MD;  Location: [facility];  Service: General;  Laterality: N/A;   INTRAOCULAR LENS INSERTION  09/03/2020   OU   LE venous doppler u/s  04/19/2015   L leg NORMAL   right knee arthroscopy  1980s   Torn meniscus (Dr. Thurston Hole)    Family History  Problem Relation Age of Onset   Other Father        chemical PNA siphoning gas   Healthy Brother    Colon cancer Neg Hx    Esophageal cancer Neg Hx    Rectal cancer Neg Hx    Stomach cancer Neg Hx     Social History   Socioeconomic History   Marital status: Married    Spouse name: Not on file  Number of children: 1   Years of education: Not on file   Highest education level: Not on file  Occupational History   Occupation: International aid/development worker product-sells    Employer: [employer]  Tobacco Use   Smoking status: Former    Current packs/day: 0.00    Average packs/day: 1 pack/day for 10.0 years (10.0 ttl pk-yrs)    Types: Cigarettes    Start date: 07/07/1983    Quit date: 07/06/1993    Years since quitting: 30.4   Smokeless tobacco: Never  Vaping Use   Vaping status: Never Used  Substance and Sexual Activity   Alcohol use: Yes    Comment: 1 or 2 daily   Drug use: No   Sexual activity: Not on file  Other Topics Concern   Not on file  Social History Narrative   Married, one daughter.   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    No tobacco, 2 beers a night, no hx of problem drinking/drug use.   Caffeine: 2-3 cups coffee every morning.  Tea x 2 later in day.   Social Drivers of Corporate investment banker Strain: Not on file  Food Insecurity: No Food Insecurity (05/30/2023)   Received from [facility]   Hunger Vital Sign    Worried About Running Out of Food in the Last Year: Never true    Ran Out of Food in the Last Year: Never true  Transportation Needs: No Transportation Needs (05/30/2023)   Received from [facility] - Transportation    Lack of Transportation (Medical): No    Lack of Transportation (Non-Medical): No  Physical Activity: Not on file  Stress: No Stress Concern Present (05/30/2023)   Received from [facility] - Occupational Stress Questionnaire    Feeling of Stress : Not at all  Social Connections: Unknown (02/12/2022)   Received from [facility]   Social Network    Social Network: Not on file  Intimate Partner Violence: Not At Risk (05/30/2023)   Received from [facility]   HITS    Over the last 12 months how often did your partner physically hurt you?: Never    Over the last 12 months how often did your partner insult you or talk down to you?: Never    Over the last 12 months how often did your partner threaten you with physical harm?: Never    Over the last 12 months how often did your partner scream or curse at you?: Never    Outpatient Medications Prior to Visit  Medication Sig Dispense Refill   alendronate (FOSAMAX) 70 MG tablet TAKE 1 TABLET (70 MG TOTAL) BY MOUTH EVERY 7 DAYS WITH FULL GLASS WATER ON EMPTY STOMACH 12 tablet 3   Calcium Carbonate-Vitamin D3 (CALCIUM + VITAMIN D3) 600-400 MG-UNIT TABS Take 1 tablet by mouth 2 (two) times daily. 180 tablet 3   cyclobenzaprine (FLEXERIL) 10 MG tablet TAKE 1 TABLET BY MOUTH TWICE A DAY AS NEEDED 30 tablet 1   dicyclomine (BENTYL) 10 MG  capsule TAKE 1 CAPSULE BY MOUTH TWICE A DAY 180 capsule 1   ibuprofen (ADVIL) 200 MG tablet Take 4 tablets (800 mg total) by mouth every 8 (eight) hours as needed.     linaclotide (LINZESS) 290 MCG CAPS capsule TAKE 1 CAPSULE BY MOUTH DAILY BEFORE BREAKFAST. 90 capsule 3   polyethylene glycol powder (GLYCOLAX/MIRALAX) 17 GM/SCOOP powder  Take by mouth.     verapamil (CALAN) 120 MG tablet Take 60 mg by mouth once. For migraines     atorvastatin (LIPITOR) 40 MG tablet Take 1 tablet (40 mg total) by mouth daily. 90 tablet 1   buPROPion (WELLBUTRIN XL) 150 MG 24 hr tablet Take 1 tablet (150 mg total) by mouth daily. 90 tablet 1   fluticasone (FLONASE) 50 MCG/ACT nasal spray SPRAY 2 SPRAYS INTO EACH NOSTRIL EVERY DAY 48 mL 0   lansoprazole (PREVACID) 30 MG capsule Take 1 capsule (30 mg total) by mouth 2 (two) times daily. 180 capsule 3   No facility-administered medications prior to visit.    Allergies  Allergen Reactions   Oxycodone Other (See Comments)    nausea    Review of Systems  Constitutional:  Negative for appetite change, chills, fatigue and fever.  HENT:  Negative for congestion, dental problem, ear pain and sore throat.   Eyes:  Negative for discharge, redness and visual disturbance.  Respiratory:  Negative for cough, chest tightness, shortness of breath and wheezing.   Cardiovascular:  Negative for chest pain, palpitations and leg swelling.  Gastrointestinal:  Negative for abdominal pain, blood in stool, diarrhea, nausea and vomiting.  Genitourinary:  Negative for difficulty urinating, dysuria, flank pain, frequency, hematuria and urgency.  Musculoskeletal:  Negative for arthralgias, back pain, joint swelling, myalgias and neck stiffness.  Skin:  Negative for pallor and rash.  Neurological:  Negative for dizziness, speech difficulty, weakness and headaches.  Hematological:  Negative for adenopathy. Does not bruise/bleed easily.  Psychiatric/Behavioral:  Negative for confusion  and sleep disturbance. The patient is not nervous/anxious.     PE;    12/28/2023    9:00 AM 12/28/2023    8:52 AM 11/03/2023   11:16 AM  Vitals with BMI  Height  5' 10.5"   Weight  155 lbs 13 oz   BMI  22.03   Systolic 150 150 604  Diastolic 84 84 87  Pulse  82 101    Gen: Alert, well appearing.  Patient is oriented to person, place, time, and situation. AFFECT: pleasant, lucid thought and speech. ENT: Ears: EACs clear, normal epithelium.  TMs with good light reflex and landmarks bilaterally.  Eyes: no injection, icteris, swelling, or exudate.  EOMI, PERRLA. Nose: no drainage or turbinate edema/swelling.  No injection or focal lesion.  Mouth: lips without lesion/swelling.  Oral mucosa pink and moist.  Dentition intact and without obvious caries or gingival swelling.  Oropharynx without erythema, exudate, or swelling.  Neck: supple/nontender.  No LAD, mass, or TM.  Carotid pulses 2+ bilaterally, without bruits. CV: RRR, no m/r/g.   LUNGS: CTA bilat, nonlabored resps, good aeration in all lung fields. ABD: soft, NT, ND, BS normal.  No hepatospenomegaly or mass.  No bruits. EXT: no clubbing, cyanosis, or edema.  Musculoskeletal: no joint swelling, erythema, warmth, or tenderness.  ROM of all joints intact. Skin - no sores or suspicious lesions or rashes or color changes  Pertinent labs:  Lab Results  Component Value Date   TSH 1.94 06/23/2018   Lab Results  Component Value Date   WBC 7.0 06/11/2023   HGB 12.9 (L) 06/11/2023   HCT 39.6 06/11/2023   MCV 97.0 06/11/2023   PLT 651.0 (H) 06/11/2023   Lab Results  Component Value Date   CREATININE 0.76 06/11/2023   BUN 6 06/11/2023   NA 135 06/11/2023   K 4.6 06/11/2023   CL 99 06/11/2023   CO2 29  06/11/2023   Lab Results  Component Value Date   ALT 26 05/13/2023   AST 22 05/13/2023   ALKPHOS 66 11/12/2022   BILITOT 1.0 11/12/2022   Lab Results  Component Value Date   CHOL 158 05/13/2023   Lab Results  Component  Value Date   HDL 66.10 05/13/2023   Lab Results  Component Value Date   LDLCALC 79 05/13/2023   Lab Results  Component Value Date   TRIG 65.0 05/13/2023   Lab Results  Component Value Date   CHOLHDL 2 05/13/2023   Lab Results  Component Value Date   PSA 0.15 11/12/2022   PSA 0.20 09/18/2021   PSA 0.16 07/12/2020   Lab Results  Component Value Date   TESTOSTERONE 588 05/29/2022   ASSESSMENT AND PLAN:   No problem-specific Assessment & Plan notes found for this encounter.  #1 health maintenance exam: Reviewed age and gender appropriate health maintenance issues (prudent diet, regular exercise, health risks of tobacco and excessive alcohol, use of seatbelts, fire alarms in home, use of sunscreen).  Also reviewed age and gender appropriate health screening as well as vaccine recommendations. Vaccines: All up-to-date Labs: cbc, cmet, flp, psa. Prostate ca screening: PSA today. Colon ca screening: recall 10/2028.  #2 osteoporosis, due for rpt DEXA.  Has been on alendronate 3 years.  Encouraged him to follow-up with Dr. Benjamin Stain.  #3 whitecoat hypertension. Encouraged him to continue periodic home BP monitoring.  #4 hypercholesterolemia, doing well on a atorvastatin 40 mg daily. Lipid panel and hepatic panel today.  5.  Nicotine dependence.  He was vaping regularly.  He has done well on Wellbutrin XL 150 mg a day.  He is stopped it after a while but then restarted it when cravings returned.  Will continue this dose daily for several more months.  An After Visit Summary was printed and given to the patient.  FOLLOW UP:  Return in about 1 year (around 12/27/2024) for annual CPE (fasting).  Signed:  Santiago Bumpers, MD           12/28/2023

## 2023-12-28 ENCOUNTER — Ambulatory Visit (INDEPENDENT_AMBULATORY_CARE_PROVIDER_SITE_OTHER): Payer: BC Managed Care – PPO | Admitting: Family Medicine

## 2023-12-28 ENCOUNTER — Encounter: Payer: Self-pay | Admitting: Family Medicine

## 2023-12-28 VITALS — BP 150/84 | HR 82 | Temp 97.9°F | Ht 70.5 in | Wt 155.8 lb

## 2023-12-28 DIAGNOSIS — Z0001 Encounter for general adult medical examination with abnormal findings: Secondary | ICD-10-CM

## 2023-12-28 DIAGNOSIS — E7800 Pure hypercholesterolemia, unspecified: Secondary | ICD-10-CM | POA: Diagnosis not present

## 2023-12-28 DIAGNOSIS — Z125 Encounter for screening for malignant neoplasm of prostate: Secondary | ICD-10-CM

## 2023-12-28 DIAGNOSIS — F17290 Nicotine dependence, other tobacco product, uncomplicated: Secondary | ICD-10-CM

## 2023-12-28 DIAGNOSIS — Z0000 Encounter for general adult medical examination without abnormal findings: Secondary | ICD-10-CM

## 2023-12-28 DIAGNOSIS — R030 Elevated blood-pressure reading, without diagnosis of hypertension: Secondary | ICD-10-CM

## 2023-12-28 DIAGNOSIS — M8000XD Age-related osteoporosis with current pathological fracture, unspecified site, subsequent encounter for fracture with routine healing: Secondary | ICD-10-CM

## 2023-12-28 LAB — LIPID PANEL
Cholesterol: 171 mg/dL (ref 0–200)
HDL: 78.7 mg/dL (ref >39.00)
LDL Cholesterol: 83 mg/dL (ref 0–99)
NonHDL: 92.31
Total CHOL/HDL Ratio: 2
Triglycerides: 49 mg/dL (ref 0.0–149.0)
VLDL: 9.8 mg/dL (ref 0.0–40.0)

## 2023-12-28 LAB — CBC WITH DIFFERENTIAL/PLATELET
Basophils Absolute: 0 10*3/uL (ref 0.0–0.1)
Basophils Relative: 0.7 % (ref 0.0–3.0)
Eosinophils Absolute: 0.1 10*3/uL (ref 0.0–0.7)
Eosinophils Relative: 1.9 % (ref 0.0–5.0)
HCT: 41.6 % (ref 39.0–52.0)
Hemoglobin: 14 g/dL (ref 13.0–17.0)
Lymphocytes Relative: 13.6 % (ref 12.0–46.0)
Lymphs Abs: 0.9 10*3/uL (ref 0.7–4.0)
MCHC: 33.5 g/dL (ref 30.0–36.0)
MCV: 98.8 fl (ref 78.0–100.0)
Monocytes Absolute: 0.9 10*3/uL (ref 0.1–1.0)
Monocytes Relative: 13.7 % — ABNORMAL HIGH (ref 3.0–12.0)
Neutro Abs: 4.8 10*3/uL (ref 1.4–7.7)
Neutrophils Relative %: 70.1 % (ref 43.0–77.0)
Platelets: 415 10*3/uL — ABNORMAL HIGH (ref 150.0–400.0)
RBC: 4.21 Mil/uL — ABNORMAL LOW (ref 4.22–5.81)
RDW: 13.7 % (ref 11.5–15.5)
WBC: 6.8 10*3/uL (ref 4.0–10.5)

## 2023-12-28 LAB — COMPREHENSIVE METABOLIC PANEL
ALT: 27 U/L (ref 0–53)
AST: 27 U/L (ref 0–37)
Albumin: 4.6 g/dL (ref 3.5–5.2)
Alkaline Phosphatase: 63 U/L (ref 39–117)
BUN: 8 mg/dL (ref 6–23)
CO2: 29 meq/L (ref 19–32)
Calcium: 9.7 mg/dL (ref 8.4–10.5)
Chloride: 98 meq/L (ref 96–112)
Creatinine, Ser: 0.9 mg/dL (ref 0.40–1.50)
GFR: 87.27 mL/min (ref >60.00)
Glucose, Bld: 87 mg/dL (ref 70–99)
Potassium: 5.5 meq/L — ABNORMAL HIGH (ref 3.5–5.1)
Sodium: 134 meq/L — ABNORMAL LOW (ref 135–145)
Total Bilirubin: 0.9 mg/dL (ref 0.2–1.2)
Total Protein: 6.9 g/dL (ref 6.0–8.3)

## 2023-12-28 LAB — PSA, MEDICARE: PSA: 0.13 ng/mL (ref 0.10–4.00)

## 2023-12-28 MED ORDER — ATORVASTATIN CALCIUM 40 MG PO TABS: 40.0000 mg | ORAL_TABLET | Freq: Every day | ORAL | 1 refills | Status: DC

## 2023-12-28 MED ORDER — LANSOPRAZOLE 30 MG PO CPDR: 30.0000 mg | DELAYED_RELEASE_CAPSULE | Freq: Two times a day (BID) | ORAL | 3 refills | Status: DC

## 2023-12-28 MED ORDER — BUPROPION HCL ER (XL) 150 MG PO TB24: 150.0000 mg | ORAL_TABLET | Freq: Every day | ORAL | 1 refills | Status: DC

## 2023-12-28 MED ORDER — FLUTICASONE PROPIONATE 50 MCG/ACT NA SUSP: NASAL | 0 refills | Status: AC

## 2023-12-28 MED ORDER — BUPROPION HCL ER (XL) 150 MG PO TB24: 150.0000 mg | ORAL_TABLET | Freq: Every day | ORAL | 2 refills | Status: DC

## 2023-12-28 NOTE — Telephone Encounter (Signed)
Pt has scheduled appt today

## 2023-12-28 NOTE — Patient Instructions (Signed)
 Health Maintenance, Male  Adopting a healthy lifestyle and getting preventive care are important in promoting health and wellness. Ask your health care provider about:  The right schedule for you to have regular tests and exams.  Things you can do on your own to prevent diseases and keep yourself healthy.  What should I know about diet, weight, and exercise?  Eat a healthy diet    Eat a diet that includes plenty of vegetables, fruits, low-fat dairy products, and lean protein.  Do not eat a lot of foods that are high in solid fats, added sugars, or sodium.  Maintain a healthy weight  Body mass index (BMI) is a measurement that can be used to identify possible weight problems. It estimates body fat based on height and weight. Your health care provider can help determine your BMI and help you achieve or maintain a healthy weight.  Get regular exercise  Get regular exercise. This is one of the most important things you can do for your health. Most adults should:  Exercise for at least 150 minutes each week. The exercise should increase your heart rate and make you sweat (moderate-intensity exercise).  Do strengthening exercises at least twice a week. This is in addition to the moderate-intensity exercise.  Spend less time sitting. Even light physical activity can be beneficial.  Watch cholesterol and blood lipids  Have your blood tested for lipids and cholesterol at 70 years of age, then have this test every 5 years.  You may need to have your cholesterol levels checked more often if:  Your lipid or cholesterol levels are high.  You are older than 70 years of age.  You are at high risk for heart disease.  What should I know about cancer screening?  Many types of cancers can be detected early and may often be prevented. Depending on your health history and family history, you may need to have cancer screening at various ages. This may include screening for:  Colorectal cancer.  Prostate cancer.  Skin cancer.  Lung  cancer.  What should I know about heart disease, diabetes, and high blood pressure?  Blood pressure and heart disease  High blood pressure causes heart disease and increases the risk of stroke. This is more likely to develop in people who have high blood pressure readings or are overweight.  Talk with your health care provider about your target blood pressure readings.  Have your blood pressure checked:  Every 3-5 years if you are 9-95 years of age.  Every year if you are 85 years old or older.  If you are between the ages of 29 and 29 and are a current or former smoker, ask your health care provider if you should have a one-time screening for abdominal aortic aneurysm (AAA).  Diabetes  Have regular diabetes screenings. This checks your fasting blood sugar level. Have the screening done:  Once every three years after age 23 if you are at a normal weight and have a low risk for diabetes.  More often and at a younger age if you are overweight or have a high risk for diabetes.  What should I know about preventing infection?  Hepatitis B  If you have a higher risk for hepatitis B, you should be screened for this virus. Talk with your health care provider to find out if you are at risk for hepatitis B infection.  Hepatitis C  Blood testing is recommended for:  Everyone born from [birth year] through 1967.  Anyone  with known risk factors for hepatitis C.  Sexually transmitted infections (STIs)  You should be screened each year for STIs, including gonorrhea and chlamydia, if:  You are sexually active and are younger than 70 years of age.  You are older than 70 years of age and your health care provider tells you that you are at risk for this type of infection.  Your sexual activity has changed since you were last screened, and you are at increased risk for chlamydia or gonorrhea. Ask your health care provider if you are at risk.  Ask your health care provider about whether you are at high risk for HIV. Your health care provider  may recommend a prescription medicine to help prevent HIV infection. If you choose to take medicine to prevent HIV, you should first get tested for HIV. You should then be tested every 3 months for as long as you are taking the medicine.  Follow these instructions at home:  Alcohol use  Do not drink alcohol if your health care provider tells you not to drink.  If you drink alcohol:  Limit how much you have to 0-2 drinks a day.  Know how much alcohol is in your drink. In the [place] one drink equals one 12 oz bottle of beer (355 mL), one 5 oz glass of wine (148 mL), or one 1 oz glass of hard liquor (44 mL).  Lifestyle  Do not use any products that contain nicotine or tobacco. These products include cigarettes, chewing tobacco, and vaping devices, such as e-cigarettes. If you need help quitting, ask your health care provider.  Do not use street drugs.  Do not share needles.  Ask your health care provider for help if you need support or information about quitting drugs.  General instructions  Schedule regular health, dental, and eye exams.  Stay current with your vaccines.  Tell your health care provider if:  You often feel depressed.  You have ever been abused or do not feel safe at home.  Summary  Adopting a healthy lifestyle and getting preventive care are important in promoting health and wellness.  Follow your health care provider's instructions about healthy diet, exercising, and getting tested or screened for diseases.  Follow your health care provider's instructions on monitoring your cholesterol and blood pressure.  This information is not intended to replace advice given to you by your health care provider. Make sure you discuss any questions you have with your health care provider.  Document Revised: 02/10/2021 Document Reviewed: 02/10/2021  Elsevier Patient Education  2024 [employer].

## 2023-12-29 ENCOUNTER — Telehealth: Payer: Self-pay

## 2023-12-29 ENCOUNTER — Encounter: Payer: BC Managed Care – PPO | Admitting: Family Medicine

## 2023-12-29 DIAGNOSIS — E875 Hyperkalemia: Secondary | ICD-10-CM

## 2023-12-29 NOTE — Telephone Encounter (Signed)
-----   Message from Jeoffrey Massed sent at 12/29/2023 12:43 PM EDT ----- All labs great except potassium is a little elevated. I don't think this is accurate, though. Pls return to lab for recheck bmet at earliest convenience, pls order, dx hyperkalemia.

## 2024-02-10 ENCOUNTER — Ambulatory Visit
Admission: EM | Admit: 2024-02-10 | Discharge: 2024-02-10 | Disposition: A | Discharge status: Home / Self Care | Attending: Family Medicine | Admitting: Family Medicine

## 2024-02-10 DIAGNOSIS — J069 Acute upper respiratory infection, unspecified: Secondary | ICD-10-CM

## 2024-02-10 DIAGNOSIS — R059 Cough, unspecified: Secondary | ICD-10-CM | POA: Diagnosis not present

## 2024-02-10 MED ORDER — PREDNISONE 20 MG PO TABS: ORAL_TABLET | ORAL | 0 refills | Status: DC

## 2024-02-10 MED ORDER — DOXYCYCLINE HYCLATE 100 MG PO CAPS: 100.0000 mg | ORAL_CAPSULE | Freq: Two times a day (BID) | ORAL | 0 refills | Status: AC

## 2024-02-10 NOTE — Discharge Instructions (Addendum)
 Advised patient take medications as directed with food to completion.  Advised patient to take prednisone  with first dose of doxycycline  for the next 5 of 7 days.  Encouraged to increase daily water intake to 64 ounces per day while taking these medications.  Advised if symptoms worsen and/or unresolved please follow-up with your PCP or here for further evaluation.

## 2024-02-10 NOTE — ED Provider Notes (Signed)
 [facility]    CSN: [id number] Arrival date & time: 02/10/24  2130      History   Chief Complaint No chief complaint on file.   HPI Jacob Bl[facility]ard is a 68 y.o. male.   HPI Pleasant 68 year old male presents with persistent cough and dry throat for 3 weeks.  PMH significant for HLD, Barrett's esophagus, and chronic constipation.  Past Medical History:  Diagnosis Date   Anxiety    prn xanax  started 01/2019->self d/c'd in fall/winter 2020   Barrett's esophagus    Bx+ 2007, 2009, and 2020.   Chronic constipation    GI->Linzess  trial 05/2019->helpful   Contact dermatitis and other eczema, due to unspecified cause    +scarring   Diverticulosis 2021   noted on CT abd 06/2020   Epididymal cyst 06/2018   Bilat: small on R, but 4 cm on left  Asymptomatic.   GERD (gastroesophageal reflux disease)    History of adenomatous polyp of colon 08/10/2006; 01/2020   2007 and 12/2018; 01/2020, 10/2023, recall 2030.   Hyperlipidemia    Incisional hernia    Left foot pain    ? Arch strain.  Some numbness to suggest possible tarsal tunnel syndrome vs lumbar radiculitis (sports med MD).  Responded very well to custom orthotic.   Migraine syndrome    verapamil prophylaxis, cluster migraine   Osteoporosis    fosamax  started 2022   Pseudophakia of both eyes    Right wrist pain    04/2023 Sprain, scapholunate widening on f/u xray 06/2023-->ref ortho   Seasonal and perennial allergic rhinitis    vaccine/injection therapy helpful   Small bowel obstruction (HCC) Fall of 2019   surgery->lysis of adhesions   Vertebral compression fracture (HCC) 05/2019   T11 and T12 and L 1, minimal trauma.  Dr. Elva Hamburger put him on fosamax .    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   Dupuytren's contracture of both hands 03/21/2021   Presbyopia 09/03/2020   Generalized abdominal pain 05/29/2020   History of  adenomatous polyp of colon 12/14/2019   Umbilical hernia without obstruction and without gangrene 12/14/2019   Osteoporosis 05/24/2019   Lumbar spondylosis 05/12/2019   Thoracic compression fracture (HCC) 05/12/2019   Chronic constipation 05/12/2019   Partial bowel obstruction (HCC) 02/19/2019   Left foot pain 01/21/2017   Maxillary sinusitis 07/19/2015   Chronic asthma 10/03/2012   Seasonal and perennial allergic rhinitis 06/05/2008   GERD 06/05/2008   Eczema 06/05/2008   Fatigue 02/21/2008    Past Surgical History:  Procedure Laterality Date   ABDOMINAL EXPLORATION SURGERY  Fall 2019   SBO->lysis of adhesions   COLONOSCOPY W/ POLYPECTOMY  08/10/06; 12/2018   Several adenomatous polyps 2007 and 2020; 01-25-2020. Jan 2025 adenoma-->recall 2030.   DEXA  05/24/2019   Dr. Trent Frizzle -2.4 + vert comp fx->fosamax  started.  06/2021 T score -1.8   ESOPHAGOGASTRODUODENOSCOPY  2007 and 07/20/08;12/2018   GERD, BARRET's esoph (Dr. Elvin Hammer). Same 12/2018. Same 10/2023.  No further f/u EGD needed per GI.   INCISIONAL HERNIA REPAIR N/A 02/08/2020   Procedure: LAPAROSCOPIC INCISIONAL HERNIA REPAIR WITH MESH;  Surgeon: Kinsinger, Alphonso Aschoff, MD;  Location: [facility];  Service: General;  Laterality: N/A;   INTRAOCULAR LENS INSERTION  09/03/2020   OU   LE venous doppler u/s  04/19/2015   L leg NORMAL   right knee arthroscopy  1980s  Torn meniscus (Dr. Jinger Mount)       Home Medications    Prior to Admission medications   Medication Sig Start Date End Date Taking? Authorizing Provider  doxycycline  (VIBRAMYCIN ) 100 MG capsule Take 1 capsule (100 mg total) by mouth 2 (two) times daily for 7 days. 02/10/24 02/17/24 Yes Leonides Ramp, FNP  predniSONE  (DELTASONE ) 20 MG tablet Take 3 tabs PO daily x 5 days. 02/10/24  Yes Leonides Ramp, FNP  alendronate  (FOSAMAX ) 70 MG tablet TAKE 1 TABLET (70 MG TOTAL) BY MOUTH EVERY 7 DAYS WITH FULL GLASS WATER ON EMPTY STOMACH 06/21/23   Gean Keels, MD  atorvastatin  (LIPITOR) 40 MG tablet Take 1 tablet (40 mg total) by mouth daily. 12/28/23   McGowen, Minetta Aly, MD  Calcium  Carbonate-Vitamin D3 (CALCIUM  + VITAMIN D3) 600-400 MG-UNIT TABS Take 1 tablet by mouth 2 (two) times daily. 03/21/21   Gean Keels, MD  cyclobenzaprine  (FLEXERIL ) 10 MG tablet TAKE 1 TABLET BY MOUTH TWICE A DAY AS NEEDED 05/12/22   McGowen, Minetta Aly, MD  dicyclomine  (BENTYL ) 10 MG capsule TAKE 1 CAPSULE BY MOUTH TWICE A DAY 12/07/23   Zehr, Jessica D, PA-C  fluticasone  (FLONASE ) 50 MCG/ACT nasal spray SPRAY 2 SPRAYS INTO EACH NOSTRIL EVERY DAY 12/28/23   McGowen, Minetta Aly, MD  ibuprofen  (ADVIL ) 200 MG tablet Take 4 tablets (800 mg total) by mouth every 8 (eight) hours as needed. 07/09/22   Gean Keels, MD  lansoprazole  (PREVACID ) 30 MG capsule Take 1 capsule (30 mg total) by mouth 2 (two) times daily. 12/28/23   McGowen, Minetta Aly, MD  linaclotide  (LINZESS ) 290 MCG CAPS capsule TAKE 1 CAPSULE BY MOUTH DAILY BEFORE BREAKFAST. 09/08/23   Zehr, Jessica D, PA-C  polyethylene glycol powder (GLYCOLAX/MIRALAX) 17 GM/SCOOP powder Take by mouth.    [provider]  verapamil (CALAN) 120 MG tablet Take 60 mg by mouth once. For migraines 06/19/19   [provider]    Family History Family History  Problem Relation Age of Onset   Other Father        chemical PNA siphoning gas   Healthy Brother    Colon cancer Neg Hx    Esophageal cancer Neg Hx    Rectal cancer Neg Hx    Stomach cancer Neg Hx     Social History Social History   Tobacco Use   Smoking status: Former    Current packs/day: 0.00    Average packs/day: 1 pack/day for 10.0 years (10.0 ttl pk-yrs)    Types: Cigarettes    Start date: 07/07/1983    Quit date: 07/06/1993    Years since quitting: 30.6   Smokeless tobacco: Never  Vaping Use   Vaping status: Never Used  Substance Use Topics   Alcohol use: Yes    Comment: 1 or 2 daily   Drug use: No     Allergies    Oxycodone   Review of Systems Review of Systems  Respiratory:  Positive for cough.   All other systems reviewed and are negative.    Physical Exam Triage Vital Signs ED Triage Vitals  Encounter Vitals Group     BP 02/10/24 0839 (!) 161/102     Systolic BP Percentile --      Diastolic BP Percentile --      Pulse Rate 02/10/24 0839 85     Resp 02/10/24 0839 16     Temp 02/10/24 0839 97.9 F (36.6 C)     Temp src --  SpO2 02/10/24 0839 98 %     Weight --      Height --      Head Circumference --      Peak Flow --      Pain Score 02/10/24 0837 0     Pain Loc --      Pain Education --      Exclude from Growth Chart --    No data found.  Updated Vital Signs BP (!) 160/98   Pulse 85   Temp 97.9 F (36.6 C)   Resp 16   SpO2 98%   Physical Exam Vitals and nursing note reviewed.  Constitutional:      Appearance: Normal appearance. He is normal weight.  HENT:     Head: Normocephalic and atraumatic.     Right Ear: Tympanic membrane, ear canal and external ear normal.     Left Ear: Tympanic membrane, ear canal and external ear normal.     Mouth/Throat:     Mouth: Mucous membranes are moist.     Pharynx: Oropharynx is clear.  Eyes:     Extraocular Movements: Extraocular movements intact.     Conjunctiva/sclera: Conjunctivae normal.     Pupils: Pupils are equal, round, and reactive to light.  Cardiovascular:     Rate and Rhythm: Normal rate and regular rhythm.     Pulses: Normal pulses.     Heart sounds: Normal heart sounds.  Pulmonary:     Effort: Pulmonary effort is normal.     Breath sounds: Normal breath sounds. No wheezing, rhonchi or rales.     Comments: Infrequent nonproductive cough on exam Musculoskeletal:        General: Normal range of motion.     Cervical back: Normal range of motion and neck supple.  Skin:    General: Skin is warm and dry.  Neurological:     General: No focal deficit present.     Mental Status: He is alert and oriented to  person, place, and time. Mental status is at baseline.      UC Treatments / Results  Labs (all labs ordered are listed, but only abnormal results are displayed) Labs Reviewed - No data to display  EKG   Radiology No results found.  Procedures Procedures (including critical care time)  Medications Ordered in UC Medications - No data to display  Initial Impression / Assessment and Plan / UC Course  I have reviewed the triage vital signs and the nursing notes.  Pertinent labs & imaging results that were available during my care of the patient were reviewed by me and considered in my medical decision making (see chart for details).     MDM: 1.  Acute upper respiratory infection-Rx'd doxycycline  100 mg capsule: Take 1 capsule twice daily x 7 days; 2.  Cough, unspecified type-Rx prednisone  20 mg tablet: Take 3 tablets p.o. daily x 5 days. Advised patient take medications as directed with food to completion.  Advised patient to take prednisone  with first dose of doxycycline  for the next 5 of 7 days.  Encouraged to increase daily water intake to 64 ounces per day while taking these medications.  Advised if symptoms worsen and/or unresolved please follow-up with your PCP or here for further evaluation.  Final Clinical Impressions(s) / UC Diagnoses   Final diagnoses:  Acute upper respiratory infection  Cough, unspecified type     Discharge Instructions      Advised patient take medications as directed with food to completion.  Advised patient to take prednisone  with first dose of doxycycline  for the next 5 of 7 days.  Encouraged to increase daily water intake to 64 ounces per day while taking these medications.  Advised if symptoms worsen and/or unresolved please follow-up with your PCP or here for further evaluation.   ED Prescriptions     Medication Sig Dispense Auth. Provider   predniSONE  (DELTASONE ) 20 MG tablet Take 3 tabs PO daily x 5 days. 15 tablet Future Yeldell, FNP    doxycycline  (VIBRAMYCIN ) 100 MG capsule Take 1 capsule (100 mg total) by mouth 2 (two) times daily for 7 days. 14 capsule Mariano Doshi, FNP      PDMP not reviewed this encounter.   Leonides Ramp, FNP 02/10/24 [phone]

## 2024-02-10 NOTE — ED Triage Notes (Signed)
 Pt presents to [facility] with co cough for 3 weeks with dry throat.

## 2024-03-30 ENCOUNTER — Telehealth: Payer: Self-pay

## 2024-03-30 DIAGNOSIS — M8000XD Age-related osteoporosis with current pathological fracture, unspecified site, subsequent encounter for fracture with routine healing: Secondary | ICD-10-CM

## 2024-03-30 NOTE — Telephone Encounter (Signed)
 I am happy to order it

## 2024-03-30 NOTE — Telephone Encounter (Signed)
 Copied from CRM [id number]. Topic: Clinical - Request for Lab/Test Order >> Mar 30, 2024  9:17 AM Alfonso ORN wrote: Reason for CRM: patient requesting to order a bone Density test and have question if its time to get another bone density and want to know if phillip McGowen, or Dr. Curtis  Patient call back number [phone]   ----------------------------------------------------------------------- From previous Reason for Contact - Other: Reason for CRM: patient request to get a bone density test , he had one before that Dr. ONEIDA scheduled

## 2024-04-12 ENCOUNTER — Other Ambulatory Visit: Payer: Self-pay

## 2024-04-12 ENCOUNTER — Ambulatory Visit
Admission: EM | Admit: 2024-04-12 | Discharge: 2024-04-12 | Disposition: A | Discharge status: Home / Self Care | Attending: Family Medicine | Admitting: Family Medicine

## 2024-04-12 DIAGNOSIS — J209 Acute bronchitis, unspecified: Secondary | ICD-10-CM

## 2024-04-12 MED ORDER — PREDNISONE 50 MG PO TABS
ORAL_TABLET | ORAL | 0 refills | Status: AC
Start: 2024-04-12 — End: ?

## 2024-04-12 MED ORDER — DOXYCYCLINE HYCLATE 100 MG PO CAPS: 100.0000 mg | ORAL_CAPSULE | Freq: Two times a day (BID) | ORAL | 1 refills | Status: AC

## 2024-04-12 NOTE — Discharge Instructions (Signed)
 Take the prednisone  once a day for 5 days Take the doxycycline  2 times a day Make sure you are drinking lots of water Take all your medicine with food I have given you a refill for the doxycycline  in case it is needed Follow-up with your primary care doctor

## 2024-04-12 NOTE — ED Provider Notes (Signed)
 Jacob Blanchard    CSN: [id number] Arrival date & time: 04/12/24  1440      History   Chief Complaint Chief Complaint  Patient presents with   Cough    HPI Jacob Blanchard is a 68 y.o. male.   HPI Patient complains of chronic cough.  He used to be a smoker.  He thinks he may have some emphysema.  He states that he was treated here for a cough in May.  He felt better but then the cough came back again.  He thinks he needs another course of antibiotics.  He states he always needs 2.  He did see improvement on the doxycycline  and the prednisone  prescribed previously.  He is taking over-the-counter cough medicines with good results.  He states he is producing some yellow sputum.  No chest pain.  No fever.  No fatigue.  No history of pneumonia Past Medical History:  Diagnosis Date   Anxiety    prn xanax  started 01/2019->self d/c'd in fall/winter 2020   Barrett's esophagus    Bx+ 2007, 2009, and 2020.   Chronic constipation    GI->Linzess  trial 05/2019->helpful   Contact dermatitis and other eczema, due to unspecified cause    +scarring   Diverticulosis 2021   noted on CT abd 06/2020   Epididymal cyst 06/2018   Bilat: small on R, but 4 cm on left  Asymptomatic.   GERD (gastroesophageal reflux disease)    History of adenomatous polyp of colon 08/10/2006; 01/2020   2007 and 12/2018; 01/2020, 10/2023, recall 2030.   Hyperlipidemia    Incisional hernia    Left foot pain    ? Arch strain.  Some numbness to suggest possible tarsal tunnel syndrome vs lumbar radiculitis (sports med MD).  Responded very well to custom orthotic.   Migraine syndrome    verapamil prophylaxis, cluster migraine   Osteoporosis    fosamax  started 2022   Pseudophakia of both eyes    Right wrist pain    04/2023 Sprain, scapholunate widening on f/u xray 06/2023-->ref ortho   Seasonal and perennial allergic rhinitis    vaccine/injection therapy helpful   Small bowel obstruction (HCC) Fall of 2019    surgery->lysis of adhesions   Vertebral compression fracture (HCC) 05/2019   T11 and T12 and L 1, minimal trauma.  Dr. ONEIDA put him on fosamax .    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   Dupuytren's contracture of both hands 03/21/2021   Presbyopia 09/03/2020   Generalized abdominal pain 05/29/2020   History of adenomatous polyp of colon 12/14/2019   Umbilical hernia without obstruction and without gangrene 12/14/2019   Osteoporosis 05/24/2019   Lumbar spondylosis 05/12/2019   Thoracic compression fracture (HCC) 05/12/2019   Chronic constipation 05/12/2019   Partial bowel obstruction (HCC) 02/19/2019   Left foot pain 01/21/2017   Maxillary sinusitis 07/19/2015   Chronic asthma 10/03/2012   Seasonal and perennial allergic rhinitis 06/05/2008   GERD 06/05/2008   Eczema 06/05/2008   Fatigue 02/21/2008    Past Surgical History:  Procedure Laterality Date   ABDOMINAL EXPLORATION SURGERY  Fall 2019   SBO->lysis of adhesions   COLONOSCOPY W/ POLYPECTOMY  08/10/06; 12/2018   Several adenomatous polyps 2007 and 2020; 01-25-2020. Jan 2025 adenoma-->recall 2030.   DEXA  05/24/2019   Dr. Loran -2.4 + vert comp fx->fosamax  started.  06/2021 T score -1.8  ESOPHAGOGASTRODUODENOSCOPY  2007 and 07/20/08;12/2018   GERD, BARRET's esoph (Dr. Abran). Same 12/2018. Same 10/2023.  No further f/u EGD needed per GI.   INCISIONAL HERNIA REPAIR N/A 02/08/2020   Procedure: LAPAROSCOPIC INCISIONAL HERNIA REPAIR WITH MESH;  Surgeon: Kinsinger, Herlene Righter, MD;  Location: [facility];  Service: General;  Laterality: N/A;   INTRAOCULAR LENS INSERTION  09/03/2020   OU   LE venous doppler u/s  04/19/2015   L leg NORMAL   right knee arthroscopy  1980s   Torn meniscus (Dr. Jane)       Home Medications    Prior to Admission medications   Medication Sig Start Date End Date Taking?  Authorizing Provider  doxycycline  (VIBRAMYCIN ) 100 MG capsule Take 1 capsule (100 mg total) by mouth 2 (two) times daily. 04/12/24  Yes Maranda Jamee Jacob, MD  predniSONE  (DELTASONE ) 50 MG tablet Take once a day for 5 days.  Take with food 04/12/24  Yes Maranda Jamee Jacob, MD  alendronate  (FOSAMAX ) 70 MG tablet TAKE 1 TABLET (70 MG TOTAL) BY MOUTH EVERY 7 DAYS WITH FULL GLASS WATER ON EMPTY STOMACH 06/21/23   Curtis Debby PARAS, MD  atorvastatin  (LIPITOR) 40 MG tablet Take 1 tablet (40 mg total) by mouth daily. 12/28/23   McGowen, Aleene DEL, MD  Calcium  Carbonate-Vitamin D3 (CALCIUM  + VITAMIN D3) 600-400 MG-UNIT TABS Take 1 tablet by mouth 2 (two) times daily. 03/21/21   Curtis Debby PARAS, MD  cyclobenzaprine  (FLEXERIL ) 10 MG tablet TAKE 1 TABLET BY MOUTH TWICE A DAY AS NEEDED 05/12/22   McGowen, Aleene DEL, MD  dicyclomine  (BENTYL ) 10 MG capsule TAKE 1 CAPSULE BY MOUTH TWICE A DAY 12/07/23   Zehr, Jessica D, PA-C  fluticasone  (FLONASE ) 50 MCG/ACT nasal spray SPRAY 2 SPRAYS INTO EACH NOSTRIL EVERY DAY 12/28/23   McGowen, Aleene DEL, MD  ibuprofen  (ADVIL ) 200 MG tablet Take 4 tablets (800 mg total) by mouth every 8 (eight) hours as needed. 07/09/22   Curtis Debby PARAS, MD  lansoprazole  (PREVACID ) 30 MG capsule Take 1 capsule (30 mg total) by mouth 2 (two) times daily. 12/28/23   McGowen, Aleene DEL, MD  linaclotide  (LINZESS ) 290 MCG CAPS capsule TAKE 1 CAPSULE BY MOUTH DAILY BEFORE BREAKFAST. 09/08/23   Zehr, Jessica D, PA-C  polyethylene glycol powder (GLYCOLAX/MIRALAX) 17 GM/SCOOP powder Take by mouth.    [provider]  verapamil (CALAN) 120 MG tablet Take 60 mg by mouth once. For migraines 06/19/19   [provider]    Family History Family History  Problem Relation Age of Onset   Other Father        chemical PNA siphoning gas   Healthy Brother    Colon cancer Neg Hx    Esophageal cancer Neg Hx    Rectal cancer Neg Hx    Stomach cancer Neg Hx     Social History Social  History   Tobacco Use   Smoking status: Former    Current packs/day: 0.00    Average packs/day: 1 pack/day for 10.0 years (10.0 ttl pk-yrs)    Types: Cigarettes    Start date: 07/07/1983    Quit date: 07/06/1993    Years since quitting: 30.7   Smokeless tobacco: Never  Vaping Use   Vaping status: Never Used  Substance Use Topics   Alcohol use: Yes    Comment: 1 or 2 daily   Drug use: No     Allergies   Oxycodone   Review of Systems Review of Systems  See HPI  Physical Exam Triage Vital Signs ED Triage Vitals  Encounter Vitals Group     BP 04/12/24 1451 (!) 150/88     Girls Systolic BP Percentile --      Girls Diastolic BP Percentile --      Boys Systolic BP Percentile --      Boys Diastolic BP Percentile --      Pulse Rate 04/12/24 1451 95     Resp 04/12/24 1451 16     Temp 04/12/24 1451 98.1 F (36.7 C)     Temp src --      SpO2 04/12/24 1451 96 %     Weight --      Height --      Head Circumference --      Peak Flow --      Pain Score 04/12/24 1454 0     Pain Loc --      Pain Education --      Exclude from Growth Chart --    No data found.  Updated Vital Signs BP (!) 150/88   Pulse 95   Temp 98.1 F (36.7 C)   Resp 16   SpO2 96%   Physical Exam Constitutional:      General: He is not in acute distress.    Appearance: He is well-developed and normal weight.  HENT:     Head: Normocephalic and atraumatic.     Right Ear: Tympanic membrane normal.     Left Ear: Tympanic membrane normal.     Nose: No congestion or rhinorrhea.  Eyes:     Conjunctiva/sclera: Conjunctivae normal.     Pupils: Pupils are equal, round, and reactive to light.  Cardiovascular:     Rate and Rhythm: Normal rate and regular rhythm.     Heart sounds: Normal heart sounds.  Pulmonary:     Effort: Pulmonary effort is normal. No respiratory distress.     Breath sounds: Wheezing and rhonchi present.  Abdominal:     General: There is no distension.     Palpations: Abdomen is  soft.  Musculoskeletal:        General: Normal range of motion.     Cervical back: Normal range of motion.  Skin:    General: Skin is warm and dry.  Neurological:     Mental Status: He is alert.      UC Treatments / Results  Labs (all labs ordered are listed, but only abnormal results are displayed) Labs Reviewed - No data to display  EKG   Radiology No results found.  Procedures Procedures (including critical Blanchard time)  Medications Ordered in UC Medications - No data to display  Initial Impression / Assessment and Plan / UC Course  I have reviewed the triage vital signs and the nursing notes.  Pertinent labs & imaging results that were available during my Blanchard of the patient were reviewed by me and considered in my medical decision making (see chart for details).     Final Clinical Impressions(s) / UC Diagnoses   Final diagnoses:  Acute bronchitis, unspecified organism     Discharge Instructions      Take the prednisone  once a day for 5 days Take the doxycycline  2 times a day Make sure you are drinking lots of water Take all your medicine with food I have given you a refill for the doxycycline  in case it is needed Follow-up with your primary Blanchard doctor   ED Prescriptions     Medication  Sig Dispense Auth. Provider   predniSONE  (DELTASONE ) 50 MG tablet Take once a day for 5 days.  Take with food 5 tablet Maranda Jamee Jacob, MD   doxycycline  (VIBRAMYCIN ) 100 MG capsule Take 1 capsule (100 mg total) by mouth 2 (two) times daily. 20 capsule Maranda Jamee Jacob, MD      PDMP not reviewed this encounter.   Maranda Jamee Jacob, MD 04/12/24 [phone]

## 2024-04-12 NOTE — ED Triage Notes (Signed)
 Has c/o cough x weeks, has gotten better but 3-4 weeks ago started coming back. Was tx with abx and steroid which cleared up the cough initially. No otc meds.

## 2024-04-24 ENCOUNTER — Ambulatory Visit

## 2024-04-24 ENCOUNTER — Ambulatory Visit
Admission: EM | Admit: 2024-04-24 | Discharge: 2024-04-24 | Disposition: A | Discharge status: Home / Self Care | Attending: Family Medicine | Admitting: Family Medicine

## 2024-04-24 ENCOUNTER — Encounter: Payer: Self-pay | Admitting: Emergency Medicine

## 2024-04-24 DIAGNOSIS — K5909 Other constipation: Secondary | ICD-10-CM | POA: Diagnosis not present

## 2024-04-24 DIAGNOSIS — K5900 Constipation, unspecified: Secondary | ICD-10-CM | POA: Diagnosis not present

## 2024-04-24 NOTE — ED Triage Notes (Signed)
 Patient has a history of bowel obstructions and started feeling this way x 4 days ago.  Patient has limited what he's eating to help prevent if it is an obstruction.  Denies any nausea or vomiting.  Patient has had BM's.

## 2024-04-24 NOTE — ED Provider Notes (Signed)
 Jacob Blanchard [facility]ARE    CSN: [id number] Arrival date & time: 04/24/24  1334      History   Chief Complaint Chief Complaint  Patient presents with   Concern for Bowel Obstruction    HPI Jacob Blanchard is a 68 y.o. male.   HPI 68 year old male presents with concern for possible bowel obstruction.  Patient reports not having bowel movement for 4 days.  Patient denies abdominal pain, nausea or vomiting currently PMH significant for chronic constipation, GERD, and anxiety.  Past Medical History:  Diagnosis Date   Anxiety    prn xanax  started 01/2019->self d/c'd in fall/winter 2020   Barrett's esophagus    Bx+ 2007, 2009, and 2020.   Chronic constipation    GI->Linzess  trial 05/2019->helpful   Contact dermatitis and other eczema, due to unspecified cause    +scarring   Diverticulosis 2021   noted on CT abd 06/2020   Epididymal cyst 06/2018   Bilat: small on R, but 4 cm on left  Asymptomatic.   GERD (gastroesophageal reflux disease)    History of adenomatous polyp of colon 08/10/2006; 01/2020   2007 and 12/2018; 01/2020, 10/2023, recall 2030.   Hyperlipidemia    Incisional hernia    Left foot pain    ? Arch strain.  Some numbness to suggest possible tarsal tunnel syndrome vs lumbar radiculitis (sports med MD).  Responded very well to custom orthotic.   Migraine syndrome    verapamil prophylaxis, cluster migraine   Osteoporosis    fosamax  started 2022   Pseudophakia of both eyes    Right wrist pain    04/2023 Sprain, scapholunate widening on f/u xray 06/2023-->ref ortho   Seasonal and perennial allergic rhinitis    vaccine/injection therapy helpful   Small bowel obstruction (HCC) Fall of 2019   surgery->lysis of adhesions   Vertebral compression fracture (HCC) 05/2019   T11 and T12 and L 1, minimal trauma.  Dr. ONEIDA put him on fosamax .    Patient Active Problem List   Diagnosis Date Noted   History of small bowel obstruction 09/08/2023   Arthritis of right wrist  07/22/2023   Dupuytren's contracture of both hands 03/21/2021   Presbyopia 09/03/2020   Generalized abdominal pain 05/29/2020   History of adenomatous polyp of colon 12/14/2019   Umbilical hernia without obstruction and without gangrene 12/14/2019   Osteoporosis 05/24/2019   Lumbar spondylosis 05/12/2019   Thoracic compression fracture (HCC) 05/12/2019   Chronic constipation 05/12/2019   Partial bowel obstruction (HCC) 02/19/2019   Left foot pain 01/21/2017   Maxillary sinusitis 07/19/2015   Chronic asthma 10/03/2012   Seasonal and perennial allergic rhinitis 06/05/2008   GERD 06/05/2008   Eczema 06/05/2008   Fatigue 02/21/2008    Past Surgical History:  Procedure Laterality Date   ABDOMINAL EXPLORATION SURGERY  Fall 2019   SBO->lysis of adhesions   COLONOSCOPY W/ POLYPECTOMY  08/10/06; 12/2018   Several adenomatous polyps 2007 and 2020; 01-25-2020. Jan 2025 adenoma-->recall 2030.   DEXA  05/24/2019   Dr. Loran -2.4 + vert comp fx->fosamax  started.  06/2021 T score -1.8   ESOPHAGOGASTRODUODENOSCOPY  2007 and 07/20/08;12/2018   GERD, BARRET's esoph (Dr. Abran). Same 12/2018. Same 10/2023.  No further f/u EGD needed per GI.   INCISIONAL HERNIA REPAIR N/A 02/08/2020   Procedure: LAPAROSCOPIC INCISIONAL HERNIA REPAIR WITH MESH;  Surgeon: Kinsinger, Herlene Righter, MD;  Location: [facility];  Service: General;  Laterality: N/A;   INTRAOCULAR LENS INSERTION  09/03/2020  OU   LE venous doppler u/s  04/19/2015   L leg NORMAL   right knee arthroscopy  1980s   Torn meniscus (Dr. Jane)       Home Medications    Prior to Admission medications   Medication Sig Start Date End Date Taking? Authorizing Provider  alendronate  (FOSAMAX ) 70 MG tablet TAKE 1 TABLET (70 MG TOTAL) BY MOUTH EVERY 7 DAYS WITH FULL GLASS WATER ON EMPTY STOMACH 06/21/23  Yes Curtis Debby PARAS, MD  atorvastatin  (LIPITOR) 40 MG tablet Take 1 tablet (40 mg total) by mouth daily. 12/28/23  Yes  McGowen, Aleene DEL, MD  Calcium  Carbonate-Vitamin D3 (CALCIUM  + VITAMIN D3) 600-400 MG-UNIT TABS Take 1 tablet by mouth 2 (two) times daily. 03/21/21  Yes Curtis Debby PARAS, MD  cyclobenzaprine  (FLEXERIL ) 10 MG tablet TAKE 1 TABLET BY MOUTH TWICE A DAY AS NEEDED 05/12/22  Yes McGowen, Aleene DEL, MD  dicyclomine  (BENTYL ) 10 MG capsule TAKE 1 CAPSULE BY MOUTH TWICE A DAY 12/07/23  Yes Zehr, Jessica D, PA-C  doxycycline  (VIBRAMYCIN ) 100 MG capsule Take 1 capsule (100 mg total) by mouth 2 (two) times daily. 04/12/24  Yes Maranda Jamee Jacob, MD  fluticasone  (FLONASE ) 50 MCG/ACT nasal spray SPRAY 2 SPRAYS INTO EACH NOSTRIL EVERY DAY 12/28/23  Yes McGowen, Aleene DEL, MD  ibuprofen  (ADVIL ) 200 MG tablet Take 4 tablets (800 mg total) by mouth every 8 (eight) hours as needed. 07/09/22  Yes Curtis Debby PARAS, MD  lansoprazole  (PREVACID ) 30 MG capsule Take 1 capsule (30 mg total) by mouth 2 (two) times daily. 12/28/23  Yes McGowen, Aleene DEL, MD  linaclotide  (LINZESS ) 290 MCG CAPS capsule TAKE 1 CAPSULE BY MOUTH DAILY BEFORE BREAKFAST. 09/08/23  Yes Zehr, Jessica D, PA-C  polyethylene glycol powder (GLYCOLAX/MIRALAX) 17 GM/SCOOP powder Take by mouth.   Yes [provider]  predniSONE  (DELTASONE ) 50 MG tablet Take once a day for 5 days.  Take with food 04/12/24  Yes Maranda Jamee Jacob, MD  verapamil (CALAN) 120 MG tablet Take 60 mg by mouth once. For migraines 06/19/19  Yes [provider]    Family History Family History  Problem Relation Age of Onset   Other Father        chemical PNA siphoning gas   Healthy Brother    Colon cancer Neg Hx    Esophageal cancer Neg Hx    Rectal cancer Neg Hx    Stomach cancer Neg Hx     Social History Social History   Tobacco Use   Smoking status: Former    Current packs/day: 0.00    Average packs/day: 1 pack/day for 10.0 years (10.0 ttl pk-yrs)    Types: Cigarettes    Start date: 07/07/1983    Quit date: 07/06/1993    Years since quitting: 30.8    Smokeless tobacco: Never  Vaping Use   Vaping status: Never Used  Substance Use Topics   Alcohol use: Yes    Comment: 1 or 2 daily   Drug use: No     Allergies   Oxycodone   Review of Systems Review of Systems   Physical Exam Triage Vital Signs ED Triage Vitals  Encounter Vitals Group     BP 04/24/24 1404 (!) 143/90     Girls Systolic BP Percentile --      Girls Diastolic BP Percentile --      Boys Systolic BP Percentile --      Boys Diastolic BP Percentile --  Pulse Rate 04/24/24 1404 86     Resp 04/24/24 1404 18     Temp 04/24/24 1404 98.2 F (36.8 C)     Temp Source 04/24/24 1404 Oral     SpO2 04/24/24 1404 98 %     Weight --      Height --      Head Circumference --      Peak Flow --      Pain Score 04/24/24 1405 8     Pain Loc --      Pain Education --      Exclude from Growth Chart --    No data found.  Updated Vital Signs BP (!) 143/90 (BP Location: Right Arm)   Pulse 86   Temp 98.2 F (36.8 C) (Oral)   Resp 18   SpO2 98%    Physical Exam Vitals and nursing note reviewed.  Constitutional:      General: He is not in acute distress.    Appearance: Normal appearance. He is normal weight. He is not ill-appearing.  HENT:     Head: Normocephalic and atraumatic.     Mouth/Throat:     Mouth: Mucous membranes are moist.     Pharynx: Oropharynx is clear.  Eyes:     Extraocular Movements: Extraocular movements intact.     Conjunctiva/sclera: Conjunctivae normal.     Pupils: Pupils are equal, round, and reactive to light.  Cardiovascular:     Rate and Rhythm: Normal rate and regular rhythm.     Pulses: Normal pulses.     Heart sounds: Normal heart sounds.  Pulmonary:     Effort: Pulmonary effort is normal.     Breath sounds: Normal breath sounds. No wheezing, rhonchi or rales.  Musculoskeletal:        General: Normal range of motion.  Skin:    General: Skin is warm and dry.  Neurological:     General: No focal deficit present.     Mental  Status: He is alert and oriented to person, place, and time. Mental status is at baseline.  Psychiatric:        Mood and Affect: Mood normal.        Behavior: Behavior normal.      UC Treatments / Results  Labs (all labs ordered are listed, but only abnormal results are displayed) Labs Reviewed - No data to display  EKG   Radiology DG Abdomen 1 View Result Date: 04/24/2024 CLINICAL DATA:  Constipation. EXAM: ABDOMEN - 1 VIEW COMPARISON:  09/02/2021. FINDINGS: Gas is seen in nondistended colon. Minimal stool burden. No small bowel dilatation. Visualized lung bases are clear. Levoconvex scoliosis of the thoracolumbar spine. IMPRESSION: No acute findings.  Minimal stool burden. Electronically Signed   By: Newell Eke M.D.   On: 04/24/2024 15:21    Procedures Procedures (including critical care time)  Medications Ordered in UC Medications - No data to display  Initial Impression / Assessment and Plan / UC Course  I have reviewed the triage vital signs and the nursing notes.  Pertinent labs & imaging results that were available during my care of the patient were reviewed by me and considered in my medical decision making (see chart for details).     MDM: Chronic constipation-advised patient of x-ray results with hardcopy and images provided. Advised patient of KUB x-ray results with hardcopy and images provided to patient at time of discharge.  Advised if symptoms worsen and/or unresolved please follow-up with your PCP or GI  for further evaluation.  Patient discharged home, hemodynamically stable. Final Clinical Impressions(s) / UC Diagnoses   Final diagnoses:  Chronic constipation     Discharge Instructions      Advised patient of KUB x-ray results with hardcopy and images provided to patient at time of discharge.  Advised if symptoms worsen and/or unresolved please follow-up with your PCP or GI for further evaluation.     ED Prescriptions   None    PDMP not  reviewed this encounter.   Teddy Sharper, FNP 04/24/24 1559

## 2024-04-24 NOTE — Discharge Instructions (Addendum)
 Advised patient of KUB x-ray results with hardcopy and images provided to patient at time of discharge.  Advised if symptoms worsen and/or unresolved please follow-up with your PCP or GI for further evaluation.

## 2024-05-16 ENCOUNTER — Other Ambulatory Visit (INDEPENDENT_AMBULATORY_CARE_PROVIDER_SITE_OTHER)

## 2024-05-16 ENCOUNTER — Ambulatory Visit: Payer: Self-pay | Admitting: Family Medicine

## 2024-05-16 DIAGNOSIS — E875 Hyperkalemia: Secondary | ICD-10-CM

## 2024-05-16 DIAGNOSIS — M8000XD Age-related osteoporosis with current pathological fracture, unspecified site, subsequent encounter for fracture with routine healing: Secondary | ICD-10-CM

## 2024-05-16 LAB — BASIC METABOLIC PANEL WITH GFR
BUN: 7 mg/dL (ref 6–23)
CO2: 29 meq/L (ref 19–32)
Calcium: 8.9 mg/dL (ref 8.4–10.5)
Chloride: 98 meq/L (ref 96–112)
Creatinine, Ser: 0.69 mg/dL (ref 0.40–1.50)
GFR: 94.3 mL/min (ref >60.00)
Glucose, Bld: 85 mg/dL (ref 70–99)
Potassium: 5.4 meq/L — ABNORMAL HIGH (ref 3.5–5.1)
Sodium: 134 meq/L — ABNORMAL LOW (ref 135–145)

## 2024-05-16 NOTE — Addendum Note (Signed)
 Addended by: FLETA CARE D on: 05/16/2024 08:58 AM   Modules accepted: Orders

## 2024-05-17 ENCOUNTER — Ambulatory Visit

## 2024-05-17 DIAGNOSIS — M8000XD Age-related osteoporosis with current pathological fracture, unspecified site, subsequent encounter for fracture with routine healing: Secondary | ICD-10-CM | POA: Diagnosis not present

## 2024-05-17 DIAGNOSIS — M8589 Other specified disorders of bone density and structure, multiple sites: Secondary | ICD-10-CM | POA: Diagnosis not present

## 2024-05-17 DIAGNOSIS — Z13820 Encounter for screening for osteoporosis: Secondary | ICD-10-CM | POA: Diagnosis not present

## 2024-05-17 DIAGNOSIS — M8580 Other specified disorders of bone density and structure, unspecified site: Secondary | ICD-10-CM | POA: Diagnosis not present

## 2024-05-17 DIAGNOSIS — Z7952 Long term (current) use of systemic steroids: Secondary | ICD-10-CM | POA: Diagnosis not present

## 2024-05-17 DIAGNOSIS — Z87310 Personal history of (healed) osteoporosis fracture: Secondary | ICD-10-CM | POA: Diagnosis not present

## 2024-05-19 ENCOUNTER — Ambulatory Visit: Payer: Self-pay | Admitting: Sports Medicine

## 2024-05-21 ENCOUNTER — Other Ambulatory Visit: Payer: Self-pay | Admitting: Sports Medicine

## 2024-05-21 DIAGNOSIS — M8000XD Age-related osteoporosis with current pathological fracture, unspecified site, subsequent encounter for fracture with routine healing: Secondary | ICD-10-CM

## 2024-06-06 ENCOUNTER — Encounter: Payer: Self-pay | Admitting: Sports Medicine

## 2024-07-17 ENCOUNTER — Telehealth: Payer: Self-pay

## 2024-07-17 NOTE — Telephone Encounter (Signed)
 Pls do letter stating that he has never used tobacco products.

## 2024-07-17 NOTE — Telephone Encounter (Signed)
 Copied from CRM [id number]. Topic: General - Other >> Jul 17, 2024 10:57 AM Ashley R wrote: Reason for CRM: Need a note for life insurance company that he has not used tobacco in the last year plus.  Please advise. Did not find any prior letters such as the one requested already in chart

## 2024-07-17 NOTE — Telephone Encounter (Signed)
 LVM for pt to return call regarding letter. Letter currently pending.   Note: If pt returns call, please confirm if he would like letter e-mailed, faxed, or to pick up in person.

## 2024-07-18 NOTE — Telephone Encounter (Signed)
 Left detailed message advising patient we sent the letter to the e-mail provided.

## 2024-07-18 NOTE — Telephone Encounter (Signed)
 Reason for CRM: Patient called back about telephone encounter today but I was unable to add a note to it - he would prefer the letter re: tobacco use be sent to his email: [email or web address]

## 2024-09-06 ENCOUNTER — Other Ambulatory Visit: Payer: Self-pay | Admitting: Family Medicine

## 2024-09-07 ENCOUNTER — Other Ambulatory Visit: Payer: Self-pay | Admitting: Family Medicine

## 2024-10-12 ENCOUNTER — Telehealth: Payer: Self-pay

## 2024-10-12 DIAGNOSIS — M8000XD Age-related osteoporosis with current pathological fracture, unspecified site, subsequent encounter for fracture with routine healing: Secondary | ICD-10-CM

## 2024-10-12 NOTE — Telephone Encounter (Signed)
 Fosamax  was previously refilled by Dr. Curtis. Please advise

## 2024-10-12 NOTE — Telephone Encounter (Signed)
 Copied from CRM [id number]. Topic: Clinical - Medication Question >> Oct 12, 2024 11:54 AM Jacob Blanchard wrote: Reason for CRM: Pt called in stating that he needs a refill on medication alendronate  (FOSAMAX ) 70 MG tablet. Per pt chart medication was prescribed by a different provider and not Dr.McGowen. I advised pt it may be best to contact prescribing providers office for refills. Pt stated the prescribing provider is no longer at previous office so when he contacted new office pt was basically told he would have to be seen as a new patient. Pt is unsure what to do and is requesting a callback regarding this.

## 2024-10-13 ENCOUNTER — Other Ambulatory Visit: Payer: Self-pay | Admitting: Family Medicine

## 2024-10-13 MED ORDER — ALENDRONATE SODIUM 70 MG PO TABS: 70.0000 mg | ORAL_TABLET | ORAL | 3 refills | Status: DC

## 2024-10-13 MED ORDER — ALENDRONATE SODIUM 70 MG PO TABS: 70.0000 mg | ORAL_TABLET | ORAL | 3 refills | Status: AC

## 2024-10-13 NOTE — Telephone Encounter (Signed)
 Rx sent

## 2024-12-01 ENCOUNTER — Ambulatory Visit: Admitting: Nurse Practitioner

## 2024-12-28 ENCOUNTER — Encounter: Admitting: Family Medicine
